# Patient Record
Sex: Male | Born: 1965 | Race: White | Hispanic: No | Marital: Married | Smoking: Never smoker
Health system: Southern US, Community
[De-identification: ages and names within clinical notes are randomized; demographics above are authoritative.]

## PROBLEM LIST (undated history)

## (undated) DIAGNOSIS — N200 Calculus of kidney: Secondary | ICD-10-CM

## (undated) DIAGNOSIS — I10 Essential (primary) hypertension: Secondary | ICD-10-CM

## (undated) DIAGNOSIS — R790 Abnormal level of blood mineral: Secondary | ICD-10-CM

## (undated) DIAGNOSIS — F5081 Binge eating disorder: Secondary | ICD-10-CM

## (undated) DIAGNOSIS — F909 Attention-deficit hyperactivity disorder, unspecified type: Secondary | ICD-10-CM

## (undated) DIAGNOSIS — E559 Vitamin D deficiency, unspecified: Secondary | ICD-10-CM

## (undated) DIAGNOSIS — F50819 Binge eating disorder, unspecified: Secondary | ICD-10-CM

## (undated) DIAGNOSIS — G4700 Insomnia, unspecified: Secondary | ICD-10-CM

## (undated) DIAGNOSIS — K219 Gastro-esophageal reflux disease without esophagitis: Secondary | ICD-10-CM

## (undated) DIAGNOSIS — E785 Hyperlipidemia, unspecified: Secondary | ICD-10-CM

## (undated) HISTORY — DX: Attention-deficit hyperactivity disorder, unspecified type: F90.9

## (undated) HISTORY — PX: WISDOM TOOTH EXTRACTION: SHX21

## (undated) HISTORY — DX: Abnormal level of blood mineral: R79.0

## (undated) HISTORY — DX: Essential (primary) hypertension: I10

## (undated) HISTORY — DX: Hyperlipidemia, unspecified: E78.5

## (undated) HISTORY — DX: Binge eating disorder, unspecified: F50.819

## (undated) HISTORY — DX: Vitamin D deficiency, unspecified: E55.9

## (undated) HISTORY — DX: Insomnia, unspecified: G47.00

## (undated) HISTORY — PX: WRIST SURGERY: SHX841

## (undated) HISTORY — DX: Gastro-esophageal reflux disease without esophagitis: K21.9

## (undated) HISTORY — DX: Binge eating disorder: F50.81

## (undated) HISTORY — DX: Calculus of kidney: N20.0

---

## 1988-03-27 DIAGNOSIS — F5081 Binge eating disorder: Secondary | ICD-10-CM | POA: Insufficient documentation

## 1998-01-31 ENCOUNTER — Emergency Department (HOSPITAL_COMMUNITY): Admission: EM | Admit: 1998-01-31 | Discharge: 1998-01-31 | Payer: Self-pay | Admitting: Internal Medicine

## 2000-03-22 ENCOUNTER — Encounter: Payer: Self-pay | Admitting: Family Medicine

## 2000-03-22 ENCOUNTER — Encounter: Admission: RE | Admit: 2000-03-22 | Discharge: 2000-03-22 | Payer: Self-pay | Admitting: Family Medicine

## 2000-05-15 ENCOUNTER — Ambulatory Visit (HOSPITAL_BASED_OUTPATIENT_CLINIC_OR_DEPARTMENT_OTHER): Admission: RE | Admit: 2000-05-15 | Discharge: 2000-05-15 | Payer: Self-pay | Admitting: Otolaryngology

## 2001-10-08 ENCOUNTER — Encounter: Payer: Self-pay | Admitting: Family Medicine

## 2001-10-08 ENCOUNTER — Encounter: Admission: RE | Admit: 2001-10-08 | Discharge: 2001-10-08 | Payer: Self-pay | Admitting: Family Medicine

## 2004-01-27 ENCOUNTER — Ambulatory Visit: Payer: Self-pay | Admitting: Family Medicine

## 2004-03-23 ENCOUNTER — Ambulatory Visit: Payer: Self-pay | Admitting: Family Medicine

## 2004-05-03 ENCOUNTER — Ambulatory Visit: Payer: Self-pay | Admitting: Family Medicine

## 2004-08-04 ENCOUNTER — Ambulatory Visit: Payer: Self-pay | Admitting: Family Medicine

## 2004-10-17 ENCOUNTER — Ambulatory Visit: Payer: Self-pay | Admitting: Family Medicine

## 2004-11-02 ENCOUNTER — Ambulatory Visit: Payer: Self-pay | Admitting: Family Medicine

## 2004-11-09 ENCOUNTER — Ambulatory Visit: Payer: Self-pay | Admitting: Family Medicine

## 2004-11-09 ENCOUNTER — Ambulatory Visit: Payer: Self-pay

## 2004-12-21 ENCOUNTER — Ambulatory Visit: Payer: Self-pay | Admitting: Family Medicine

## 2005-01-10 ENCOUNTER — Ambulatory Visit: Payer: Self-pay | Admitting: Family Medicine

## 2005-01-31 ENCOUNTER — Ambulatory Visit: Payer: Self-pay | Admitting: Family Medicine

## 2005-03-10 ENCOUNTER — Ambulatory Visit: Payer: Self-pay | Admitting: Family Medicine

## 2005-03-24 ENCOUNTER — Ambulatory Visit: Payer: Self-pay | Admitting: Family Medicine

## 2005-04-28 ENCOUNTER — Ambulatory Visit: Payer: Self-pay | Admitting: Family Medicine

## 2005-06-01 ENCOUNTER — Ambulatory Visit: Payer: Self-pay | Admitting: Family Medicine

## 2005-08-07 ENCOUNTER — Ambulatory Visit: Payer: Self-pay | Admitting: Family Medicine

## 2005-11-20 ENCOUNTER — Ambulatory Visit: Payer: Self-pay | Admitting: Family Medicine

## 2005-12-06 ENCOUNTER — Ambulatory Visit: Payer: Self-pay | Admitting: Family Medicine

## 2005-12-25 ENCOUNTER — Ambulatory Visit: Payer: Self-pay | Admitting: Family Medicine

## 2006-01-08 ENCOUNTER — Ambulatory Visit: Payer: Self-pay | Admitting: Internal Medicine

## 2006-02-07 ENCOUNTER — Ambulatory Visit: Payer: Self-pay | Admitting: Family Medicine

## 2006-02-07 LAB — CONVERTED CEMR LAB
ALT: 89 units/L — ABNORMAL HIGH (ref 0–40)
AST: 46 units/L — ABNORMAL HIGH (ref 0–37)
Albumin: 4 g/dL (ref 3.5–5.2)
Alkaline Phosphatase: 58 units/L (ref 39–117)
BUN: 12 mg/dL (ref 6–23)
CO2: 30 meq/L (ref 19–32)
Calcium: 9.2 mg/dL (ref 8.4–10.5)
Chloride: 105 meq/L (ref 96–112)
Creatinine, Ser: 1.3 mg/dL (ref 0.4–1.5)
GFR calc non Af Amer: 66 mL/min
Glomerular Filtration Rate, Af Am: 79 mL/min/{1.73_m2}
Glucose, Bld: 104 mg/dL — ABNORMAL HIGH (ref 70–99)
HCT: 50.2 % (ref 39.0–52.0)
Hemoglobin: 16.8 g/dL (ref 13.0–17.0)
MCHC: 33.5 g/dL (ref 30.0–36.0)
MCV: 90.8 fL (ref 78.0–100.0)
Platelets: 268 10*3/uL (ref 150–400)
Potassium: 3.8 meq/L (ref 3.5–5.1)
RBC: 5.53 M/uL (ref 4.22–5.81)
RDW: 12.1 % (ref 11.5–14.6)
Sodium: 143 meq/L (ref 135–145)
TSH: 1.16 microintl units/mL (ref 0.35–5.50)
Total Bilirubin: 0.8 mg/dL (ref 0.3–1.2)
Total Protein: 7.1 g/dL (ref 6.0–8.3)
WBC: 6.7 10*3/uL (ref 4.5–10.5)

## 2006-04-26 ENCOUNTER — Ambulatory Visit: Payer: Self-pay | Admitting: Family Medicine

## 2006-06-12 ENCOUNTER — Ambulatory Visit: Payer: Self-pay | Admitting: Family Medicine

## 2006-08-22 ENCOUNTER — Ambulatory Visit: Payer: Self-pay | Admitting: Family Medicine

## 2006-08-22 LAB — CONVERTED CEMR LAB
ALT: 73 units/L — ABNORMAL HIGH (ref 0–40)
AST: 44 units/L — ABNORMAL HIGH (ref 0–37)
Albumin: 4.3 g/dL (ref 3.5–5.2)
Alkaline Phosphatase: 57 units/L (ref 39–117)
BUN: 12 mg/dL (ref 6–23)
Basophils Absolute: 0 10*3/uL (ref 0.0–0.1)
Basophils Relative: 0.1 % (ref 0.0–1.0)
Bilirubin, Direct: 0.1 mg/dL (ref 0.0–0.3)
CO2: 26 meq/L (ref 19–32)
Calcium: 9.3 mg/dL (ref 8.4–10.5)
Chloride: 106 meq/L (ref 96–112)
Cholesterol: 190 mg/dL (ref 0–200)
Creatinine, Ser: 1.2 mg/dL (ref 0.4–1.5)
Eosinophils Absolute: 0.1 10*3/uL (ref 0.0–0.6)
Eosinophils Relative: 1.7 % (ref 0.0–5.0)
GFR calc Af Amer: 87 mL/min
GFR calc non Af Amer: 72 mL/min
Glucose, Bld: 108 mg/dL — ABNORMAL HIGH (ref 70–99)
HCT: 50 % (ref 39.0–52.0)
HDL: 35.7 mg/dL — ABNORMAL LOW (ref >39.0)
Hemoglobin: 17.1 g/dL — ABNORMAL HIGH (ref 13.0–17.0)
Hgb A1c MFr Bld: 6.5 % — ABNORMAL HIGH (ref 4.6–6.0)
LDL Cholesterol: 125 mg/dL — ABNORMAL HIGH (ref 0–99)
Lymphocytes Relative: 40.7 % (ref 12.0–46.0)
MCHC: 34.3 g/dL (ref 30.0–36.0)
MCV: 90.7 fL (ref 78.0–100.0)
Monocytes Absolute: 0.4 10*3/uL (ref 0.2–0.7)
Monocytes Relative: 7.9 % (ref 3.0–11.0)
Neutro Abs: 2.6 10*3/uL (ref 1.4–7.7)
Neutrophils Relative %: 49.6 % (ref 43.0–77.0)
Platelets: 206 10*3/uL (ref 150–400)
Potassium: 3.9 meq/L (ref 3.5–5.1)
RBC: 5.51 M/uL (ref 4.22–5.81)
RDW: 12.4 % (ref 11.5–14.6)
Sodium: 139 meq/L (ref 135–145)
TSH: 2.42 microintl units/mL (ref 0.35–5.50)
Total Bilirubin: 1.1 mg/dL (ref 0.3–1.2)
Total CHOL/HDL Ratio: 5.3
Total Protein: 7.5 g/dL (ref 6.0–8.3)
Triglycerides: 147 mg/dL (ref 0–149)
VLDL: 29 mg/dL (ref 0–40)
WBC: 5.3 10*3/uL (ref 4.5–10.5)

## 2006-08-29 ENCOUNTER — Ambulatory Visit: Payer: Self-pay | Admitting: Family Medicine

## 2006-08-29 DIAGNOSIS — H6990 Unspecified Eustachian tube disorder, unspecified ear: Secondary | ICD-10-CM | POA: Insufficient documentation

## 2006-08-29 DIAGNOSIS — Z8659 Personal history of other mental and behavioral disorders: Secondary | ICD-10-CM

## 2006-08-29 DIAGNOSIS — H6980 Other specified disorders of Eustachian tube, unspecified ear: Secondary | ICD-10-CM

## 2006-08-29 DIAGNOSIS — Z9189 Other specified personal risk factors, not elsewhere classified: Secondary | ICD-10-CM

## 2006-08-29 DIAGNOSIS — J45909 Unspecified asthma, uncomplicated: Secondary | ICD-10-CM | POA: Insufficient documentation

## 2006-10-17 ENCOUNTER — Telehealth: Payer: Self-pay | Admitting: *Deleted

## 2006-10-31 ENCOUNTER — Ambulatory Visit: Payer: Self-pay | Admitting: Family Medicine

## 2007-01-18 ENCOUNTER — Encounter: Payer: Self-pay | Admitting: Family Medicine

## 2007-02-20 ENCOUNTER — Ambulatory Visit: Payer: Self-pay | Admitting: Family Medicine

## 2007-02-20 DIAGNOSIS — J209 Acute bronchitis, unspecified: Secondary | ICD-10-CM

## 2007-04-22 ENCOUNTER — Telehealth: Payer: Self-pay | Admitting: Family Medicine

## 2007-04-24 ENCOUNTER — Telehealth: Payer: Self-pay | Admitting: Family Medicine

## 2007-05-01 ENCOUNTER — Ambulatory Visit: Payer: Self-pay | Admitting: Family Medicine

## 2007-05-01 DIAGNOSIS — I152 Hypertension secondary to endocrine disorders: Secondary | ICD-10-CM | POA: Insufficient documentation

## 2007-05-01 DIAGNOSIS — I10 Essential (primary) hypertension: Secondary | ICD-10-CM

## 2007-05-01 DIAGNOSIS — E785 Hyperlipidemia, unspecified: Secondary | ICD-10-CM | POA: Insufficient documentation

## 2007-05-02 LAB — CONVERTED CEMR LAB
ALT: 57 units/L — ABNORMAL HIGH (ref 0–53)
AST: 43 units/L — ABNORMAL HIGH (ref 0–37)
Albumin: 4.4 g/dL (ref 3.5–5.2)
Alkaline Phosphatase: 53 units/L (ref 39–117)
BUN: 10 mg/dL (ref 6–23)
Basophils Absolute: 0 10*3/uL (ref 0.0–0.1)
Basophils Relative: 0.1 % (ref 0.0–1.0)
Bilirubin, Direct: 0.2 mg/dL (ref 0.0–0.3)
CO2: 29 meq/L (ref 19–32)
Calcium: 9.5 mg/dL (ref 8.4–10.5)
Chloride: 107 meq/L (ref 96–112)
Cholesterol: 176 mg/dL (ref 0–200)
Creatinine, Ser: 1.2 mg/dL (ref 0.4–1.5)
Eosinophils Absolute: 0.1 10*3/uL (ref 0.0–0.6)
Eosinophils Relative: 2.6 % (ref 0.0–5.0)
GFR calc Af Amer: 87 mL/min
GFR calc non Af Amer: 72 mL/min
Glucose, Bld: 96 mg/dL (ref 70–99)
HCT: 49.6 % (ref 39.0–52.0)
HDL: 38 mg/dL — ABNORMAL LOW (ref >39.0)
Hemoglobin: 16.8 g/dL (ref 13.0–17.0)
Hgb A1c MFr Bld: 6.3 % — ABNORMAL HIGH (ref 4.6–6.0)
LDL Cholesterol: 125 mg/dL — ABNORMAL HIGH (ref 0–99)
Lymphocytes Relative: 33.9 % (ref 12.0–46.0)
MCHC: 33.8 g/dL (ref 30.0–36.0)
MCV: 90.6 fL (ref 78.0–100.0)
Monocytes Absolute: 0.4 10*3/uL (ref 0.2–0.7)
Monocytes Relative: 7.2 % (ref 3.0–11.0)
Neutro Abs: 2.9 10*3/uL (ref 1.4–7.7)
Neutrophils Relative %: 56.2 % (ref 43.0–77.0)
Platelets: 219 10*3/uL (ref 150–400)
Potassium: 4.5 meq/L (ref 3.5–5.1)
RBC: 5.47 M/uL (ref 4.22–5.81)
RDW: 12.2 % (ref 11.5–14.6)
Sodium: 144 meq/L (ref 135–145)
TSH: 1.29 microintl units/mL (ref 0.35–5.50)
Total Bilirubin: 1.1 mg/dL (ref 0.3–1.2)
Total CHOL/HDL Ratio: 4.6
Total Protein: 7.1 g/dL (ref 6.0–8.3)
Triglycerides: 67 mg/dL (ref 0–149)
VLDL: 13 mg/dL (ref 0–40)
WBC: 5.2 10*3/uL (ref 4.5–10.5)

## 2007-05-08 ENCOUNTER — Ambulatory Visit: Payer: Self-pay | Admitting: Family Medicine

## 2007-05-08 DIAGNOSIS — IMO0002 Reserved for concepts with insufficient information to code with codable children: Secondary | ICD-10-CM

## 2007-06-14 ENCOUNTER — Ambulatory Visit: Payer: Self-pay | Admitting: Family Medicine

## 2007-06-19 ENCOUNTER — Ambulatory Visit: Payer: Self-pay | Admitting: Family Medicine

## 2007-06-24 ENCOUNTER — Encounter: Payer: Self-pay | Admitting: Family Medicine

## 2007-08-16 ENCOUNTER — Ambulatory Visit: Payer: Self-pay | Admitting: Family Medicine

## 2007-08-16 DIAGNOSIS — K625 Hemorrhage of anus and rectum: Secondary | ICD-10-CM | POA: Insufficient documentation

## 2007-09-18 ENCOUNTER — Encounter: Payer: Self-pay | Admitting: Family Medicine

## 2007-11-13 ENCOUNTER — Telehealth: Payer: Self-pay | Admitting: Family Medicine

## 2008-01-06 ENCOUNTER — Telehealth: Payer: Self-pay | Admitting: Family Medicine

## 2008-01-24 ENCOUNTER — Encounter: Payer: Self-pay | Admitting: Family Medicine

## 2008-01-27 ENCOUNTER — Encounter: Admission: RE | Admit: 2008-01-27 | Discharge: 2008-01-27 | Payer: Self-pay | Admitting: Family Medicine

## 2008-01-27 ENCOUNTER — Ambulatory Visit: Payer: Self-pay | Admitting: Family Medicine

## 2008-01-27 DIAGNOSIS — N453 Epididymo-orchitis: Secondary | ICD-10-CM

## 2008-02-13 ENCOUNTER — Encounter: Payer: Self-pay | Admitting: Family Medicine

## 2008-04-28 ENCOUNTER — Ambulatory Visit: Payer: Self-pay | Admitting: Family Medicine

## 2008-05-14 ENCOUNTER — Ambulatory Visit: Payer: Self-pay | Admitting: Family Medicine

## 2008-05-22 ENCOUNTER — Telehealth: Payer: Self-pay | Admitting: Family Medicine

## 2008-05-28 ENCOUNTER — Ambulatory Visit: Payer: Self-pay | Admitting: Family Medicine

## 2008-06-08 ENCOUNTER — Ambulatory Visit: Payer: Self-pay | Admitting: Family Medicine

## 2008-06-11 ENCOUNTER — Ambulatory Visit: Payer: Self-pay | Admitting: Internal Medicine

## 2008-06-18 ENCOUNTER — Ambulatory Visit: Payer: Self-pay | Admitting: Family Medicine

## 2008-06-24 ENCOUNTER — Telehealth: Payer: Self-pay | Admitting: Family Medicine

## 2008-07-16 ENCOUNTER — Ambulatory Visit: Payer: Self-pay | Admitting: Internal Medicine

## 2008-08-06 ENCOUNTER — Ambulatory Visit: Payer: Self-pay | Admitting: Family Medicine

## 2008-08-07 LAB — CONVERTED CEMR LAB
ALT: 47 units/L (ref 0–53)
AST: 31 units/L (ref 0–37)
Albumin: 3.8 g/dL (ref 3.5–5.2)
Alkaline Phosphatase: 56 units/L (ref 39–117)
BUN: 7 mg/dL (ref 6–23)
Basophils Absolute: 0 10*3/uL (ref 0.0–0.1)
Basophils Relative: 0.4 % (ref 0.0–3.0)
Bilirubin Urine: NEGATIVE
Bilirubin, Direct: 0.2 mg/dL (ref 0.0–0.3)
CO2: 28 meq/L (ref 19–32)
Calcium: 9.1 mg/dL (ref 8.4–10.5)
Chloride: 107 meq/L (ref 96–112)
Cholesterol: 217 mg/dL — ABNORMAL HIGH (ref 0–200)
Creatinine, Ser: 1 mg/dL (ref 0.4–1.5)
Direct LDL: 167.3 mg/dL
Eosinophils Absolute: 0.1 10*3/uL (ref 0.0–0.7)
Eosinophils Relative: 2.4 % (ref 0.0–5.0)
GFR calc non Af Amer: 87.53 mL/min (ref >60)
Glucose, Bld: 110 mg/dL — ABNORMAL HIGH (ref 70–99)
HCT: 49.8 % (ref 39.0–52.0)
HDL: 39.1 mg/dL (ref >39.00)
Hemoglobin, Urine: NEGATIVE
Hemoglobin: 17 g/dL (ref 13.0–17.0)
Hgb A1c MFr Bld: 6.1 % (ref 4.6–6.5)
Ketones, ur: NEGATIVE mg/dL
Leukocytes, UA: NEGATIVE
Lymphocytes Relative: 32.8 % (ref 12.0–46.0)
Lymphs Abs: 1.7 10*3/uL (ref 0.7–4.0)
MCHC: 34.2 g/dL (ref 30.0–36.0)
MCV: 92.5 fL (ref 78.0–100.0)
Monocytes Absolute: 0.4 10*3/uL (ref 0.1–1.0)
Monocytes Relative: 7.5 % (ref 3.0–12.0)
Neutro Abs: 3.1 10*3/uL (ref 1.4–7.7)
Neutrophils Relative %: 56.9 % (ref 43.0–77.0)
Nitrite: NEGATIVE
PSA: 0.26 ng/mL (ref 0.10–4.00)
Platelets: 206 10*3/uL (ref 150.0–400.0)
Potassium: 3.8 meq/L (ref 3.5–5.1)
RBC: 5.39 M/uL (ref 4.22–5.81)
RDW: 12.9 % (ref 11.5–14.6)
Sodium: 142 meq/L (ref 135–145)
Specific Gravity, Urine: 1.02 (ref 1.000–1.030)
TSH: 1.78 microintl units/mL (ref 0.35–5.50)
Total Bilirubin: 1.2 mg/dL (ref 0.3–1.2)
Total CHOL/HDL Ratio: 6
Total Protein, Urine: NEGATIVE mg/dL
Total Protein: 6.9 g/dL (ref 6.0–8.3)
Triglycerides: 87 mg/dL (ref 0.0–149.0)
Urine Glucose: NEGATIVE mg/dL
Urobilinogen, UA: 0.2 (ref 0.0–1.0)
VLDL: 17.4 mg/dL (ref 0.0–40.0)
WBC: 5.3 10*3/uL (ref 4.5–10.5)
pH: 6 (ref 5.0–8.0)

## 2008-08-08 ENCOUNTER — Telehealth: Payer: Self-pay | Admitting: Family Medicine

## 2008-08-20 ENCOUNTER — Ambulatory Visit: Payer: Self-pay | Admitting: Family Medicine

## 2008-08-21 ENCOUNTER — Encounter: Payer: Self-pay | Admitting: Family Medicine

## 2008-08-31 ENCOUNTER — Telehealth: Payer: Self-pay | Admitting: Family Medicine

## 2008-11-02 ENCOUNTER — Ambulatory Visit: Payer: Self-pay | Admitting: Family Medicine

## 2008-11-02 DIAGNOSIS — M109 Gout, unspecified: Secondary | ICD-10-CM | POA: Insufficient documentation

## 2008-11-20 ENCOUNTER — Ambulatory Visit: Payer: Self-pay | Admitting: Family Medicine

## 2008-12-31 ENCOUNTER — Ambulatory Visit: Payer: Self-pay | Admitting: Family Medicine

## 2009-01-05 LAB — CONVERTED CEMR LAB
ALT: 31 units/L (ref 0–53)
AST: 24 units/L (ref 0–37)
Albumin: 4.1 g/dL (ref 3.5–5.2)
Alkaline Phosphatase: 54 units/L (ref 39–117)
Bilirubin, Direct: 0.1 mg/dL (ref 0.0–0.3)
Cholesterol: 227 mg/dL — ABNORMAL HIGH (ref 0–200)
Direct LDL: 176.6 mg/dL
HDL: 46.4 mg/dL (ref >39.00)
Hgb A1c MFr Bld: 5.9 % (ref 4.6–6.5)
Total Bilirubin: 1.1 mg/dL (ref 0.3–1.2)
Total CHOL/HDL Ratio: 5
Total Protein: 7.3 g/dL (ref 6.0–8.3)
Triglycerides: 73 mg/dL (ref 0.0–149.0)
Uric Acid, Serum: 8.6 mg/dL — ABNORMAL HIGH (ref 4.0–7.8)
VLDL: 14.6 mg/dL (ref 0.0–40.0)

## 2009-03-04 ENCOUNTER — Encounter: Admission: RE | Admit: 2009-03-04 | Discharge: 2009-03-24 | Payer: Self-pay | Admitting: Family Medicine

## 2009-03-10 ENCOUNTER — Encounter (INDEPENDENT_AMBULATORY_CARE_PROVIDER_SITE_OTHER): Payer: Self-pay | Admitting: *Deleted

## 2009-03-22 ENCOUNTER — Telehealth: Payer: Self-pay | Admitting: Family Medicine

## 2009-03-23 ENCOUNTER — Ambulatory Visit: Payer: Self-pay | Admitting: Family Medicine

## 2009-08-06 ENCOUNTER — Encounter: Payer: Self-pay | Admitting: Family Medicine

## 2009-08-06 DIAGNOSIS — E785 Hyperlipidemia, unspecified: Secondary | ICD-10-CM | POA: Insufficient documentation

## 2009-08-06 DIAGNOSIS — J4520 Mild intermittent asthma, uncomplicated: Secondary | ICD-10-CM | POA: Insufficient documentation

## 2009-08-06 DIAGNOSIS — E1169 Type 2 diabetes mellitus with other specified complication: Secondary | ICD-10-CM | POA: Insufficient documentation

## 2009-09-17 ENCOUNTER — Ambulatory Visit: Payer: Self-pay | Admitting: Family Medicine

## 2010-04-14 ENCOUNTER — Ambulatory Visit
Admission: RE | Admit: 2010-04-14 | Discharge: 2010-04-14 | Payer: Self-pay | Source: Home / Self Care | Attending: Family Medicine | Admitting: Family Medicine

## 2010-04-26 NOTE — Assessment & Plan Note (Signed)
Summary: COUGH, CONGESTION // RS   Vital Signs:  Patient profile:   45 year old male Weight:      303 pounds BMI:     39.04 O2 Sat:      95 % Temp:     98.7 degrees F oral BP sitting:   136 / 104  (left arm) Cuff size:   large  Vitals Entered By: Raechel Ache, RN (September 17, 2009 2:38 PM) CC: C/o cough and wheezing x 1 week. Also has [employer] form.   History of Present Illness: Here for several reasons, first for 3 days of chest congestion, PND, ST, and a dry cough. No fever. Second, he needs a form filled out for [employer] camp. Third, he needs refills. His asthma has been well controlled so far this summer, and he has not used his rescue inhaler at all. He started an exercise routine just 2 weeks ago with a friend in which they do hard walks for an hour and a half each day. He has changed his diet as well. Last month he had an episode of pounding in his chest and palpitations, and he was evaluated by Dr. Wonda Olds, a cardiologist in [place]. He had a treadmill ECHO which was negative for any ischemia, but he does show some moderate LVH. EF is 55-60%. He also had labs which were good for his A1c at 6.0 but not so good for his LDL at 157.   Allergies: 1)  ! Sulfamethoxazole-Tmp Ds (Sulfamethoxazole-Trimethoprim) 2)  ! Ace Inhibitors  Past History:  Past Medical History: Reviewed history from 11/02/2008 and no changes required. Asthma Hyperlipidemia Hypertension Diabetes mellitus, type II Gout  Review of Systems  The patient denies anorexia, fever, weight loss, weight gain, vision loss, decreased hearing, hoarseness, chest pain, syncope, dyspnea on exertion, peripheral edema, prolonged cough, headaches, hemoptysis, abdominal pain, melena, hematochezia, severe indigestion/heartburn, hematuria, incontinence, genital sores, muscle weakness, suspicious skin lesions, transient blindness, difficulty walking, depression, unusual weight change, abnormal bleeding, enlarged lymph  nodes, angioedema, breast masses, and testicular masses.    Physical Exam  General:  overweight-appearing.   Neck:  No deformities, masses, or tenderness noted. Lungs:  Normal respiratory effort, chest expands symmetrically. Lungs are clear to auscultation, no crackles or wheezes. Heart:  Normal rate and regular rhythm. S1 and S2 normal without gallop, murmur, click, rub or other extra sounds.   Impression & Recommendations:  Problem # 1:  DIABETES MELLITUS, TYPE II (ICD-250.00)  His updated medication list for this problem includes:    Diovan Hct 320-25 Mg Tabs (Valsartan-hydrochlorothiazide) ..... Once daily    Metformin Hcl 500 Mg Tabs (Metformin hcl) .Marland Kitchen..Marland Kitchen Two times a day  Problem # 2:  HYPERLIPIDEMIA (ICD-272.4)  His updated medication list for this problem includes:    Crestor 20 Mg Tabs (Rosuvastatin calcium) .Marland Kitchen... 1 tablet by mouth daily  Problem # 3:  HYPERTENSION (ICD-401.9)  His updated medication list for this problem includes:    Diovan Hct 320-25 Mg Tabs (Valsartan-hydrochlorothiazide) ..... Once daily  Problem # 4:  ASTHMA (ICD-493.90)  His updated medication list for this problem includes:    Albuterol Sulfate (2.5 Mg/64ml) 0.083% Nebu (Albuterol sulfate) ..... Use in nebulizer as needed    Proair Hfa 108 (90 Base) Mcg/act Aers (Albuterol sulfate) .Marland Kitchen... 2 inh q4h as needed shortness of breath  Problem # 5:  ATTENTION DEFICIT HYPERACTIVITY DISORDER, HX OF (ICD-V11.8)  Problem # 6:  VIRAL URI (ICD-465.9)  His updated medication list for this  problem includes:    Tussionex Pennkinetic Er 8-10 Mg/89ml Lqcr (Chlorpheniramine-hydrocodone) .Marland Kitchen... 1 tsp two times a day as needed cough  Complete Medication List: 1)  Temazepam 30 Mg Caps (Temazepam) .... Take 1 capsule by mouth at bedtime as needed 2)  Diovan Hct 320-25 Mg Tabs (Valsartan-hydrochlorothiazide) .... Once daily 3)  Albuterol Sulfate (2.5 Mg/75ml) 0.083% Nebu (Albuterol sulfate) .... Use in nebulizer as  needed 4)  Proair Hfa 108 (90 Base) Mcg/act Aers (Albuterol sulfate) .... 2 inh q4h as needed shortness of breath 5)  Flonase 50 Mcg/act Susp (Fluticasone propionate) .... 2 sprays once daily each nostril 6)  Adderall Xr 20 Mg Xr24h-cap (Amphetamine-dextroamphetamine) .... Once daily 7)  Crestor 20 Mg Tabs (Rosuvastatin calcium) .Marland Kitchen.. 1 tablet by mouth daily 8)  Metformin Hcl 500 Mg Tabs (Metformin hcl) .... Two times a day 9)  Tussionex Pennkinetic Er 8-10 Mg/20ml Lqcr (Chlorpheniramine-hydrocodone) .Marland Kitchen.. 1 tsp two times a day as needed cough  Patient Instructions: 1)  Rest, Mucinex, fluids as needed for the viral URI. Encouraged him to continue with the diet and exercise efforts. Forms were filled out.  2)  Please schedule a follow-up appointment in 6 months .  Prescriptions: ADDERALL XR 20 MG XR24H-CAP (AMPHETAMINE-DEXTROAMPHETAMINE) once daily  #90 x 0   Entered and Authorized by:   Nelwyn Salisbury MD   Signed by:   Nelwyn Salisbury MD on 09/17/2009   Method used:   Print then Give to Patient   RxID:   [id number] METFORMIN HCL 500 MG TABS (METFORMIN HCL) two times a day  #180 x 3   Entered and Authorized by:   Nelwyn Salisbury MD   Signed by:   Nelwyn Salisbury MD on 09/17/2009   Method used:   Print then Give to Patient   RxID:   [id number] CRESTOR 20 MG TABS (ROSUVASTATIN CALCIUM) 1 tablet by mouth daily  #90 x 3   Entered and Authorized by:   Nelwyn Salisbury MD   Signed by:   Nelwyn Salisbury MD on 09/17/2009   Method used:   Print then Give to Patient   RxID:   [id number] FLONASE 50 MCG/ACT SUSP (FLUTICASONE PROPIONATE) 2 sprays once daily each nostril  #90 x 3   Entered and Authorized by:   Nelwyn Salisbury MD   Signed by:   Nelwyn Salisbury MD on 09/17/2009   Method used:   Print then Give to Patient   RxID:   [id number] PROAIR HFA 108 (90 BASE) MCG/ACT  AERS (ALBUTEROL SULFATE) 2 inh q4h as needed shortness of breath  #3 x 3   Entered and Authorized by:   Nelwyn Salisbury MD    Signed by:   Nelwyn Salisbury MD on 09/17/2009   Method used:   Print then Give to Patient   RxID:   [id number] DIOVAN HCT 320-25 MG  TABS (VALSARTAN-HYDROCHLOROTHIAZIDE) once daily  #90 x 3   Entered and Authorized by:   Nelwyn Salisbury MD   Signed by:   Nelwyn Salisbury MD on 09/17/2009   Method used:   Print then Give to Patient   RxID:   [id number] TEMAZEPAM 30 MG  CAPS (TEMAZEPAM) Take 1 capsule by mouth at bedtime as needed  #30 x 0   Entered and Authorized by:   Nelwyn Salisbury MD   Signed by:   Nelwyn Salisbury MD on 09/17/2009   Method used:   Print then Give to Patient  RxID:   [id number]

## 2010-04-26 NOTE — Consult Note (Signed)
Summary: [facility]  [facility]   Imported By: Maryln Gottron 08/11/2009 13:54:51  _____________________________________________________________________  External Attachment:    Type:   Image     Comment:   External Document

## 2010-04-26 NOTE — Letter (Signed)
Summary: Physical Examination for [employer] Particip[employer]  Physical Examination for [employer] Particip[employer]   Imported By: Maryln Gottron 09/23/2009 11:18:59  _____________________________________________________________________  External Attachment:    Type:   Image     Comment:   External Document

## 2010-04-28 NOTE — Assessment & Plan Note (Signed)
Summary: sore throat//ccm   Vital Signs:  Patient profile:   45 year old male Weight:      299 pounds O2 Sat:      98 % Temp:     98.8 degrees F Pulse rate:   82 / minute BP sitting:   120 / 84  (left arm) Cuff size:   large  Vitals Entered By: Pura Spice, RN (April 14, 2010 9:51 AM) CC: sore throat earache nasl congestion    History of Present Illness: here for one wek of sinus pressure, HA, PND, ST, and dry cough. No fever.   Allergies: 1)  ! Sulfamethoxazole-Tmp Ds (Sulfamethoxazole-Trimethoprim) 2)  ! Ace Inhibitors  Past History:  Past Medical History: Reviewed history from 11/02/2008 and no changes required. Asthma Hyperlipidemia Hypertension Diabetes mellitus, type II Gout  Review of Systems  The patient denies anorexia, fever, weight loss, weight gain, vision loss, decreased hearing, hoarseness, chest pain, syncope, dyspnea on exertion, peripheral edema, hemoptysis, abdominal pain, melena, hematochezia, severe indigestion/heartburn, hematuria, incontinence, genital sores, muscle weakness, suspicious skin lesions, transient blindness, difficulty walking, depression, unusual weight change, abnormal bleeding, enlarged lymph nodes, angioedema, breast masses, and testicular masses.    Physical Exam  General:  Well-developed,well-nourished,in no acute distress; alert,appropriate and cooperative throughout examination Head:  Normocephalic and atraumatic without obvious abnormalities. No apparent alopecia or balding. Eyes:  No corneal or conjunctival inflammation noted. EOMI. Perrla. Funduscopic exam benign, without hemorrhages, exudates or papilledema. Vision grossly normal. Ears:  External ear exam shows no significant lesions or deformities.  Otoscopic examination reveals clear canals, tympanic membranes are intact bilaterally without bulging, retraction, inflammation or discharge. Hearing is grossly normal bilaterally. Nose:  External nasal examination shows no  deformity or inflammation. Nasal mucosa are pink and moist without lesions or exudates. Mouth:  Oral mucosa and oropharynx without lesions or exudates.  Teeth in good repair. Neck:  No deformities, masses, or tenderness noted. Lungs:  Normal respiratory effort, chest expands symmetrically. Lungs are clear to auscultation, no crackles or wheezes.   Impression & Recommendations:  Problem # 1:  SINUSITIS, ACUTE NOS (ICD-461.9)  His updated medication list for this problem includes:    Flonase 50 Mcg/act Susp (Fluticasone propionate) .Marland Kitchen... 2 sprays once daily each nostril    Tussionex Pennkinetic Er 8-10 Mg/62ml Lqcr (Chlorpheniramine-hydrocodone) .Marland Kitchen... 1 tsp two times a day as needed cough    Zithromax Z-pak 250 Mg Tabs (Azithromycin) .Marland Kitchen... As directed  Complete Medication List: 1)  Temazepam 30 Mg Caps (Temazepam) .... Take 1 capsule by mouth at bedtime as needed 2)  Diovan Hct 320-25 Mg Tabs (Valsartan-hydrochlorothiazide) .... Once daily 3)  Albuterol Sulfate (2.5 Mg/37ml) 0.083% Nebu (Albuterol sulfate) .... Use in nebulizer as needed 4)  Proair Hfa 108 (90 Base) Mcg/act Aers (Albuterol sulfate) .... 2 inh q4h as needed shortness of breath 5)  Flonase 50 Mcg/act Susp (Fluticasone propionate) .... 2 sprays once daily each nostril 6)  Crestor 20 Mg Tabs (Rosuvastatin calcium) .Marland Kitchen.. 1 tablet by mouth daily 7)  Metformin Hcl 500 Mg Tabs (Metformin hcl) .... Two times a day 8)  Tussionex Pennkinetic Er 8-10 Mg/16ml Lqcr (Chlorpheniramine-hydrocodone) .Marland Kitchen.. 1 tsp two times a day as needed cough 9)  Zithromax Z-pak 250 Mg Tabs (Azithromycin) .... As directed  Patient Instructions: 1)  Please schedule a follow-up appointment as needed .  Prescriptions: TUSSIONEX PENNKINETIC ER 8-10 MG/5ML LQCR (CHLORPHENIRAMINE-HYDROCODONE) 1 tsp two times a day as needed cough  #240 x 0   Entered and Authorized  by:   Nelwyn Salisbury MD   Signed by:   Nelwyn Salisbury MD on 04/14/2010   Method used:   Print then Give  to Patient   RxID:   [id number] Christena Deem Z-PAK 250 MG TABS (AZITHROMYCIN) as directed  #1 x 0   Entered and Authorized by:   Nelwyn Salisbury MD   Signed by:   Nelwyn Salisbury MD on 04/14/2010   Method used:   Electronically to        [facility]* (retail)       [address]       [place], [place]  [address]       Ph: [phone]       Fax: [phone]   RxID:   [id number] CRESTOR 20 MG TABS (ROSUVASTATIN CALCIUM) 1 tablet by mouth daily  #30 x 11   Entered and Authorized by:   Nelwyn Salisbury MD   Signed by:   Nelwyn Salisbury MD on 04/14/2010   Method used:   Electronically to        [facility]* (retail)       [address]       [place], [place]  [address]       Ph: [phone]       Fax: [phone]   RxID:   [id number]    Orders Added: 1)  Est. Patient Level IV [61607]

## 2010-06-15 ENCOUNTER — Encounter: Payer: Self-pay | Admitting: Internal Medicine

## 2010-06-15 ENCOUNTER — Ambulatory Visit (INDEPENDENT_AMBULATORY_CARE_PROVIDER_SITE_OTHER): Payer: PRIVATE HEALTH INSURANCE | Admitting: Internal Medicine

## 2010-06-15 VITALS — BP 120/80 | HR 96 | Temp 98.7°F | Wt 310.0 lb

## 2010-06-15 DIAGNOSIS — J45909 Unspecified asthma, uncomplicated: Secondary | ICD-10-CM

## 2010-06-15 DIAGNOSIS — J45901 Unspecified asthma with (acute) exacerbation: Secondary | ICD-10-CM

## 2010-06-15 DIAGNOSIS — J019 Acute sinusitis, unspecified: Secondary | ICD-10-CM

## 2010-06-15 MED ORDER — CHLORPHENIRAMINE-HYDROCODONE 8-10 MG/5ML PO LQCR: 5.0000 mL | Freq: Two times a day (BID) | ORAL | Status: DC | PRN

## 2010-06-15 MED ORDER — AZITHROMYCIN 250 MG PO TABS: 250.0000 mg | ORAL_TABLET | ORAL | Status: AC

## 2010-06-15 MED ORDER — ALBUTEROL SULFATE HFA 108 (90 BASE) MCG/ACT IN AERS: 2.0000 | INHALATION_SPRAY | Freq: Four times a day (QID) | RESPIRATORY_TRACT | Status: DC | PRN

## 2010-06-15 NOTE — Patient Instructions (Signed)
Albuterol as needed Add  Antibiotic    Expect improvement in the next  5 days .

## 2010-06-15 NOTE — Progress Notes (Signed)
  Subjective:    Patient ID: Zachary [name], male    DOB: Mar 14, 1966, 45 y.o.   MRN: [id number]  HPI  patient comes in today for acute visit. He has had a respiratory infection for about 10 days and is not getting better. He began with upper respiratory congestion and then cough and today he thought he might have been wheezing. He has some discolored phlegm and headache. He has no fever but his head is very congested.    His pattern is persistent and progressive. He has a remote history of using a Proventil inhaler in the past and needs a refill.using distilled water  For irrigation.  past medical history and allergies reviewed  Review of Systems  negative chest pain or shortness of breath no fever rigors  chills nausea vomiting diarrhea. Rest non contributory     Objective:   Physical Exam  well-developed well-nourished in no acute distress with some obvious congestion and deep bronchial cough. HEENT: Normocephalic ;atraumatic , Eyes;  PERRL, EOMs  Full, lids and conjunctiva clear,,Ears: no deformities, canals nl, TM landmarks normal, Nose:  Mucoid discharge face nontender  Mouth : OP clear without lesion or edema . Neck supple  Ant tender nodes  Chest:  Clear to A&P without wheezes rales or rhonchi   But prolonged expiratory sounds possible wheezing CV:  S1-S2 no gallops or murmurs peripheral perfusion is normal   normal extremity perfusion   Assessment & Plan:   prolonged respiratory tract infection and possible sinusitis bronchitis with reactive airways will do empiric treatment with the bronchodilator hands antibiotic empirically expect improvement within 5 days the cough may last longer.

## 2010-08-09 NOTE — Assessment & Plan Note (Signed)
[facility] OFFICE NOTE   NAME:Zachary [name], Zachary [name]                       MRN:          [id number]  DATE:08/29/2006                            DOB:          Aug 04, 1965    This is a 45 year old gentleman here for a complete physical  examination. Generally he is doing well and has no particular  complaints. He is tired and anxious because of his schedule and some  family issues but he feels that the Zoloft he is on is working well. He  is averaging between 70 and 80 hours a week working with a lot of  overtime. His asthma has remained under very good control. He very  rarely needs to use his albuterol inhaler. Over the past year, we had  worked him up for a couple of syncopal spells which we felt were  vasovagal in nature. He has had none at all for the past 6 months. Of  note, he did have a normal cardiac stress test on November 09, 2004. He  has not had a gout attack in over a year. His blood pressure remains  normal and he is sleeping well with the use of temazepam. For further  details of his past medial history, family history, social history,  habits, etc., I refer you to our last physical note dated Aug 18, 2003.   ALLERGIES:  SULFA causes a rash and ACE INHIBITORS cause a cough.   CURRENT MEDICATIONS:  1. Vytorin 10/40 once a day.  2. Maxzide 75/50 once a day.  3. Advair 250/50 one puff b.i.d.  4. Zoloft 100 mg per day.  5. Temazepam 30 mg q.h.s.  6. Albuterol as needed.   OBJECTIVE:  VITAL SIGNS:  Height 6 foot 3 inches, weight 303, blood  pressure 114/88, pulse 76 and regular. He remains overweight.  SKIN:  Clear.  HEENT:  Eyes clear, ears clear, pharynx clear.  NECK:  Supple without lymphadenopathy or masses.  LUNGS:  Clear.  CARDIAC:  Rate and rhythm regular without gallops, murmurs or rubs.  Distal pulses full.  ABDOMEN:  Soft, normal bowel sounds, nontender, no masses.  GENITALIA:  Normal male. He  is circumcised.  EXTREMITIES:  No clubbing, cyanosis or edema.  NEUROLOGIC:  Grossly intact.   He was here for fasting labs on May 28. These were remarkable for  abnormal lipid panel as usual. HDL was low at 35, LDL was high at 125.  His liver enzymes remain mildly elevated at 44 and 73 which is due to  fatty liver infiltration. One new problems includes an elevated fasting  glucose to 108 and an elevated hemoglobin A1c to 6.5.   ASSESSMENT/PLAN:  1. Complete physical. We talked about increasing exercise and losing      weight, it is more important now than ever with a new diagnosis of      diabetes as noted below.  2. Hyperlipidemia. Will increase Vytorin to 10/80 once a day and check      a lipid panel again in 3 months.  3. Gout stable.  4. Hypertension.  Will stop Maxzide and switch to Diovan HCT 160/12.5      to take once daily. He will follow his blood pressures at work and      I will see him back for followup in 3 months.  5. New onset type 2 diabetes mellitus. I think this could be very      adequately controlled with diet and weight loss alone. I wrote for      him to have his own Glucometer to check his sugars several times a      week at home. Will change his hypertension medications around as      above so that we can have an angiotensin receptor blocker in his      regimen. Will check a microalbumin level in his urine at our next      visit. I plan to followup with him in 3 months.  6. Asthma stable.  7. Anxiety stable. I refilled Zoloft for the coming year.  8. Insomnia stable. I refilled temazepam for the coming year.     Tera Mater. Clent Ridges, MD  Electronically Signed    SAF/MedQ  DD: 08/29/2006  DT: 08/29/2006  Job #: [id number]

## 2010-10-12 ENCOUNTER — Ambulatory Visit (INDEPENDENT_AMBULATORY_CARE_PROVIDER_SITE_OTHER): Payer: PRIVATE HEALTH INSURANCE | Admitting: Family Medicine

## 2010-10-12 ENCOUNTER — Encounter: Payer: Self-pay | Admitting: Family Medicine

## 2010-10-12 VITALS — BP 130/90 | HR 98 | Temp 99.1°F | Wt 314.0 lb

## 2010-10-12 DIAGNOSIS — W57XXXA Bitten or stung by nonvenomous insect and other nonvenomous arthropods, initial encounter: Secondary | ICD-10-CM

## 2010-10-12 DIAGNOSIS — T148XXA Other injury of unspecified body region, initial encounter: Secondary | ICD-10-CM

## 2010-10-12 DIAGNOSIS — L989 Disorder of the skin and subcutaneous tissue, unspecified: Secondary | ICD-10-CM

## 2010-10-12 MED ORDER — OMEPRAZOLE 40 MG PO CPDR: 40.0000 mg | DELAYED_RELEASE_CAPSULE | Freq: Every day | ORAL | Status: DC

## 2010-10-12 NOTE — Progress Notes (Signed)
  Subjective:    Patient ID: Zachary [name], male    DOB: 1966-01-16, 45 y.o.   MRN: [id number]  HPI Here for 2 reasons. First he had a tick on the lower back 2 weeks ago, and his wife pulled it off with tweezers. Apparently the head broke off in his skin, and he has had a red knot there ever since. It is not tender, and he has felt fine in general. Second, several months ago he noticed a large lesion come up on the chest which worries him. It is asymptomatic.   Review of Systems  Constitutional: Negative.        Objective:   Physical Exam  Constitutional: He appears well-developed and well-nourished.  Skin:       There is a red firm non-tender papular lesion on the lower back. There is a flat raised non-pigmented lesion on the right chest about 1 cm in diameter          Assessment & Plan:  The area of the tick bite has developed some granulation tissue around it, which is benign. This should go away over time. The chest lesion is a bit worrisome. This could be a non-pigmented seborrheic keratosis vs a basal cell cancer vs a non-melanotic melanoma. We will refer him to Dermatology for this.

## 2010-10-13 ENCOUNTER — Encounter: Payer: Self-pay | Admitting: Family Medicine

## 2010-10-17 ENCOUNTER — Encounter: Payer: Self-pay | Admitting: Family Medicine

## 2010-12-15 ENCOUNTER — Telehealth: Payer: Self-pay

## 2010-12-20 NOTE — Telephone Encounter (Signed)
Error. closing

## 2011-01-03 ENCOUNTER — Encounter: Payer: Self-pay | Admitting: Family Medicine

## 2011-01-03 ENCOUNTER — Ambulatory Visit (INDEPENDENT_AMBULATORY_CARE_PROVIDER_SITE_OTHER): Payer: PRIVATE HEALTH INSURANCE | Admitting: Family Medicine

## 2011-01-03 VITALS — BP 136/88 | HR 87 | Temp 98.0°F | Wt 314.0 lb

## 2011-01-03 DIAGNOSIS — I8390 Asymptomatic varicose veins of unspecified lower extremity: Secondary | ICD-10-CM

## 2011-01-03 NOTE — Progress Notes (Signed)
  Subjective:    Patient ID: Zachary [name], male    DOB: 1965-12-13, 45 y.o.   MRN: [id number]  HPI Here to check a bleeding lesion on the scrotum which started bleeding 3 days ago. No pain or hx of trauma.    Review of Systems  Constitutional: Negative.   Genitourinary: Negative.        Objective:   Physical Exam  Constitutional: He appears well-developed and well-nourished.  Genitourinary:       The scrotum has a number of small varicosities over the surface. The spot in question is one such varicosity on the left side of the scrotum. This is not bleeding and it has a small scab on top of it          Assessment & Plan:  This seems to have resolved itself. I advised him to keep it covered with a Telfa pad for one week to avoid knocking the scab off again. Recheck prn

## 2011-02-12 ENCOUNTER — Emergency Department (INDEPENDENT_AMBULATORY_CARE_PROVIDER_SITE_OTHER)
Admission: EM | Admit: 2011-02-12 | Discharge: 2011-02-12 | Disposition: A | Discharge status: Home / Self Care | Payer: PRIVATE HEALTH INSURANCE | Source: Home / Self Care | Attending: Emergency Medicine | Admitting: Emergency Medicine

## 2011-02-12 DIAGNOSIS — R059 Cough, unspecified: Secondary | ICD-10-CM

## 2011-02-12 DIAGNOSIS — J069 Acute upper respiratory infection, unspecified: Secondary | ICD-10-CM

## 2011-02-12 DIAGNOSIS — R05 Cough: Secondary | ICD-10-CM

## 2011-02-12 MED ORDER — PREDNISONE (PAK) 10 MG PO TABS: 10.0000 mg | ORAL_TABLET | Freq: Every day | ORAL | Status: AC

## 2011-02-12 MED ORDER — ALBUTEROL SULFATE HFA 108 (90 BASE) MCG/ACT IN AERS: 1.0000 | INHALATION_SPRAY | Freq: Four times a day (QID) | RESPIRATORY_TRACT | Status: DC | PRN

## 2011-02-12 MED ORDER — CLARITHROMYCIN 500 MG PO TABS: 500.0000 mg | ORAL_TABLET | Freq: Two times a day (BID) | ORAL | Status: DC

## 2011-02-12 MED ORDER — HYDROCOD POLST-CHLORPHEN POLST 10-8 MG/5ML PO LQCR: 5.0000 mL | Freq: Two times a day (BID) | ORAL | Status: DC

## 2011-02-12 NOTE — ED Provider Notes (Signed)
History     CSN: [id number] Arrival date & time: 02/12/2011 12:16 PM   First MD Initiated Contact with Patient 02/12/11 1221      Chief Complaint  Patient presents with  . URI  . Wheezing    (Consider location/radiation/quality/duration/timing/severity/associated sxs/prior treatment) HPI Zachary [name] is a 45 y.o. male who complains of onset of cold symptoms for 2  days.  he is using OTC meds which helps a little bit.  He is a ER nurse. + sore throat + cough No pleuritic pain No wheezing + nasal congestion + post-nasal drainage + sinus pain/pressure No chest congestion No itchy/red eyes No earache No hemoptysis No SOB No chills/sweats + fever No nausea No vomiting No abdominal pain No diarrhea No skin rashes + fatigue + myalgias + headache     Past Medical History  Diagnosis Date  . Asthma   . Hyperlipidemia   . Hypertension   . Diabetes mellitus   . Gout     History reviewed. No pertinent past surgical history.  Family History  Problem Relation Age of Onset  . Breast cancer Mother     History  Substance Use Topics  . Smoking status: Never Smoker   . Smokeless tobacco: Never Used  . Alcohol Use: 0.0 oz/week      Review of Systems  Allergies  Ace inhibitors and Sulfamethoxazole w/trimethoprim  Home Medications   Current Outpatient Rx  Name Route Sig Dispense Refill  . ALBUTEROL SULFATE HFA 108 (90 BASE) MCG/ACT IN AERS Inhalation Inhale 2 puffs into the lungs every 6 (six) hours as needed for wheezing. 1 Inhaler 2    Dispense   With spacer  . ALBUTEROL SULFATE HFA 108 (90 BASE) MCG/ACT IN AERS Inhalation Inhale 1-2 puffs into the lungs every 6 (six) hours as needed for wheezing. 1 Inhaler 0  . ALBUTEROL SULFATE HFA 108 (90 BASE) MCG/ACT IN AERS Inhalation Inhale 1-2 puffs into the lungs every 6 (six) hours as needed for wheezing. 1 Inhaler 1  . ALBUTEROL SULFATE (2.5 MG/3ML) 0.083% IN NEBU Nebulization Take 2.5 mg by nebulization every 6 (six)  hours as needed.      Marland Kitchen HYDROCOD POLST-CHLORPHEN POLST 10-8 MG/5ML PO LQCR Oral Take 5 mLs by mouth every 12 (twelve) hours. 90 mL 0  . CHLORPHENIRAMINE-HYDROCODONE 8-10 MG/5ML PO LQCR Oral Take 5 mLs by mouth every 12 (twelve) hours as needed for cough. 90 mL 0  . CLARITHROMYCIN 500 MG PO TABS Oral Take 1 tablet (500 mg total) by mouth 2 (two) times daily. 20 tablet 0  . FLUTICASONE PROPIONATE 50 MCG/ACT NA SUSP Nasal 2 sprays by Nasal route daily.      Marland Kitchen METFORMIN HCL 500 MG PO TABS Oral Take 500 mg by mouth 2 (two) times daily with a meal.      . OMEPRAZOLE 40 MG PO CPDR Oral Take 1 capsule (40 mg total) by mouth daily. 30 capsule 11  . PREDNISONE (PAK) 10 MG PO TABS Oral Take 1 tablet (10 mg total) by mouth daily. 6 day pack, use as directed 1 tablet 0  . ROSUVASTATIN CALCIUM 20 MG PO TABS Oral Take 20 mg by mouth daily. Take 1/2 tablet    . TEMAZEPAM 30 MG PO CAPS Oral Take 30 mg by mouth at bedtime as needed.      Marland Kitchen VALSARTAN-HYDROCHLOROTHIAZIDE 320-25 MG PO TABS Oral Take 1 tablet by mouth daily.        BP 157/118  Pulse 95  Temp(Src) 98.5  F (36.9 C) (Oral)  Resp 24  Wt 318 lb (144.244 kg)  SpO2 98%  Physical Exam  Nursing note and vitals reviewed. Constitutional: He is oriented to person, place, and time. He appears well-developed and well-nourished.  HENT:  Head: Normocephalic and atraumatic.  Right Ear: Tympanic membrane, external ear and ear canal normal.  Left Ear: Tympanic membrane, external ear and ear canal normal.  Nose: Mucosal edema and rhinorrhea present.  Mouth/Throat: Posterior oropharyngeal erythema present. No oropharyngeal exudate or posterior oropharyngeal edema.  Neck: Neck supple.  Cardiovascular: Regular rhythm and normal heart sounds.   Pulmonary/Chest: Effort normal and breath sounds normal. No respiratory distress.  Neurological: He is alert and oriented to person, place, and time.  Skin: Skin is warm and dry.  Psychiatric: He has a normal mood and  affect. His speech is normal.    ED Course  Procedures (including critical care time)   Labs Reviewed  POCT INFLUENZA A/B   No results found.   1. Acute upper respiratory infections of unspecified site   2. Cough       MDM   1)  Take the prescribed antibiotic as instructed.  Rapid flu test is negative. 2)  Use nasal saline solution (over the counter) at least 3 times a day. 3)  Use over the counter decongestants like Zyrtec-D every 12 hours as needed to help with congestion.  If you have hypertension, do not take medicines with sudafed.  4)  Can take tylenol every 6 hours or motrin every 8 hours for pain or fever. 5)  Follow up with your primary doctor if no improvement in 5-7 days, sooner if increasing pain, fever, or new symptoms.       Lily Kocher, MD 02/12/11 1242

## 2011-02-12 NOTE — ED Notes (Signed)
States symptoms started yesterday, noted with HA, muscle and joint stiffness

## 2011-02-20 ENCOUNTER — Encounter: Payer: Self-pay | Admitting: Family Medicine

## 2011-02-20 ENCOUNTER — Ambulatory Visit (INDEPENDENT_AMBULATORY_CARE_PROVIDER_SITE_OTHER): Payer: PRIVATE HEALTH INSURANCE | Admitting: Family Medicine

## 2011-02-20 VITALS — BP 132/84 | HR 87 | Temp 98.6°F | Wt 319.0 lb

## 2011-02-20 DIAGNOSIS — J40 Bronchitis, not specified as acute or chronic: Secondary | ICD-10-CM

## 2011-02-20 DIAGNOSIS — J45901 Unspecified asthma with (acute) exacerbation: Secondary | ICD-10-CM

## 2011-02-20 MED ORDER — ALBUTEROL SULFATE (2.5 MG/3ML) 0.083% IN NEBU: 2.5000 mg | INHALATION_SOLUTION | Freq: Four times a day (QID) | RESPIRATORY_TRACT | Status: DC | PRN

## 2011-02-20 MED ORDER — FLUTICASONE-SALMETEROL 250-50 MCG/DOSE IN AEPB: 1.0000 | INHALATION_SPRAY | Freq: Two times a day (BID) | RESPIRATORY_TRACT | Status: DC

## 2011-02-20 NOTE — Progress Notes (Signed)
  Subjective:    Patient ID: Zachary [name], male    DOB: [birth year], 45 y.o.   MRN: [id number]  HPI Here for a URI that he has had for 2 weeks. He has chest tightness and a dry cough. No fever. He saw Urgent Care last week and was given some Prednisone and a course of Biaxin. He only started the Biaxin yesterday however.    Review of Systems  Constitutional: Negative.   HENT: Negative.   Eyes: Negative.   Respiratory: Positive for cough.        Objective:   Physical Exam  Constitutional: He appears well-developed and well-nourished.  HENT:  Right Ear: External ear normal.  Left Ear: External ear normal.  Nose: Nose normal.  Mouth/Throat: Oropharynx is clear and moist. No oropharyngeal exudate.  Eyes: Conjunctivae are normal.  Neck: No thyromegaly present.  Pulmonary/Chest: Effort normal. He has wheezes. He has no rales.  Lymphadenopathy:    He has no cervical adenopathy.          Assessment & Plan:  Go ahead and take the Biaxin. Get back on Advair bid.

## 2011-11-17 ENCOUNTER — Encounter: Payer: Self-pay | Admitting: Family Medicine

## 2011-11-17 ENCOUNTER — Ambulatory Visit (INDEPENDENT_AMBULATORY_CARE_PROVIDER_SITE_OTHER): Payer: PRIVATE HEALTH INSURANCE | Admitting: Family Medicine

## 2011-11-17 VITALS — BP 140/100 | HR 90 | Temp 98.5°F | Wt 319.0 lb

## 2011-11-17 DIAGNOSIS — M109 Gout, unspecified: Secondary | ICD-10-CM

## 2011-11-17 DIAGNOSIS — E785 Hyperlipidemia, unspecified: Secondary | ICD-10-CM

## 2011-11-17 DIAGNOSIS — E119 Type 2 diabetes mellitus without complications: Secondary | ICD-10-CM

## 2011-11-17 DIAGNOSIS — N529 Male erectile dysfunction, unspecified: Secondary | ICD-10-CM

## 2011-11-17 DIAGNOSIS — Z79899 Other long term (current) drug therapy: Secondary | ICD-10-CM

## 2011-11-17 DIAGNOSIS — I10 Essential (primary) hypertension: Secondary | ICD-10-CM

## 2011-11-17 LAB — CBC WITH DIFFERENTIAL/PLATELET
Basophils Absolute: 0 10*3/uL (ref 0.0–0.1)
Basophils Relative: 0.4 % (ref 0.0–3.0)
Eosinophils Absolute: 0.1 10*3/uL (ref 0.0–0.7)
Eosinophils Relative: 1.9 % (ref 0.0–5.0)
HCT: 49.7 % (ref 39.0–52.0)
Hemoglobin: 16.7 g/dL (ref 13.0–17.0)
Lymphocytes Relative: 29.5 % (ref 12.0–46.0)
Lymphs Abs: 1.5 10*3/uL (ref 0.7–4.0)
MCHC: 33.6 g/dL (ref 30.0–36.0)
MCV: 91.3 fl (ref 78.0–100.0)
Monocytes Absolute: 0.3 10*3/uL (ref 0.1–1.0)
Monocytes Relative: 6.1 % (ref 3.0–12.0)
Neutro Abs: 3.2 10*3/uL (ref 1.4–7.7)
Neutrophils Relative %: 62.1 % (ref 43.0–77.0)
Platelets: 209 10*3/uL (ref 150.0–400.0)
RBC: 5.44 Mil/uL (ref 4.22–5.81)
RDW: 13 % (ref 11.5–14.6)
WBC: 5.2 10*3/uL (ref 4.5–10.5)

## 2011-11-17 LAB — LIPID PANEL
Cholesterol: 235 mg/dL — ABNORMAL HIGH (ref 0–200)
HDL: 48.5 mg/dL
Total CHOL/HDL Ratio: 5
Triglycerides: 92 mg/dL (ref 0.0–149.0)
VLDL: 18.4 mg/dL (ref 0.0–40.0)

## 2011-11-17 LAB — BASIC METABOLIC PANEL WITH GFR
BUN: 12 mg/dL (ref 6–23)
CO2: 25 meq/L (ref 19–32)
Calcium: 8.9 mg/dL (ref 8.4–10.5)
Chloride: 105 meq/L (ref 96–112)
Creatinine, Ser: 1.2 mg/dL (ref 0.4–1.5)
GFR: 71.19 mL/min
Glucose, Bld: 125 mg/dL — ABNORMAL HIGH (ref 70–99)
Potassium: 3.3 meq/L — ABNORMAL LOW (ref 3.5–5.1)
Sodium: 139 meq/L (ref 135–145)

## 2011-11-17 LAB — TSH: TSH: 2.16 u[IU]/mL (ref 0.35–5.50)

## 2011-11-17 LAB — HEPATIC FUNCTION PANEL
ALT: 63 U/L — ABNORMAL HIGH (ref 0–53)
AST: 38 U/L — ABNORMAL HIGH (ref 0–37)
Albumin: 4.1 g/dL (ref 3.5–5.2)
Alkaline Phosphatase: 64 U/L (ref 39–117)
Bilirubin, Direct: 0 mg/dL (ref 0.0–0.3)
Total Bilirubin: 0.9 mg/dL (ref 0.3–1.2)
Total Protein: 7.6 g/dL (ref 6.0–8.3)

## 2011-11-17 LAB — LDL CHOLESTEROL, DIRECT: Direct LDL: 177.7 mg/dL

## 2011-11-17 LAB — POCT URINALYSIS DIPSTICK
Bilirubin, UA: NEGATIVE
Blood, UA: NEGATIVE
Glucose, UA: NEGATIVE
Nitrite, UA: NEGATIVE
Spec Grav, UA: 1.02
Urobilinogen, UA: 0.2

## 2011-11-17 LAB — URIC ACID: Uric Acid, Serum: 7.8 mg/dL (ref 4.0–7.8)

## 2011-11-17 LAB — HEMOGLOBIN A1C: Hgb A1c MFr Bld: 7.1 % — ABNORMAL HIGH (ref 4.6–6.5)

## 2011-11-17 MED ORDER — TADALAFIL 20 MG PO TABS: 20.0000 mg | ORAL_TABLET | Freq: Every day | ORAL | Status: DC | PRN

## 2011-11-17 MED ORDER — VALSARTAN-HYDROCHLOROTHIAZIDE 320-25 MG PO TABS: 1.0000 | ORAL_TABLET | Freq: Every day | ORAL | Status: DC

## 2011-11-17 MED ORDER — INDOMETHACIN 50 MG PO CAPS: 50.0000 mg | ORAL_CAPSULE | Freq: Three times a day (TID) | ORAL | Status: AC

## 2011-11-17 MED ORDER — ROSUVASTATIN CALCIUM 20 MG PO TABS: 20.0000 mg | ORAL_TABLET | Freq: Every day | ORAL | Status: DC

## 2011-11-17 NOTE — Progress Notes (Signed)
  Subjective:    Patient ID: Zachary [name], male    DOB: [birth year], 46 y.o.   MRN: [id number]  HPI Here for 4 days of swelling and pain in the right wrist. No trauma. He thinks it may be gout. Today it feels better. He is fasting for labs. He also asks for help with erection problems.    Review of Systems  Constitutional: Negative.   Respiratory: Negative.   Cardiovascular: Negative.   Musculoskeletal: Positive for arthralgias.       Objective:   Physical Exam  Constitutional: He appears well-developed and well-nourished.  Cardiovascular: Normal rate, regular rhythm, normal heart sounds and intact distal pulses.   Pulmonary/Chest: Effort normal and breath sounds normal.  Musculoskeletal:       Right wrist is tender throughout the entire wrist, no erythema or edema           Assessment & Plan:  Use Indocin for gout. Check a uric acid level. He had tried Allopurinol in the past but it caused too much GI problems. Perhaps he could try Uloric. Check his A1c, etc. Try Cialis.

## 2011-11-22 ENCOUNTER — Telehealth: Payer: Self-pay | Admitting: Family Medicine

## 2011-11-22 ENCOUNTER — Encounter: Payer: Self-pay | Admitting: Family Medicine

## 2011-11-22 MED ORDER — POTASSIUM CHLORIDE ER 10 MEQ PO TBCR: 10.0000 meq | EXTENDED_RELEASE_TABLET | Freq: Every day | ORAL | Status: DC

## 2011-11-22 MED ORDER — EZETIMIBE 10 MG PO TABS: 10.0000 mg | ORAL_TABLET | Freq: Every day | ORAL | Status: DC

## 2011-11-22 NOTE — Progress Notes (Signed)
Quick Note:  I left voice message for pt to return my call. ______ 

## 2011-11-22 NOTE — Progress Notes (Signed)
Quick Note:  I put a copy of results in mail and also sent both scripts e-scribe. ______

## 2011-11-22 NOTE — Addendum Note (Signed)
Addended by: Aniceto Boss A on: 11/22/2011 08:45 AM   Modules accepted: Orders

## 2011-11-22 NOTE — Telephone Encounter (Signed)
Pt is going to take Crestor and Dr. Clent Ridges did say to cancel the Zetia which I did.

## 2011-12-15 ENCOUNTER — Ambulatory Visit (INDEPENDENT_AMBULATORY_CARE_PROVIDER_SITE_OTHER): Payer: PRIVATE HEALTH INSURANCE | Admitting: Family Medicine

## 2011-12-15 ENCOUNTER — Encounter: Payer: Self-pay | Admitting: Family Medicine

## 2011-12-15 VITALS — BP 130/94 | HR 92 | Temp 99.0°F | Wt 319.0 lb

## 2011-12-15 DIAGNOSIS — J40 Bronchitis, not specified as acute or chronic: Secondary | ICD-10-CM

## 2011-12-15 DIAGNOSIS — J45901 Unspecified asthma with (acute) exacerbation: Secondary | ICD-10-CM

## 2011-12-15 MED ORDER — METHYLPREDNISOLONE ACETATE 80 MG/ML IJ SUSP
120.0000 mg | Freq: Once | INTRAMUSCULAR | Status: AC
  Administered 2011-12-15: 120 mg via INTRAMUSCULAR

## 2011-12-15 MED ORDER — CLARITHROMYCIN 500 MG PO TABS: 500.0000 mg | ORAL_TABLET | Freq: Two times a day (BID) | ORAL | Status: AC

## 2011-12-15 MED ORDER — METFORMIN HCL 500 MG PO TABS: 500.0000 mg | ORAL_TABLET | Freq: Two times a day (BID) | ORAL | Status: DC

## 2011-12-15 MED ORDER — HYDROCOD POLST-CHLORPHEN POLST 10-8 MG/5ML PO LQCR: 5.0000 mL | Freq: Two times a day (BID) | ORAL | Status: DC

## 2011-12-15 NOTE — Progress Notes (Signed)
  Subjective:    Patient ID: Zachary [name], male    DOB: 17-Mar-1966, 46 y.o.   MRN: [id number]  HPI Here for 3 days of sinus pressure, PND, ST, dry cough, and wheezing. Fever to 101 degrees. No NVD. Using his nebulizer several times a day.    Review of Systems  Constitutional: Negative.   HENT: Positive for congestion and postnasal drip.   Eyes: Negative.   Respiratory: Positive for cough, chest tightness, shortness of breath and wheezing.   Cardiovascular: Negative.        Objective:   Physical Exam  Constitutional: He appears well-developed and well-nourished.  HENT:  Right Ear: External ear normal.  Left Ear: External ear normal.  Nose: Nose normal.  Mouth/Throat: Oropharynx is clear and moist.  Eyes: Conjunctivae normal are normal.  Neck: No thyromegaly present.  Pulmonary/Chest: Effort normal. No respiratory distress. He has no rales.       Scattered rhonchi and wheezes   Lymphadenopathy:    He has no cervical adenopathy.          Assessment & Plan:  Add Mucinex.

## 2012-02-12 ENCOUNTER — Encounter: Payer: Self-pay | Admitting: Family Medicine

## 2012-02-12 ENCOUNTER — Ambulatory Visit (INDEPENDENT_AMBULATORY_CARE_PROVIDER_SITE_OTHER): Payer: PRIVATE HEALTH INSURANCE | Admitting: Family Medicine

## 2012-02-12 VITALS — BP 126/84 | HR 92 | Temp 98.7°F | Wt 317.0 lb

## 2012-02-12 DIAGNOSIS — M542 Cervicalgia: Secondary | ICD-10-CM

## 2012-02-12 DIAGNOSIS — E119 Type 2 diabetes mellitus without complications: Secondary | ICD-10-CM

## 2012-02-12 LAB — LIPID PANEL
Cholesterol: 171 mg/dL (ref 0–200)
HDL: 43.4 mg/dL (ref >39.00)
Total CHOL/HDL Ratio: 4
Triglycerides: 96 mg/dL (ref 0.0–149.0)

## 2012-02-12 LAB — HEMOGLOBIN A1C: Hgb A1c MFr Bld: 6.5 % (ref 4.6–6.5)

## 2012-02-12 LAB — HEPATIC FUNCTION PANEL
ALT: 55 U/L — ABNORMAL HIGH (ref 0–53)
AST: 37 U/L (ref 0–37)
Albumin: 4.6 g/dL (ref 3.5–5.2)
Alkaline Phosphatase: 55 U/L (ref 39–117)
Total Protein: 8 g/dL (ref 6.0–8.3)

## 2012-02-12 MED ORDER — DICLOFENAC SODIUM 75 MG PO TBEC: 75.0000 mg | DELAYED_RELEASE_TABLET | Freq: Two times a day (BID) | ORAL | Status: DC

## 2012-02-12 MED ORDER — CYCLOBENZAPRINE HCL 10 MG PO TABS: 10.0000 mg | ORAL_TABLET | Freq: Three times a day (TID) | ORAL | Status: DC | PRN

## 2012-02-12 NOTE — Progress Notes (Signed)
  Subjective:    Patient ID: Zachary [name], male    DOB: Jul 30, 1965, 46 y.o.   MRN: [id number]  HPI Here for 2 days of stiffness and a pain in the left neck and upper back. No recent trauma. Using Motrin.    Review of Systems  Constitutional: Negative.   HENT: Positive for neck pain and neck stiffness.        Objective:   Physical Exam  Constitutional: He appears well-developed and well-nourished.  Musculoskeletal:       Tender in the left neck and upper trapezius, some spasm woith reduced ROM          Assessment & Plan:  Heat, Diclofenac and Flexeril, try massage

## 2012-02-14 ENCOUNTER — Encounter: Payer: Self-pay | Admitting: General Practice

## 2012-03-13 ENCOUNTER — Ambulatory Visit (INDEPENDENT_AMBULATORY_CARE_PROVIDER_SITE_OTHER): Payer: PRIVATE HEALTH INSURANCE | Admitting: Family Medicine

## 2012-03-13 ENCOUNTER — Encounter: Payer: Self-pay | Admitting: Family Medicine

## 2012-03-13 VITALS — BP 122/84 | HR 100 | Temp 98.7°F | Wt 322.0 lb

## 2012-03-13 DIAGNOSIS — J40 Bronchitis, not specified as acute or chronic: Secondary | ICD-10-CM

## 2012-03-13 DIAGNOSIS — E119 Type 2 diabetes mellitus without complications: Secondary | ICD-10-CM

## 2012-03-13 LAB — BASIC METABOLIC PANEL
BUN: 16 mg/dL (ref 6–23)
Calcium: 9.2 mg/dL (ref 8.4–10.5)
Chloride: 104 mEq/L (ref 96–112)
Creatinine, Ser: 1.3 mg/dL (ref 0.4–1.5)

## 2012-03-13 MED ORDER — CLARITHROMYCIN 500 MG PO TABS: 500.0000 mg | ORAL_TABLET | Freq: Two times a day (BID) | ORAL | Status: DC

## 2012-03-13 MED ORDER — HYDROCOD POLST-CHLORPHEN POLST 10-8 MG/5ML PO LQCR: 5.0000 mL | Freq: Two times a day (BID) | ORAL | Status: DC

## 2012-03-13 NOTE — Progress Notes (Signed)
  Subjective:    Patient ID: Zachary [name], male    DOB: 05/27/1965, 46 y.o.   MRN: [id number]  HPI Here for 3 days of ST, HA, chest congestion, coughing up yellow sputum, and some diarrhea. No fever or vomiting.    Review of Systems  Constitutional: Negative.   HENT: Positive for congestion, postnasal drip and sinus pressure.   Eyes: Negative.   Respiratory: Positive for cough and chest tightness. Negative for shortness of breath and wheezing.        Objective:   Physical Exam  Constitutional: He appears well-developed and well-nourished.  HENT:  Right Ear: External ear normal.  Left Ear: External ear normal.  Nose: Nose normal.  Mouth/Throat: Oropharynx is clear and moist. No oropharyngeal exudate.  Eyes: Conjunctivae normal are normal.  Pulmonary/Chest: Effort normal and breath sounds normal.  Lymphadenopathy:    He has no cervical adenopathy.          Assessment & Plan:  Add Mucinex. Get a BMET today since he was started on potassium a few months ago.

## 2012-06-06 ENCOUNTER — Other Ambulatory Visit: Payer: Self-pay | Admitting: Family Medicine

## 2012-06-10 ENCOUNTER — Encounter: Payer: Self-pay | Admitting: Family Medicine

## 2012-06-10 ENCOUNTER — Telehealth: Payer: Self-pay | Admitting: Family Medicine

## 2012-06-10 ENCOUNTER — Ambulatory Visit (INDEPENDENT_AMBULATORY_CARE_PROVIDER_SITE_OTHER): Payer: PRIVATE HEALTH INSURANCE | Admitting: Family Medicine

## 2012-06-10 VITALS — BP 160/98 | HR 78 | Temp 97.6°F | Wt 312.0 lb

## 2012-06-10 DIAGNOSIS — M109 Gout, unspecified: Secondary | ICD-10-CM

## 2012-06-10 DIAGNOSIS — L03011 Cellulitis of right finger: Secondary | ICD-10-CM

## 2012-06-10 DIAGNOSIS — L03019 Cellulitis of unspecified finger: Secondary | ICD-10-CM

## 2012-06-10 MED ORDER — ROSUVASTATIN CALCIUM 40 MG PO TABS: 40.0000 mg | ORAL_TABLET | Freq: Every day | ORAL | Status: DC

## 2012-06-10 MED ORDER — CEPHALEXIN 500 MG PO CAPS: 500.0000 mg | ORAL_CAPSULE | Freq: Three times a day (TID) | ORAL | Status: DC

## 2012-06-10 NOTE — Progress Notes (Signed)
  Subjective:    Patient ID: Zachary [name], male    DOB: 26-Feb-1966, 47 y.o.   MRN: [id number]  HPI Here for 2 things. First he developed sudden pain and swelling in the right foot this past weekend while vacationing in [place]. He thinks it may be gout. It has responded somewhat to indomethacin. No recent trauma. Also he has had swelling and pain around the tip of the right third finger.    Review of Systems  Constitutional: Negative.   Musculoskeletal: Positive for joint swelling and arthralgias.  Skin: Positive for color change.       Objective:   Physical Exam  Constitutional: He appears well-developed and well-nourished.  Musculoskeletal:  The right dorsal foot and lateral ankle are red, warm, swollen, and tender.   Skin:  The tip of the right third finger is swollen and red and tender along the nail edge          Assessment & Plan:  He has a gout flare in the right foot and a paronychia in the finger. Treat the finger with hot soaks and Keflex, treat the foot with Indomethacin.

## 2012-06-10 NOTE — Telephone Encounter (Signed)
Triage Call Report Triage Record Num: [id number] Operator: Tana Felts Patient Name: Zachary [name] Call Date & Time: 06/08/2012 10:46:36AM Patient Phone: [phone] PCP: Tera Mater. Clent Ridges Patient Gender: Male PCP Fax : [phone] Patient DOB: April 16, 1965 [facility] Name: [facility] Reason for Call: Caller: [name]/Patient; PCP: Gershon Crane (Family Practice); CB#: [phone]; Onset: 06/07/12 Call regarding he has a gout flare up in right ankle. He is currently in [place], [place] and does not have his Indocin. [place] [facility] [phone] is near him. [name] has current Rx. Called [facility] to verifiy Indocin Rx last filled 11/29/2011 instructions - Take 50mg  TID with meals, spoke with Vicky,RPH. Called in refill per standing orders - Indocin 50mg  TID with meals #9 no refills. Pt is aware he will need to transfer Rx to nearest pharmacy. Protocol(s) Used: Medication Questions - Adult Recommended Outcome per Protocol: Speak with Provider or Pharmacist within 24 hours Reason for Outcome: Requests refill of prescribed medication with valid refills; lack of medications does not put patient at clinical risk Care Advice: ~ 03/

## 2012-06-12 ENCOUNTER — Ambulatory Visit (INDEPENDENT_AMBULATORY_CARE_PROVIDER_SITE_OTHER): Payer: PRIVATE HEALTH INSURANCE | Admitting: Family Medicine

## 2012-06-12 ENCOUNTER — Telehealth: Payer: Self-pay | Admitting: Family Medicine

## 2012-06-12 ENCOUNTER — Encounter: Payer: Self-pay | Admitting: Family Medicine

## 2012-06-12 DIAGNOSIS — L02519 Cutaneous abscess of unspecified hand: Secondary | ICD-10-CM

## 2012-06-12 MED ORDER — DOXYCYCLINE HYCLATE 100 MG PO CAPS: 100.0000 mg | ORAL_CAPSULE | Freq: Two times a day (BID) | ORAL | Status: AC

## 2012-06-12 NOTE — Telephone Encounter (Signed)
Patient Information:  Caller Name: Doy  Phone: [phone]  Patient: Zachary [name], Zachary [name]  Gender: Male  DOB: Jul 24, 1965  Age: 47 Years  PCP: Gershon Crane [facility])  Office Follow Up:  Does the office need to follow up with this patient?: No  Instructions For The Office: N/A   Symptoms  Reason For Call & Symptoms: Worsening infection around the cuticle and nail bed on the right hand.  He is on 3rd day of Keflex po given by Dr. Clent Ridges.  Was told to call if his sx worsened.  He marked the area and the redness has increased with increased areas of infection.     He has been soaking the finger in [facility] as directed.  Reviewed Health History In EMR: N/A  Reviewed Medications In EMR: N/A  Reviewed Allergies In EMR: N/A  Reviewed Surgeries / Procedures: N/A  Date of Onset of Symptoms: 06/10/2012  Guideline(s) Used:  Hand and Wrist Injury  Wound Infection  Disposition Per Guideline:   See Today in Office  Reason For Disposition Reached:   Taking antibiotic > 72 hours (3 days) and infected wound not improved (pain, pus, redness)  Advice Given:  N/A  Patient Will Follow Care Advice:  YES  Appointment Scheduled:  06/12/2012 11:00:00 Appointment Scheduled Provider:  Gershon Crane [facility])

## 2012-06-12 NOTE — Progress Notes (Signed)
  Subjective:    Patient ID: Zachary [name], male    DOB: [birth year], 47 y.o.   MRN: [id number]  HPI Here for worsening infection on the right third finger. He has been on Keflex for 3 days and he has used hot soaks, however the swelling is worse and it is painful.    Review of Systems  Constitutional: Negative.   Skin: Positive for wound.       Objective:   Physical Exam  Constitutional: He appears well-developed and well-nourished.  Skin:  The tip of the right 3rd finger is red, warm, swollen and tender.           Assessment & Plan:  We lanced the finger with a scalpel and expressed some purulent matter. This was sent for a culture. Switch from Keflex to Doxycycline to cover for possible MRSA.

## 2012-06-15 LAB — WOUND CULTURE

## 2012-06-18 NOTE — Progress Notes (Signed)
Quick Note:  I left voice message with results. ______ 

## 2012-10-16 ENCOUNTER — Encounter: Payer: Self-pay | Admitting: Family Medicine

## 2012-10-16 ENCOUNTER — Ambulatory Visit (INDEPENDENT_AMBULATORY_CARE_PROVIDER_SITE_OTHER): Payer: PRIVATE HEALTH INSURANCE | Admitting: Family Medicine

## 2012-10-16 VITALS — BP 154/100 | HR 82 | Temp 98.4°F | Wt 318.0 lb

## 2012-10-16 DIAGNOSIS — E785 Hyperlipidemia, unspecified: Secondary | ICD-10-CM

## 2012-10-16 DIAGNOSIS — I10 Essential (primary) hypertension: Secondary | ICD-10-CM

## 2012-10-16 DIAGNOSIS — E119 Type 2 diabetes mellitus without complications: Secondary | ICD-10-CM

## 2012-10-16 LAB — BASIC METABOLIC PANEL
CO2: 25 mEq/L (ref 19–32)
Calcium: 9 mg/dL (ref 8.4–10.5)
Creatinine, Ser: 1.3 mg/dL (ref 0.4–1.5)
GFR: 66.33 mL/min (ref >60.00)
Glucose, Bld: 97 mg/dL (ref 70–99)
Sodium: 140 mEq/L (ref 135–145)

## 2012-10-16 LAB — LIPID PANEL
HDL: 40.4 mg/dL (ref >39.00)
Total CHOL/HDL Ratio: 3
Triglycerides: 65 mg/dL (ref 0.0–149.0)
VLDL: 13 mg/dL (ref 0.0–40.0)

## 2012-10-16 LAB — HEPATIC FUNCTION PANEL
Albumin: 4.3 g/dL (ref 3.5–5.2)
Bilirubin, Direct: 0.2 mg/dL (ref 0.0–0.3)
Total Protein: 7.6 g/dL (ref 6.0–8.3)

## 2012-10-16 LAB — CBC WITH DIFFERENTIAL/PLATELET
Basophils Relative: 0.5 % (ref 0.0–3.0)
Eosinophils Relative: 2 % (ref 0.0–5.0)
Hemoglobin: 16.5 g/dL (ref 13.0–17.0)
Lymphocytes Relative: 31.7 % (ref 12.0–46.0)
Neutro Abs: 3.6 10*3/uL (ref 1.4–7.7)
Neutrophils Relative %: 59.3 % (ref 43.0–77.0)
RBC: 5.24 Mil/uL (ref 4.22–5.81)
WBC: 6 10*3/uL (ref 4.5–10.5)

## 2012-10-16 MED ORDER — CARVEDILOL 6.25 MG PO TABS: 6.2500 mg | ORAL_TABLET | Freq: Two times a day (BID) | ORAL | Status: DC

## 2012-10-16 NOTE — Progress Notes (Signed)
  Subjective:    Patient ID: Zachary [name], male    DOB: May 30, 1965, 47 y.o.   MRN: [id number]  HPI Here to follow up a hospital stay at [facility] from 09-27-12 to 09-28-12 for chest pains. His BP was extremely high at first, one reading was 220/128. This was brought down with NTG. His cardiac enzymes ruled out for MI. After he was sent home he had a stress test on 09-30-12 which was normal. His EF is 67%. He has had no further chest pains, but his BP remains elevated. His A1c was 8.2. He has made dramatic changes in his diet and he plans to exercise with a personal trainer.    Review of Systems  Constitutional: Negative.   Respiratory: Negative.   Cardiovascular: Negative.        Objective:   Physical Exam  Constitutional: He appears well-developed and well-nourished.  Cardiovascular: Normal rate, regular rhythm, normal heart sounds and intact distal pulses.   Pulmonary/Chest: Effort normal and breath sounds normal.          Assessment & Plan:  We will increase the Coreg to 6.25 mg bid. Strongly advised him to lose weight. Get labs today. Recheck in one month.

## 2012-10-18 ENCOUNTER — Ambulatory Visit: Payer: PRIVATE HEALTH INSURANCE | Admitting: Family Medicine

## 2012-10-18 NOTE — Progress Notes (Signed)
Quick Note:  I left voice message with results. ______ 

## 2012-10-24 ENCOUNTER — Encounter: Payer: Self-pay | Admitting: Family Medicine

## 2012-11-01 ENCOUNTER — Encounter: Payer: Self-pay | Admitting: Family Medicine

## 2012-11-04 ENCOUNTER — Encounter: Payer: Self-pay | Admitting: Family Medicine

## 2012-11-15 ENCOUNTER — Encounter: Payer: Self-pay | Admitting: Family Medicine

## 2012-11-15 ENCOUNTER — Ambulatory Visit (INDEPENDENT_AMBULATORY_CARE_PROVIDER_SITE_OTHER): Payer: PRIVATE HEALTH INSURANCE | Admitting: Family Medicine

## 2012-11-15 VITALS — BP 136/108 | HR 76 | Temp 98.4°F | Wt 317.0 lb

## 2012-11-15 DIAGNOSIS — E119 Type 2 diabetes mellitus without complications: Secondary | ICD-10-CM

## 2012-11-15 DIAGNOSIS — I10 Essential (primary) hypertension: Secondary | ICD-10-CM

## 2012-11-15 DIAGNOSIS — E785 Hyperlipidemia, unspecified: Secondary | ICD-10-CM

## 2012-11-15 DIAGNOSIS — Z23 Encounter for immunization: Secondary | ICD-10-CM

## 2012-11-15 MED ORDER — CARVEDILOL 12.5 MG PO TABS: 12.5000 mg | ORAL_TABLET | Freq: Two times a day (BID) | ORAL | Status: DC

## 2012-11-15 NOTE — Progress Notes (Signed)
  Subjective:    Patient ID: Zachary [name], male    DOB: 10-Mar-1966, 48 y.o.   MRN: [id number]  HPI Here to follow up. For the past month he has tried to watch his diet better and he has avoided all sodas. His weight is down one pound. His BP is down slightly but his diastolic remains high. He feels fine and has had no more chest pains.    Review of Systems  Constitutional: Negative.   Respiratory: Negative.   Cardiovascular: Negative.        Objective:   Physical Exam  Constitutional: He appears well-developed and well-nourished.  Cardiovascular: Normal rate, regular rhythm, normal heart sounds and intact distal pulses.   Pulmonary/Chest: Effort normal and breath sounds normal.          Assessment & Plan:  Increase Coreg to 12.5 mg bid. Check an A1c today.

## 2012-11-15 NOTE — Addendum Note (Signed)
Addended by: Alfred Levins D on: 11/15/2012 09:24 AM   Modules accepted: Orders

## 2012-11-28 ENCOUNTER — Encounter: Payer: Self-pay | Admitting: Family Medicine

## 2012-11-28 ENCOUNTER — Ambulatory Visit (INDEPENDENT_AMBULATORY_CARE_PROVIDER_SITE_OTHER): Payer: PRIVATE HEALTH INSURANCE | Admitting: Family Medicine

## 2012-11-28 VITALS — BP 130/82 | HR 87 | Temp 98.7°F | Wt 317.0 lb

## 2012-11-28 DIAGNOSIS — J069 Acute upper respiratory infection, unspecified: Secondary | ICD-10-CM

## 2012-11-28 MED ORDER — HYDROCOD POLST-CHLORPHEN POLST 10-8 MG/5ML PO LQCR: 5.0000 mL | Freq: Two times a day (BID) | ORAL | Status: DC | PRN

## 2012-11-28 NOTE — Progress Notes (Signed)
  Subjective:    Patient ID: Zachary [name], male    DOB: 06/23/1965, 47 y.o.   MRN: [id number]  HPI Here for 4 days of a dry hacking cough. No wheezing or fever. Drinking fluids.    Review of Systems  Constitutional: Negative.   HENT: Negative.   Eyes: Negative.   Respiratory: Positive for cough. Negative for shortness of breath and wheezing.   Cardiovascular: Negative.        Objective:   Physical Exam  Constitutional: He appears well-developed and well-nourished. No distress.  HENT:  Right Ear: External ear normal.  Left Ear: External ear normal.  Nose: Nose normal.  Mouth/Throat: Oropharynx is clear and moist.  Eyes: Conjunctivae are normal.  Pulmonary/Chest: Effort normal and breath sounds normal.  Lymphadenopathy:    He has no cervical adenopathy.          Assessment & Plan:  Recheck prn. Add Mucinex

## 2013-01-30 ENCOUNTER — Other Ambulatory Visit: Payer: Self-pay

## 2013-04-01 ENCOUNTER — Encounter: Payer: Self-pay | Admitting: Family Medicine

## 2013-04-01 ENCOUNTER — Ambulatory Visit (INDEPENDENT_AMBULATORY_CARE_PROVIDER_SITE_OTHER): Payer: PRIVATE HEALTH INSURANCE | Admitting: Family Medicine

## 2013-04-01 VITALS — BP 130/90 | HR 102 | Temp 97.8°F | Wt 324.0 lb

## 2013-04-01 DIAGNOSIS — B9789 Other viral agents as the cause of diseases classified elsewhere: Principal | ICD-10-CM

## 2013-04-01 DIAGNOSIS — J069 Acute upper respiratory infection, unspecified: Secondary | ICD-10-CM

## 2013-04-01 MED ORDER — HYDROCOD POLST-CHLORPHEN POLST 10-8 MG/5ML PO LQCR: 5.0000 mL | Freq: Two times a day (BID) | ORAL | Status: DC | PRN

## 2013-04-01 NOTE — Patient Instructions (Signed)
Acute Bronchitis Bronchitis is inflammation of the airways that extend from the windpipe into the lungs (bronchi). The inflammation often causes mucus to develop. This leads to a cough, which is the most common symptom of bronchitis.  In acute bronchitis, the condition usually develops suddenly and goes away over time, usually in a couple weeks. Smoking, allergies, and asthma can make bronchitis worse. Repeated episodes of bronchitis may cause further lung problems.  CAUSES Acute bronchitis is most often caused by the same virus that causes a cold. The virus can spread from person to person (contagious).  SIGNS AND SYMPTOMS   Cough.   Fever.   Coughing up mucus.   Body aches.   Chest congestion.   Chills.   Shortness of breath.   Sore throat.  DIAGNOSIS  Acute bronchitis is usually diagnosed through a physical exam. Tests, such as chest X-rays, are sometimes done to rule out other conditions.  TREATMENT  Acute bronchitis usually goes away in a couple weeks. Often times, no medical treatment is necessary. Medicines are sometimes given for relief of fever or cough. Antibiotics are usually not needed but may be prescribed in certain situations. In some cases, an inhaler may be recommended to help reduce shortness of breath and control the cough. A cool mist vaporizer may also be used to help thin bronchial secretions and make it easier to clear the chest.  HOME CARE INSTRUCTIONS  Get plenty of rest.   Drink enough fluids to keep your urine clear or pale yellow (unless you have a medical condition that requires fluid restriction). Increasing fluids may help thin your secretions and will prevent dehydration.   Only take over-the-counter or prescription medicines as directed by your health care provider.   Avoid smoking and secondhand smoke. Exposure to cigarette smoke or irritating chemicals will make bronchitis worse. If you are a smoker, consider using nicotine gum or skin  patches to help control withdrawal symptoms. Quitting smoking will help your lungs heal faster.   Reduce the chances of another bout of acute bronchitis by washing your hands frequently, avoiding people with cold symptoms, and trying not to touch your hands to your mouth, nose, or eyes.   Follow up with your health care provider as directed.  SEEK MEDICAL CARE IF: Your symptoms do not improve after 1 week of treatment.  SEEK IMMEDIATE MEDICAL CARE IF:  You develop an increased fever or chills.   You have chest pain.   You have severe shortness of breath.  You have bloody sputum.   You develop dehydration.  You develop fainting.  You develop repeated vomiting.  You develop a severe headache. MAKE SURE YOU:   Understand these instructions.  Will watch your condition.  Will get help right away if you are not doing well or get worse. Document Released: 04/20/2004 Document Revised: 11/13/2012 Document Reviewed: 09/03/2012 Exit[employer] Patient Information 2014 [employer].  

## 2013-04-01 NOTE — Progress Notes (Signed)
Pre visit review using our clinic review tool, if applicable. No additional management support is needed unless otherwise documented below in the visit note. 

## 2013-04-01 NOTE — Progress Notes (Signed)
   Subjective:    Patient ID: Zachary RotundaJames T [name], male    DOB: 08-29-1965, 48 y.o.   MRN: [id number]  HPI Acute visit Patient seen with one-day history of sore throat which has been relatively mild and mostly nonproductive cough and nasal congestion. Denies any fevers or chills. No body aches at this point. He works in [employer], [employer] and dust is exposed to people are sick daily. He's had previous flu vaccination. Denies a nausea or vomiting. Denies any skin rash. Mild intermittent headache.  Past Medical History  Diagnosis Date  . Asthma   . Hyperlipidemia   . Hypertension   . Diabetes mellitus   . Gout    No past surgical history on file.  reports that he has never smoked. He has never used smokeless tobacco. He reports that he drinks alcohol. He reports that he does not use illicit drugs. family history includes Breast cancer in his mother. Allergies  Allergen Reactions  . Ace Inhibitors     REACTION: cough  . Sulfamethoxazole-Trimethoprim     REACTION: unspecified      Review of Systems  Constitutional: Positive for fatigue. Negative for fever and chills.  HENT: Positive for congestion and sore throat.   Respiratory: Positive for cough.   Neurological: Positive for headaches.       Objective:   Physical Exam  Constitutional: He appears well-developed and well-nourished.  HENT:  Right Ear: External ear normal.  Left Ear: External ear normal.  Mouth/Throat: Oropharynx is clear and moist.  Neck: Neck supple.  Cardiovascular: Normal rate.   Pulmonary/Chest: Effort normal and breath sounds normal. No respiratory distress. He has no wheezes. He has no rales.  Lymphadenopathy:    He has no cervical adenopathy.          Assessment & Plan:  Acute viral syndrome. Rapid strep negative. Treat symptomatically. Refill Tussionex 1 teaspoon each bedtime for severe cough.

## 2013-04-11 ENCOUNTER — Emergency Department
Admission: EM | Admit: 2013-04-11 | Discharge: 2013-04-11 | Disposition: A | Discharge status: Home / Self Care | Payer: PRIVATE HEALTH INSURANCE | Source: Home / Self Care

## 2013-04-11 ENCOUNTER — Encounter: Payer: Self-pay | Admitting: Emergency Medicine

## 2013-04-11 DIAGNOSIS — H6590 Unspecified nonsuppurative otitis media, unspecified ear: Secondary | ICD-10-CM

## 2013-04-11 DIAGNOSIS — J309 Allergic rhinitis, unspecified: Secondary | ICD-10-CM

## 2013-04-11 DIAGNOSIS — H6593 Unspecified nonsuppurative otitis media, bilateral: Secondary | ICD-10-CM

## 2013-04-11 MED ORDER — PREDNISONE (PAK) 10 MG PO TABS: ORAL_TABLET | ORAL | Status: DC

## 2013-04-11 NOTE — ED Provider Notes (Signed)
CSN: [id number]     Arrival date & time 04/11/13  0913 History   None    Chief Complaint  Patient presents with  . Otalgia    HPI URI HISTORY  Zachary [name] is a 48 y.o. male who complains of onset of bilateral ear pain and pressure and cold symptoms for several days.  Have been using over-the-counter treatment and Nettie pot which helps a little bit.  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  He states his hypertension is usually controlled, but he just took his BP med 20 minutes before coming into our office today. Denies headache visual change, chest pain, shortness of breath or any focal neurologic symptoms.  No chills/sweats No Fever  +  Nasal congestion No  Discolored Post-nasal drainage Mild sinus pain/pressure No sore throat  No  cough No wheezing No chest congestion No hemoptysis No shortness of breath No pleuritic pain  No itchy/red eyes No earache  No nausea No vomiting No abdominal pain No diarrhea  No skin rashes +  Fatigue No myalgias No headache  Past Medical History  Diagnosis Date  . Asthma   . Hyperlipidemia   . Hypertension   . Diabetes mellitus   . Gout    History reviewed. No pertinent past surgical history. Family History  Problem Relation Age of Onset  . Breast cancer Mother    History  Substance Use Topics  . Smoking status: Never Smoker   . Smokeless tobacco: Never Used  . Alcohol Use: 0.0 oz/week     Comment: once a month    Review of Systems  All other systems reviewed and are negative.    Allergies  Ace inhibitors and Sulfamethoxazole-trimethoprim  Home Medications   Current Outpatient Rx  Name  Route  Sig  Dispense  Refill  . albuterol (PROAIR HFA) 108 (90 BASE) MCG/ACT inhaler   Inhalation   Inhale 2 puffs into the lungs every 6 (six) hours as needed for wheezing.   1 Inhaler   2     Dispense   With spacer   .  albuterol (PROVENTIL) (2.5 MG/3ML) 0.083% nebulizer solution   Nebulization   Take 3 mLs (2.5 mg total) by nebulization every 6 (six) hours as needed.   75 mL   11   . aspirin 81 MG tablet   Oral   Take 81 mg by mouth daily.          . carvedilol (COREG) 12.5 MG tablet   Oral   Take 1 tablet (12.5 mg total) by mouth 2 (two) times daily with a meal.   60 tablet   11   . chlorpheniramine-HYDROcodone (TUSSIONEX PENNKINETIC ER) 10-8 MG/5ML LQCR   Oral   Take 5 mLs by mouth every 12 (twelve) hours as needed (cough).   120 mL   0   . Fluticasone-Salmeterol (ADVAIR DISKUS) 250-50 MCG/DOSE AEPB   Inhalation   Inhale 1 puff into the lungs 2 (two) times daily.   60 each   11   . metFORMIN (GLUCOPHAGE) 500 MG tablet   Oral   Take 1,000 mg by mouth 2 (two) times daily with a meal.         . omeprazole (PRILOSEC) 40 MG capsule   Oral   Take 1 capsule (40 mg total) by mouth daily.   30 capsule   11   . EXPIRED: potassium chloride (KLOR-CON 10)  10 MEQ tablet   Oral   Take 1 tablet (10 mEq total) by mouth daily.   30 tablet   11   . predniSONE (STERAPRED UNI-PAK) 10 MG tablet      Take as directed for 6 days.--Take 6 on day 1, 5 on day 2, 4 on day 3, then 3 tablets on day 4, then 2 tablets on day 5, then 1 on day 6.   21 tablet   0   . rosuvastatin (CRESTOR) 40 MG tablet   Oral   Take 20 mg by mouth daily.         . tadalafil (CIALIS) 20 MG tablet   Oral   Take 1 tablet (20 mg total) by mouth daily as needed for erectile dysfunction.   10 tablet   5   . valsartan-hydrochlorothiazide (DIOVAN-HCT) 320-25 MG per tablet   Oral   Take 1 tablet by mouth daily.   90 tablet   3    BP 171/125  Pulse 97  Temp(Src) 99.1 F (37.3 C) (Oral)  Resp 18  Ht 6\' 2"  (1.88 m)  Wt 322 lb (146.058 kg)  BMI 41.32 kg/m2  SpO2 97% Physical Exam  Nursing note and vitals reviewed. Constitutional: He is oriented to person, place, and time. He appears well-developed and  well-nourished. No distress.  HENT:  Head: Normocephalic and atraumatic.  Right Ear: External ear and ear canal normal. A middle ear effusion (serous fluid) is present.  Left Ear: External ear and ear canal normal. A middle ear effusion (Serous fluid) is present.  Nose: Mucosal edema and rhinorrhea (serous) present. Right sinus exhibits maxillary sinus tenderness (Minimal). Left sinus exhibits maxillary sinus tenderness (minimal).  Mouth/Throat: Oropharynx is clear and moist. No oral lesions. No oropharyngeal exudate.  Other than serous otitis media bilaterally, TMs are normal  Eyes: Right eye exhibits no discharge. Left eye exhibits no discharge. No scleral icterus.  Neck: Neck supple.  Cardiovascular: Normal rate, regular rhythm and normal heart sounds.   Pulmonary/Chest: Effort normal and breath sounds normal. He has no decreased breath sounds. He has no wheezes. He has no rales.  Lymphadenopathy:    He has no cervical adenopathy.  Neurological: He is alert and oriented to person, place, and time.  Skin: Skin is warm and dry. No rash noted.  Psychiatric: He has a normal mood and affect.    ED Course  Procedures (including critical care time) Labs Review Labs Reviewed - No data to display Imaging Review No results found.  EKG Interpretation    Date/Time:    Ventricular Rate:    PR Interval:    QRS Duration:   QT Interval:    QTC Calculation:   R Axis:     Text Interpretation:             I rechecked BP 150/100 , pulse 88.  He states that BP is normally in the 130/80 range on his BP med  MDM   1. Bilateral serous otitis media   2. Allergic sinusitis    Treatment options discussed, as well as risks, benefits, alternatives. Patient voiced understanding and agreement with the following plans: Flonase, which he already has rx Prednisone 10 mg-6 day Dosepak.--Precautions discussed . Other symptomatic care discussed Avoid decongestants which can raise BP Advised  to monitor BP closely Followup with PCP within 1 week to recheck BP Precautions discussed. Red flags discussed. Questions invited and answered. Patient voiced understanding and agreement.      Onalee Huaavid  Georgina Pillion, MD 04/11/13 1003

## 2013-04-11 NOTE — ED Notes (Signed)
Pt c/o RT ear pain x 2-3 days, with occasional LT ear pain. He reports a hx of eustachian tube dysfunction.

## 2013-04-21 ENCOUNTER — Telehealth: Payer: Self-pay | Admitting: Family Medicine

## 2013-04-21 NOTE — Telephone Encounter (Signed)
[facility] [place] [facility] met[facility]MIN (GLUCOPHAGE) 500 MG tablet #60 last filled 09/29/12

## 2013-04-22 MED ORDER — METFORMIN HCL 1000 MG PO TABS: 1000.0000 mg | ORAL_TABLET | Freq: Two times a day (BID) | ORAL | Status: DC

## 2013-04-22 MED ORDER — VALSARTAN-HYDROCHLOROTHIAZIDE 320-25 MG PO TABS: 1.0000 | ORAL_TABLET | Freq: Every day | ORAL | Status: DC

## 2013-04-22 NOTE — Telephone Encounter (Signed)
I sent 2 scripts e-scribe and left a voice message for pt to confirm how he is taking the Crestor?

## 2013-04-22 NOTE — Telephone Encounter (Signed)
Pt also wants refills for Diovan-HCT & Crestor, 30 day supply with refills.

## 2013-04-23 ENCOUNTER — Telehealth: Payer: Self-pay | Admitting: Family Medicine

## 2013-04-23 MED ORDER — ROSUVASTATIN CALCIUM 40 MG PO TABS: 20.0000 mg | ORAL_TABLET | Freq: Every day | ORAL | Status: DC

## 2013-04-23 NOTE — Telephone Encounter (Signed)
Refill request for Crestor 40 mg take 1/2 tablet every day. I did send script e-scribe.

## 2013-07-07 ENCOUNTER — Emergency Department
Admission: EM | Admit: 2013-07-07 | Discharge: 2013-07-07 | Discharge status: Absent without leave | Payer: PRIVATE HEALTH INSURANCE | Source: Home / Self Care

## 2013-07-07 ENCOUNTER — Telehealth: Payer: Self-pay | Admitting: Family Medicine

## 2013-07-07 NOTE — ED Notes (Signed)
Patient initially screened at the urgent care today with chief complaint heavy bleeding testicle. It has been bleeding since earlier today. No reported trauma. ? Spontaneous rupture.  Has wrapped the affected testicle in coban to stop the bleeding. No pain. 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 Discussed with patient that this is outside the scope of what we do have the urgent care. [place]    Alishba [place], MD 07/07/13 1041

## 2013-09-26 ENCOUNTER — Encounter: Payer: Self-pay | Admitting: Emergency Medicine

## 2013-09-26 ENCOUNTER — Emergency Department
Admission: EM | Admit: 2013-09-26 | Discharge: 2013-09-26 | Disposition: A | Discharge status: Home / Self Care | Payer: PRIVATE HEALTH INSURANCE | Source: Home / Self Care | Attending: Emergency Medicine | Admitting: Emergency Medicine

## 2013-09-26 DIAGNOSIS — M25539 Pain in unspecified wrist: Secondary | ICD-10-CM

## 2013-09-26 DIAGNOSIS — M25532 Pain in left wrist: Secondary | ICD-10-CM

## 2013-09-26 MED ORDER — PREDNISONE (PAK) 10 MG PO TABS: ORAL_TABLET | Freq: Every day | ORAL | Status: DC

## 2013-09-26 NOTE — ED Provider Notes (Signed)
CSN: [id number]     Arrival date & time 09/26/13  [id number] History   First MD Initiated Contact with Patient 09/26/13 [phone]     Chief Complaint  Patient presents with  . 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  So far he has taken naproxen, ibuprofen and indomethacin which has not helped very much.  He does have a history of gout as well as very similar symptoms in the same wrist as well as in his knee.  He is diabetic.  No fever, chills, nausea, vomiting.  It hurts to move his wrist in any direction pain he localizes the pain on the dorsal aspect but mainly throughout the joint.  No recent bug bites or tick bites.  No trauma whatsoever.  Pain he rates as 8 at 10 is worse, constant, worse with moving, less with rest.   Past Medical History  Diagnosis Date  . Asthma   . Hyperlipidemia   . Hypertension   . Diabetes mellitus   . Gout    History reviewed. No pertinent past surgical history. Family History  Problem Relation Age of Onset  . Breast cancer Mother    History  Substance Use Topics  . Smoking status: Never Smoker   . Smokeless tobacco: Never Used  . Alcohol Use: 0.0 oz/week     Comment: once a month    Review of Systems  All other systems reviewed and are negative.   Allergies  Ace inhibitors and Sulfamethoxazole-trimethoprim  Home Medications   Prior to Admission medications   Medication Sig Start Date End Date Taking? Authorizing Provider  aspirin 81 MG tablet Take 81 mg by mouth daily.    Yes Historical Provider, MD  carvedilol (COREG) 12.5 MG tablet Take 1 tablet (12.5 mg total) by mouth 2 (two) times daily with a meal. 11/15/12  Yes Nelwyn SalisburyStephen A Fry, MD  metFORMIN (GLUCOPHAGE) 1000 MG tablet Take 1 tablet (1,000 mg total) by mouth 2 (two) times daily with a meal. 04/22/13  Yes Nelwyn SalisburyStephen A Fry, MD  rosuvastatin  (CRESTOR) 40 MG tablet Take 0.5 tablets (20 mg total) by mouth daily. 04/23/13  Yes Nelwyn SalisburyStephen A Fry, MD  valsartan-hydrochlorothiazide (DIOVAN-HCT) 320-25 MG per tablet Take 1 tablet by mouth daily. 04/22/13  Yes Nelwyn SalisburyStephen A Fry, MD  albuterol [facility](PROAIR HFA) 108 (90 BASE) MCG/ACT inhaler Inhale 2 puffs into the lungs every 6 (six) hours as needed for wheezing. 06/15/10   Madelin HeadingsWanda K Panosh, MD  albuterol (PROVENTIL) (2.5 MG/3ML) 0.083% nebulizer solution Take 3 mLs (2.5 mg total) by nebulization every 6 (six) hours as needed. 02/20/11   Nelwyn SalisburyStephen A Fry, MD  chlorpheniramine-HYDROcodone [facility](TUSSIONEX PENNKINETIC ER) 10-8 MG/5ML LQCR Take 5 mLs by mouth every 12 (twelve) hours as needed (cough). 04/01/13   Kristian CoveyBruce W Burchette, MD  Fluticasone-Salmeterol (ADVAIR DISKUS) 250-50 MCG/DOSE AEPB Inhale 1 puff into the lungs 2 (two) times daily. 02/20/11 11/15/13  Nelwyn SalisburyStephen A Fry, MD  omeprazole (PRILOSEC) 40 MG capsule Take 1 capsule (40 mg total) by mouth daily. 10/12/10 11/15/13  Nelwyn SalisburyStephen A Fry, MD  potassium chloride (KLOR-CON 10) 10 MEQ tablet Take 1 tablet (10 mEq total) by mouth daily. 11/22/11 04/01/13  Nelwyn SalisburyStephen A Fry, MD  predniSONE (STERAPRED UNI-PAK) 10 MG tablet Take as directed for 6 days.--Take 6 on day 1, 5 on day 2, 4 on day 3, then 3 tablets on day 4, then 2  tablets on day 5, then 1 on day 6. 04/11/13   Lajean Manesavid Massey, MD  predniSONE (STERAPRED UNI-PAK) 10 MG tablet Take by mouth daily. 6 day pack, use as directed 09/26/13   Marlaine HindJeffrey H Henderson, MD  tadalafil (CIALIS) 20 MG tablet Take 1 tablet (20 mg total) by mouth daily as needed for erectile dysfunction. 11/17/11 11/15/13  Nelwyn SalisburyStephen A Fry, MD   BP 186/137  Pulse 98  Temp(Src) 98.5 F (36.9 C) (Oral)  Ht 6\' 3"  (1.905 m)  Wt 318 lb (144.244 kg)  BMI 39.75 kg/m2  SpO2 95% Physical Exam  Nursing note and vitals reviewed. Constitutional: He is oriented to person, place, and time. He appears well-developed and well-nourished.  HENT:  Head: Normocephalic and atraumatic.  Eyes:  No scleral icterus.  Neck: Neck supple.  Cardiovascular: Regular rhythm and normal heart sounds.   Pulmonary/Chest: Effort normal and breath sounds normal. No respiratory distress.  Musculoskeletal:  Left wrist examination demonstrates range of motion which is reduced in all planes secondary to pain.  Distal neurovascular status is intact.  There is no swelling.  There is minimal warmth compared to the opposite side.  Global tenderness to palpation throughout the rest.  Neurological: He is alert and oriented to person, place, and time.  Skin: Skin is warm and dry.  Psychiatric: He has a normal mood and affect. His speech is normal.    ED Course  Procedures (including critical care time) Labs Review Labs Reviewed - No data to display  Imaging Review No results found.   MDM   1. Pain in joint, forearm, left    Patient with a very similar history, so this is likely some kind of gout or pseudogout attack.  I prescribed him a prednisone Dosepak and we also opted for a shot of cortisone into the joint.    Verbal consent, risks benefits alternatives were discussed.  Patient agrees.  Area cleansed with Betadine and alcohol and draped cold spray.  Equal amounts of lidocaine and Depo-Medrol were injected dorsally (could not supinate his wrist) into the wrist joint at the point of maximal tenderness.  Patient tolerated the procedure well and he was covered with a Band-Aid.  Wound precautions were given to patient.  Response given to patient.  Followup with sports medicine, orthopedics or primary care physician.  If worsening over the holiday weekend, go to emergency room.  This is because the unlikely differential diagnosis septic joint.  Blood pressure discussed, likely secondary to pain but ER precautions for that and he needs to follow up with his PCP to control that better.  Weight loss encouraged.  Stress reduction encouraged.  Marlaine HindJeffrey H Henderson, MD 09/26/13 [id number]

## 2013-09-26 NOTE — ED Notes (Signed)
Zachary FearingJames complains of left wrist pain and swelling for 4 days. He has taken Indomethacin, Motrin and Aleve with no relief. He does have a history of slightly elevated uric acid levels. Denies fever, chills or sweats.

## 2013-10-01 ENCOUNTER — Ambulatory Visit (INDEPENDENT_AMBULATORY_CARE_PROVIDER_SITE_OTHER): Payer: PRIVATE HEALTH INSURANCE | Admitting: Family Medicine

## 2013-10-01 ENCOUNTER — Encounter: Payer: Self-pay | Admitting: Family Medicine

## 2013-10-01 VITALS — BP 142/96 | Temp 98.7°F | Ht 75.0 in | Wt 322.0 lb

## 2013-10-01 DIAGNOSIS — M65839 Other synovitis and tenosynovitis, unspecified forearm: Secondary | ICD-10-CM

## 2013-10-01 DIAGNOSIS — M778 Other enthesopathies, not elsewhere classified: Secondary | ICD-10-CM

## 2013-10-01 DIAGNOSIS — M65849 Other synovitis and tenosynovitis, unspecified hand: Secondary | ICD-10-CM

## 2013-10-01 NOTE — Progress Notes (Signed)
Pre visit review using our clinic review tool, if applicable. No additional management support is needed unless otherwise documented below in the visit note. 

## 2013-10-01 NOTE — Progress Notes (Signed)
   Subjective:    Patient ID: Rollene RotundaJames T Borunda, male    DOB: [birth year], 48 y.o.   MRN: [id number]  HPI Here for several months of pain in the left forearm. No swelling and no hx of trauma. He has used Ibuprofen and Indocin with no relief. [facility] steroid injection and he was placed on a taper of oral prednisone. This has helped but the pain is coming back as he nears the end of the prednisone taper.    Review of Systems  Constitutional: Negative.   Musculoskeletal: Positive for arthralgias and myalgias. Negative for joint swelling.       Objective:   Physical Exam  Constitutional: He appears well-developed and well-nourished.  Musculoskeletal:  Tender over the dorsal left forearm just proximal to the ulnar wrist area, no swelling, full ROM           Assessment & Plan:  Tendonitis, probably from overuse. We will refer him to PT.

## 2013-10-14 ENCOUNTER — Ambulatory Visit (INDEPENDENT_AMBULATORY_CARE_PROVIDER_SITE_OTHER): Payer: PRIVATE HEALTH INSURANCE | Admitting: Family Medicine

## 2013-10-14 ENCOUNTER — Encounter: Payer: Self-pay | Admitting: Family Medicine

## 2013-10-14 VITALS — BP 146/90 | Temp 98.9°F | Ht 75.0 in | Wt 319.0 lb

## 2013-10-14 DIAGNOSIS — M25539 Pain in unspecified wrist: Secondary | ICD-10-CM

## 2013-10-14 DIAGNOSIS — M25532 Pain in left wrist: Secondary | ICD-10-CM

## 2013-10-14 NOTE — Progress Notes (Signed)
Pre visit review using our clinic review tool, if applicable. No additional management support is needed unless otherwise documented below in the visit note. 

## 2013-10-14 NOTE — Progress Notes (Signed)
   Subjective:    Patient ID: Zachary [name], male    DOB: 23-Apr-1965, 48 y.o.   MRN: [id number]  HPI Here to follow up on left wrist pain which started about 4 weeks ago. He has had a steroid injection and a course of oral steroids. Then we sent him to PT which has improved his ROM. The swelling is down but he still has pain.    Review of Systems  Constitutional: Negative.   Musculoskeletal: Positive for arthralgias.       Objective:   Physical Exam  Constitutional: He appears well-developed and well-nourished.  Musculoskeletal:  The left wrist is quite tender over the distal ulnar head. The swelling is greatly reduced and his ROM is improved           Assessment & Plan:  We will refer him to Hand Surgery.

## 2013-10-20 ENCOUNTER — Other Ambulatory Visit: Payer: Self-pay | Admitting: Orthopedic Surgery

## 2013-10-20 DIAGNOSIS — M25532 Pain in left wrist: Secondary | ICD-10-CM

## 2013-10-29 ENCOUNTER — Other Ambulatory Visit: Payer: PRIVATE HEALTH INSURANCE

## 2013-11-26 ENCOUNTER — Ambulatory Visit (INDEPENDENT_AMBULATORY_CARE_PROVIDER_SITE_OTHER): Payer: PRIVATE HEALTH INSURANCE | Admitting: Family Medicine

## 2013-11-26 ENCOUNTER — Encounter: Payer: Self-pay | Admitting: Family Medicine

## 2013-11-26 VITALS — BP 146/88 | Temp 98.5°F | Ht 75.0 in | Wt 322.0 lb

## 2013-11-26 DIAGNOSIS — B029 Zoster without complications: Secondary | ICD-10-CM

## 2013-11-26 MED ORDER — VALACYCLOVIR HCL 1 G PO TABS: 1000.0000 mg | ORAL_TABLET | Freq: Three times a day (TID) | ORAL | Status: DC

## 2013-11-26 MED ORDER — METHYLPREDNISOLONE 4 MG PO KIT: PACK | ORAL | Status: AC

## 2013-11-26 NOTE — Progress Notes (Signed)
   Subjective:    Patient ID: Zachary [name], male    DOB: 04/19/1965, 48 y.o.   MRN: [id number]  HPI Here for one week of a rash on the right lower back that burns and itches. No other areas of skin are involved.    Review of Systems  Constitutional: Negative.   Skin: Positive for rash.       Objective:   Physical Exam  Constitutional: He appears well-developed and well-nourished.  Skin:  Several patches or red papules and vesicles in the right lower back and flank           Assessment & Plan:  Singles. Treat with Valtrex and a steroid pack. Advised him he is able to work so long as he has a shirt on.

## 2013-11-26 NOTE — Progress Notes (Signed)
Pre visit review using our clinic review tool, if applicable. No additional management support is needed unless otherwise documented below in the visit note. 

## 2014-02-24 ENCOUNTER — Ambulatory Visit (INDEPENDENT_AMBULATORY_CARE_PROVIDER_SITE_OTHER): Payer: PRIVATE HEALTH INSURANCE | Admitting: Family Medicine

## 2014-02-24 ENCOUNTER — Encounter: Payer: Self-pay | Admitting: Family Medicine

## 2014-02-24 VITALS — BP 148/96 | Temp 99.1°F | Ht 75.0 in | Wt 319.0 lb

## 2014-02-24 DIAGNOSIS — I861 Scrotal varices: Secondary | ICD-10-CM | POA: Insufficient documentation

## 2014-02-24 DIAGNOSIS — E119 Type 2 diabetes mellitus without complications: Secondary | ICD-10-CM

## 2014-02-24 MED ORDER — TADALAFIL 20 MG PO TABS: 20.0000 mg | ORAL_TABLET | Freq: Every day | ORAL | Status: DC | PRN

## 2014-02-24 NOTE — Progress Notes (Signed)
Pre visit review using our clinic review tool, if applicable. No additional management support is needed unless otherwise documented below in the visit note. 

## 2014-02-24 NOTE — Progress Notes (Signed)
   Subjective:    Patient ID: Zachary [name], male    DOB: [birth year], [age] y.o.   MRN: [id number]  HPI Here for several things. First he asks for a referral to see an Endocrinologist for his diabetes. 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 Also he has some varicose veins on the scrotum and these have bled at times in the past. He has a small one now that is swollen and he is afraid it will pop open. These are not painful.    Review of Systems  Constitutional: Negative.   Respiratory: Negative.   Cardiovascular: Negative.        Objective:   Physical Exam  Constitutional: He appears well-developed and well-nourished.  Cardiovascular: Normal rate, regular rhythm, normal heart sounds and intact distal pulses.   Pulmonary/Chest: Effort normal and breath sounds normal.  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           Assessment & Plan:  We will refer to Endocrine for the diabetes. The scrotal varicosity was ligated with 4-0 Ethilon and was treated with some Dermabond. This should dry up and fall off in the next week or two. Recheck prn

## 2014-03-05 ENCOUNTER — Ambulatory Visit: Payer: PRIVATE HEALTH INSURANCE | Admitting: Internal Medicine

## 2014-03-10 ENCOUNTER — Ambulatory Visit (INDEPENDENT_AMBULATORY_CARE_PROVIDER_SITE_OTHER): Payer: PRIVATE HEALTH INSURANCE | Admitting: Internal Medicine

## 2014-03-10 ENCOUNTER — Emergency Department (INDEPENDENT_AMBULATORY_CARE_PROVIDER_SITE_OTHER): Payer: PRIVATE HEALTH INSURANCE

## 2014-03-10 ENCOUNTER — Encounter: Payer: Self-pay | Admitting: Internal Medicine

## 2014-03-10 ENCOUNTER — Emergency Department
Admission: EM | Admit: 2014-03-10 | Discharge: 2014-03-10 | Disposition: A | Discharge status: Home / Self Care | Payer: PRIVATE HEALTH INSURANCE | Source: Home / Self Care | Attending: Emergency Medicine | Admitting: Emergency Medicine

## 2014-03-10 ENCOUNTER — Encounter: Payer: Self-pay | Admitting: *Deleted

## 2014-03-10 VITALS — BP 142/100 | HR 96 | Temp 99.0°F | Resp 12 | Ht 74.0 in | Wt 317.6 lb

## 2014-03-10 DIAGNOSIS — R52 Pain, unspecified: Secondary | ICD-10-CM

## 2014-03-10 DIAGNOSIS — M25561 Pain in right knee: Secondary | ICD-10-CM

## 2014-03-10 DIAGNOSIS — M1711 Unilateral primary osteoarthritis, right knee: Secondary | ICD-10-CM

## 2014-03-10 DIAGNOSIS — E119 Type 2 diabetes mellitus without complications: Secondary | ICD-10-CM

## 2014-03-10 LAB — COMPREHENSIVE METABOLIC PANEL
ALK PHOS: 46 U/L (ref 39–117)
ALT: 58 U/L — AB (ref 0–53)
AST: 43 U/L — ABNORMAL HIGH (ref 0–37)
Albumin: 4.4 g/dL (ref 3.5–5.2)
BUN: 16 mg/dL (ref 6–23)
CO2: 27 mEq/L (ref 19–32)
Calcium: 9.6 mg/dL (ref 8.4–10.5)
Chloride: 102 mEq/L (ref 96–112)
Creatinine, Ser: 1.4 mg/dL (ref 0.4–1.5)
GFR: 56.9 mL/min — ABNORMAL LOW (ref >60.00)
Glucose, Bld: 114 mg/dL — ABNORMAL HIGH (ref 70–99)
POTASSIUM: 3.9 meq/L (ref 3.5–5.1)
SODIUM: 136 meq/L (ref 135–145)
TOTAL PROTEIN: 7.9 g/dL (ref 6.0–8.3)
Total Bilirubin: 0.8 mg/dL (ref 0.2–1.2)

## 2014-03-10 LAB — MICROALBUMIN / CREATININE URINE RATIO
Creatinine,U: 200.4 mg/dL
Microalb Creat Ratio: 11 mg/g (ref 0.0–30.0)
Microalb, Ur: 22.1 mg/dL — ABNORMAL HIGH (ref 0.0–1.9)

## 2014-03-10 LAB — HEMOGLOBIN A1C: HEMOGLOBIN A1C: 7.5 % — AB (ref 4.6–6.5)

## 2014-03-10 MED ORDER — PREDNISONE (PAK) 10 MG PO TABS: ORAL_TABLET | ORAL | Status: DC

## 2014-03-10 MED ORDER — MELOXICAM 15 MG PO TABS: 15.0000 mg | ORAL_TABLET | Freq: Every day | ORAL | Status: DC

## 2014-03-10 MED ORDER — LINAGLIPTIN 5 MG PO TABS: 5.0000 mg | ORAL_TABLET | Freq: Every day | ORAL | Status: DC

## 2014-03-10 MED ORDER — GLUCOPHAGE XR 500 MG PO TB24: 1000.0000 mg | ORAL_TABLET | Freq: Every day | ORAL | Status: DC

## 2014-03-10 NOTE — ED Provider Notes (Signed)
CSN: [id number]     Arrival date & time 03/10/14  0813 History   First MD Initiated Contact with Patient 03/10/14 0815     Chief Complaint  Patient presents with  . Knee Pain   (Consider location/radiation/quality/duration/timing/severity/associated sxs/prior Treatment) HPI Right posterior lateral right knee pain 2 days. It's sharp, worse to palpation or with movement or bending right knee. He can weight-bear, but it hurts to weight-bear. Has tried neoprene knee sleeve which helps somewhat. Has tried ice and Motrin, which has not helped significantly. Denies history of gout. No fever or chills or systemic symptoms. He states he had a similar problem with right knee about 10 years ago, and prednisone helped then. No chest pain or shortness of breath. No abdominal pain or nausea or vomiting. He states his diabetes and hypertension have been controlled, he follows up regularly with PCP. He states that BP has been running 140/90 when checked the past few days. Denies chest pain or shortness of breath. Past Medical History  Diagnosis Date  . Asthma   . Hyperlipidemia   . Hypertension   . Diabetes mellitus   . Gout    History reviewed. No pertinent past surgical history. Family History  Problem Relation Age of Onset  . Breast cancer Mother    History  Substance Use Topics  . Smoking status: Never Smoker   . Smokeless tobacco: Never Used  . Alcohol Use: 0.0 oz/week    0 Not specified per week     Comment: once a month    Review of Systems  All other systems reviewed and are negative.   Allergies  Ace inhibitors and Sulfamethoxazole-trimethoprim  Home Medications   Prior to Admission medications   Medication Sig Start Date End Date Taking? Authorizing Provider  carvedilol (COREG) 12.5 MG tablet Take 1 tablet (12.5 mg total) by mouth 2 (two) times daily with a meal. 11/15/12  Yes Nelwyn SalisburyStephen A Fry, MD  metFORMIN (GLUCOPHAGE) 1000 MG tablet Take 1 tablet (1,000 mg total) by  mouth 2 (two) times daily with a meal. 04/22/13  Yes Nelwyn SalisburyStephen A Fry, MD  rosuvastatin (CRESTOR) 40 MG tablet Take 0.5 tablets (20 mg total) by mouth daily. 04/23/13  Yes Nelwyn SalisburyStephen A Fry, MD  valsartan-hydrochlorothiazide (DIOVAN-HCT) 320-25 MG per tablet Take 1 tablet by mouth daily. 04/22/13  Yes Nelwyn SalisburyStephen A Fry, MD  albuterol [facility](PROAIR HFA) 108 (90 BASE) MCG/ACT inhaler Inhale 2 puffs into the lungs every 6 (six) hours as needed for wheezing. 06/15/10   Madelin HeadingsWanda K Panosh, MD  aspirin 81 MG tablet Take 81 mg by mouth daily.     Historical Provider, MD  meloxicam (MOBIC) 15 MG tablet Take 1 tablet (15 mg total) by mouth daily. For Pain/Inflammation. Take with food. 03/10/14   Lajean Manesavid Massey, MD  predniSONE (STERAPRED UNI-PAK) 10 MG tablet Take as directed for 6 days. 03/10/14   Lajean Manesavid Massey, MD  tadalafil (CIALIS) 20 MG tablet Take 1 tablet (20 mg total) by mouth daily as needed for erectile dysfunction. 02/24/14   Nelwyn SalisburyStephen A Fry, MD   BP 170/110 mmHg  Pulse 108  Resp 18  Wt 310 lb (140.615 kg)  SpO2 97% Physical Exam  Constitutional: He is oriented to person, place, and time. He appears well-developed and well-nourished. No distress.  Uncomfortable from right knee pain. He can weight-bear, but with pain.  HENT:  Head: Normocephalic and atraumatic.  Eyes: Conjunctivae and EOM are normal. Pupils are equal, round, and reactive to light. No scleral icterus.  Neck: Normal range of motion.  Cardiovascular: Normal rate.   Pulmonary/Chest: Effort normal.  Abdominal: He exhibits no distension.  Musculoskeletal:       Right knee: He exhibits decreased range of motion and swelling (minimal). He exhibits no effusion, no ecchymosis and no erythema. Tenderness found. No medial joint line and no lateral joint line tenderness noted.       Legs: No instability. McMurray sign negative  Neurological: He is alert and oriented to person, place, and time. No cranial nerve deficit.  Skin: Skin is warm. No rash noted.    Psychiatric: He has a normal mood and affect.  Nursing note and vitals reviewed.  Both lower extremities: No cyanosis clubbing or edema ED Course  Procedures (including critical care time) Labs Review Labs Reviewed - No data to display  Imaging Review Dg Knee Complete 4 Views Right  03/10/2014   CLINICAL DATA:  Right posterior knee pain, no known injury  EXAM: RIGHT KNEE - COMPLETE 4+ VIEW  COMPARISON:  None.  FINDINGS: Four views of the right knee submitted. No acute fracture or subluxation. There is mild narrowing of lateral joint compartment. No joint effusion.  IMPRESSION: No acute fracture or subluxation. Mild narrowing of lateral joint compartment.   Electronically Signed   By: Natasha MeadLiviu  Pop M.D.   On: 03/10/2014 09:06     MDM    Acute pain of right knee          X-ray right knee shows no acute fracture or subluxation. There is mild narrowing of the lateral joint compartment. No effusion. No clinical evidence of DVT. No heat or redness. No sign of infection.  Treatment options discussed, as well as risks, benefits, alternatives. Patient voiced understanding and agreement with the following plans: Continue neoprene knee sleeve. Heat, rest and gradual increased range of motion. Mobic 15 mg daily Prednisone 10 mg-6 day Dosepak Wrote a note to excuse from work through 12/18. Follow-up with your ortho in 3 days if not improving, or sooner if symptoms become worse. Precautions discussed. Red flags discussed. Questions invited and answered. Patient voiced understanding and agreement.  Also, advised to follow-up with PCP to have BP rechecked within 3 days.     Lajean Manesavid Massey, MD 03/10/14 [phone]

## 2014-03-10 NOTE — ED Notes (Signed)
Zachary [name] c/o right knee pain x 2 days without injury. H/o same problem, knee was drained and injected with steroid. Used Neoprene knee sleeve and Motrin and ice.

## 2014-03-10 NOTE — Patient Instructions (Signed)
Please stop regular metformin. Please start Metformin XR 1000 mg at dinnertime. Please stop at the lab. Will decide about Tradjenta or Invokana after the results are back.  Please return in 1 month with your sugar log.   PATIENT INSTRUCTIONS FOR TYPE 2 DIABETES:  **Please join MyChart!** - see attached instructions about how to join if you have not done so already.  DIET AND EXERCISE Diet and exercise is an important part of diabetic treatment.  We recommended aerobic exercise in the form of brisk walking (working between 40-60% of maximal aerobic capacity, similar to brisk walking) for 150 minutes per week (such as 30 minutes five days per week) along with 3 times per week performing 'resistance' training (using various gauge rubber tubes with handles) 5-10 exercises involving the major muscle groups (upper body, lower body and core) performing 10-15 repetitions (or near fatigue) each exercise. Start at half the above goal but build slowly to reach the above goals. If limited by weight, joint pain, or disability, we recommend daily walking in a swimming pool with water up to waist to reduce pressure from joints while allow for adequate exercise.    BLOOD GLUCOSES Monitoring your blood glucoses is important for continued management of your diabetes. Please check your blood glucoses 2-4 times a day: fasting, before meals and at bedtime (you can rotate these measurements - e.g. one day check before the 3 meals, the next day check before 2 of the meals and before bedtime, etc.).   HYPOGLYCEMIA (low blood sugar) Hypoglycemia is usually a reaction to not eating, exercising, or taking too much insulin/ other diabetes drugs.  Symptoms include tremors, sweating, hunger, confusion, headache, etc. Treat IMMEDIATELY with 15 grams of Carbs: . 4 glucose tablets .  cup regular juice/soda . 2 tablespoons raisins . 4 teaspoons sugar . 1 tablespoon honey Recheck blood glucose in 15 mins and repeat above  if still symptomatic/blood glucose <100.  RECOMMENDATIONS TO REDUCE YOUR RISK OF DIABETIC COMPLICATIONS: * Take your prescribed MEDICATION(S) * Follow a DIABETIC diet: Complex carbs, fiber rich foods, (monounsaturated and polyunsaturated) fats * AVOID saturated/trans fats, high fat foods, >2,300 mg salt per day. * EXERCISE at least 5 times a week for 30 minutes or preferably daily.  * DO NOT SMOKE OR DRINK more than 1 drink a day. * Check your FEET every day. Do not wear tightfitting shoes. Contact us if you develop an ulcer * See your EYE doctor once a year or more if needed * Get a FLU shot once a year * Get a PNEUMONIA vaccine once before and once after age 46 years  GOALS:  * Your Hemoglobin A1c of <7%  * fasting sugars need to be <130 * after meals sugars need to be <180 (2h after you start eating) * Your Systolic BP should be 140 or lower  * Your Diastolic BP should be 80 or lower  * Your HDL (Good Cholesterol) should be 40 or higher  * Your LDL (Bad Cholesterol) should be 100 or lower. * Your Triglycerides should be 150 or lower  * Your Urine microalbumin (kidney function) should be <30 * Your Body Mass Index should be 25 or lower    Please consider the following ways to cut down carbs and fat and increase fiber and micronutrients in your diet: - substitute whole grain for white bread or pasta - substitute brown rice for white rice - substitute 90-calorie flat bread pieces for slices of bread when possible - substitute sweet  potatoes or yams for white potatoes - substitute humus for margarine - substitute tofu for cheese when possible - substitute almond or rice milk for regular milk (would not drink soy milk daily due to concern for soy estrogen influence on breast cancer risk) - substitute dark chocolate for other sweets when possible - substitute water - can add lemon or orange slices for taste - for diet sodas (artificial sweeteners will trick your body that you can eat  sweets without getting calories and will lead you to overeating and weight gain in the long run) - do not skip breakfast or other meals (this will slow down the metabolism and will result in more weight gain over time)  - can try smoothies made from fruit and almond/rice milk in am instead of regular breakfast - can also try old-fashioned (not instant) oatmeal made with almond/rice milk in am - order the dressing on the side when eating salad at a restaurant (pour less than half of the dressing on the salad) - eat as little meat as possible - can try juicing, but should not forget that juicing will get rid of the fiber, so would alternate with eating raw veg./fruits or drinking smoothies - use as little oil as possible, even when using olive oil - can dress a salad with a mix of balsamic vinegar and lemon juice, for e.g. - use agave nectar, stevia sugar, or regular sugar rather than artificial sweateners - steam or broil/roast veggies  - snack on veggies/fruit/nuts (unsalted, preferably) when possible, rather than processed foods - reduce or eliminate aspartame in diet (it is in diet sodas, chewing gum, etc) Read the labels!  Try to read Dr. Katherina RightNeal Barnard's book: "Program for Reversing Diabetes" for other ideas for healthy eating.

## 2014-03-10 NOTE — Progress Notes (Signed)
Patient ID: Zachary [name], male   DOB: 11-11-1965, 48 y.o.   MRN: [id number]  HPI: Zachary [name] is a 48 y.o.-year-old male, referred by his PCP, Dr. Clent RidgesFry, for management of DM2, dx in 2013, non-insulin-dependent, uncontrolled, with complications (CKD, MAU).  Last hemoglobin A1c was: 11/28/2013: HbA1c 9.6% Lab Results  Component Value Date   HGBA1C 7.0* 11/15/2012   HGBA1C 6.5 02/12/2012   HGBA1C 7.1* 11/17/2011   He had periods of noncompliance with Metformin b/c GI issues.  Pt is on a regimen of: - Metformin 1000 mg po bid >> diarrhea  Pt does checks his sugars at home - no meter. - am: 150-160 before starting dieting - 2h after b'fast: n/c - before lunch: n/c - 2h after lunch: n/c - before dinner:  n/c - 2h after dinner: n/c - bedtime:  n/c - nighttime: n/c No lows. Lowest sugar was 86, not recently; ? hypoglycemia awareness Highest sugar was 180, on labwork, fasting.  Pt's meals are: - Breakfast: eggs, bacon, or breakfast burrito + oatmeal; before: soft drink (30 oz) - Lunch: soup and sandwich - Dinner: meat + 2 veggies - Snacks:once daily: granola bar, chips >> now fruit  He quit soft drinks, cut down sugars since last HbA1c >> lost ~15 lbs.  - + CKD, last BUN/creatinine:  11/28/2013: 14/1.4 Lab Results  Component Value Date   BUN 19 10/16/2012   CREATININE 1.3 10/16/2012  ACR (11/2013): 33 - last set of lipids: 11/28/2012: 240/113/48/168 Lab Results  Component Value Date   CHOL 117 10/16/2012   HDL 40.40 10/16/2012   LDLCALC 64 10/16/2012   LDLDIRECT 177.7 11/17/2011   TRIG 65.0 10/16/2012   CHOLHDL 3 10/16/2012   - last eye exam was in 12/2013. [place] - no numbness and tingling in his feet.  Pt has no FH of DM.  ROS: Constitutional: + weight loss, + fatigue, no subjective hyperthermia/hypothermia, + nocturia Eyes: no blurry vision, no xerophthalmia ENT: no sore throat, no nodules palpated in throat, no dysphagia/odynophagia, no  hoarseness Cardiovascular: no CP/SOB/palpitations/leg swelling Respiratory: + cough/SOB Gastrointestinal: no N/V/+ D (metformin)/no C Musculoskeletal: + both: muscle/joint aches Skin: no rashes Neurological: no tremors/numbness/tingling/dizziness Psychiatric: no depression/anxiety + Difficulty with erections   Past Medical History  Diagnosis Date  . Asthma   . Hyperlipidemia   . Hypertension   . Diabetes mellitus   . Gout    Past surgical history: - Wisdom teeth extraction  History   Social History  . Marital Status: Married    Spouse Name: N/A    Number of Children: 2   Social History Main Topics  . Smoking status: Never Smoker   . Smokeless tobacco: Never Used  . Alcohol Use:      2 glasses once a week - red wine        . 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 albuterol (PROAIR HFA) 108 (90 BASE) MCG/ACT inhaler Inhale 2 puffs into the lungs every 6 (six) hours as needed for wheezing. 1 Inhaler 2  . aspirin 81 MG tablet Take 81 mg by mouth daily.     . carvedilol (COREG) 12.5 MG tablet Take 1 tablet (12.5 mg total) by mouth 2 (two) times daily with a meal. 60 tablet 11  . metFORMIN (  GLUCOPHAGE) 1000 MG tablet Take 1 tablet (1,000 mg total) by mouth 2 (two) times daily with a meal. 60 tablet 6  . rosuvastatin (CRESTOR) 40 MG tablet Take 0.5 tablets (20 mg total) by mouth daily. 30 tablet 6  . tadalafil (CIALIS) 20 MG tablet Take 1 tablet (20 mg total) by mouth daily as needed for erectile dysfunction. 10 tablet 11  . valsartan-hydrochlorothiazide (DIOVAN-HCT) 320-25 MG per tablet Take 1 tablet by mouth daily. 30 tablet 6   No current facility-administered medications on file prior to visit.   Allergies  Allergen Reactions  . Ace Inhibitors     REACTION: cough  .  Sulfamethoxazole-Trimethoprim     REACTION: unspecified   Family History  Problem Relation Age of Onset  . Breast cancer Mother    PE: BP 142/100 mmHg  Pulse 96  Temp(Src) 99 F (37.2 C) (Oral)  Resp 12  Ht 6\' 2"  (1.88 m)  Wt 317 lb 9.6 oz (144.062 kg)  BMI 40.76 kg/m2  SpO2 96% Wt Readings from Last 3 Encounters:  03/10/14 317 lb 9.6 oz (144.062 kg)  03/10/14 310 lb (140.615 kg)  02/24/14 319 lb (144.697 kg)   Constitutional: Obese, in NAD, large neck, ruddy complexion Eyes: PERRLA, EOMI, no exophthalmos ENT: moist mucous membranes, no thyromegaly, no cervical lymphadenopathy Cardiovascular: RRR, No MRG Respiratory: CTA B Gastrointestinal: abdomen soft, NT, ND, BS+ Musculoskeletal: no deformities, strength intact in all 4 Skin: moist, warm, no rashes Neurological: no tremor with outstretched hands, DTR normal in all 4  ASSESSMENT: 1. DM2, non-insulin-dependent, uncontrolled, without complications  PLAN:  1. Patient with a recent history of diabetes, which became uncontrolled in the last year. He is on oral antidiabetic regimen with metformin, which can give him diarrhea, but which is also insufficient based on his last hemoglobin A1c which was reviewed along with the patient. Since this result, his study to make drastic changes in his diet and he also lost weight. He is not checking his sugars at home, so his car and diabetes status is unknown. - We discussed about options for treatment, and I suggested to:  Patient Instructions  Please stop regular metformin. Please start Metformin XR 1000 mg at dinnertime. Please stop at the lab. Will decide about Tradjenta or Invokana after the results are back.  Please return in 1 month with your sugar log.   - Strongly advised him to start checking sugars at different times of the day - check 2 times a day, rotating checks - given sugar log and advised how to fill it and to bring it at next appt  - will send a Rx for a meter and  strips to his pharmacy - given foot care handout and explained the principles  - given instructions for hypoglycemia management "15-15 rule"  - advised for yearly eye exams >> he is up-to-date  - We discussed about different ways to lose weight, discussed about gastric bypass surgery,  for now, he will continue with dietary changes  - I offered to refer him to nutrition, and he would like to wait a little longer to see if his current diet works first - Return to clinic in 1 mo with sugar log   Office Visit on 03/10/2014  Component Date Value Ref Range Status  . Hgb A1c MFr Bld 03/10/2014 7.5* 4.6 - 6.5 % Final   Glycemic Control Guidelines for People with Diabetes:Non Diabetic:  <6%Goal of Therapy: <7%Additional Action Suggested:  >8%   . Sodium 03/10/2014  136  135 - 145 mEq/L Final  . Potassium 03/10/2014 3.9  3.5 - 5.1 mEq/L Final  . Chloride 03/10/2014 102  96 - 112 mEq/L Final  . CO2 03/10/2014 27  19 - 32 mEq/L Final  . Glucose, Bld 03/10/2014 114* 70 - 99 mg/dL Final  . BUN 16/12/9602 16  6 - 23 mg/dL Final  . Creatinine, Ser 03/10/2014 1.4  0.4 - 1.5 mg/dL Final  . Total Bilirubin 03/10/2014 0.8  0.2 - 1.2 mg/dL Final  . Alkaline Phosphatase 03/10/2014 46  39 - 117 U/L Final  . AST 03/10/2014 43* 0 - 37 U/L Final  . ALT 03/10/2014 58* 0 - 53 U/L Final  . Total Protein 03/10/2014 7.9  6.0 - 8.3 g/dL Final  . Albumin 54/11/8117 4.4  3.5 - 5.2 g/dL Final  . Calcium 14/78/2956 9.6  8.4 - 10.5 mg/dL Final  . GFR 21/30/8657 56.90* >60.00 mL/min Final  . Microalb, Ur 03/10/2014 22.1* 0.0 - 1.9 mg/dL Final  . Creatinine,U 84/69/6295 200.4   Final  . Microalb Creat Ratio 03/10/2014 11.0  0.0 - 30.0 mg/g Final   Patient's diet (although he only started about a month ago) had a tremendous impact on his hemoglobin A1c, which has decreased 2 percentages in 3 months. His diet and weight loss also reduced his ACR by 66%, from 33 to 11. His LFTs are also a little better. Creatinine is still  1.4.   I will add Tradjenta 5 mg in the morning to his 1000 mg of metformin extended-release daily.

## 2014-03-12 ENCOUNTER — Encounter: Payer: Self-pay | Admitting: Internal Medicine

## 2014-03-12 ENCOUNTER — Encounter: Payer: Self-pay | Admitting: Family Medicine

## 2014-03-12 DIAGNOSIS — E119 Type 2 diabetes mellitus without complications: Secondary | ICD-10-CM

## 2014-03-12 NOTE — Telephone Encounter (Signed)
Tell him that I ordered the lipid panel but he would need to come to the [facility] lab to have this drawn. His last 3 weights are: 319 on 10-14-13, 322 on 11-26-13, and 319 on 02-24-14.

## 2014-03-13 ENCOUNTER — Encounter: Payer: Self-pay | Admitting: Family Medicine

## 2014-03-14 ENCOUNTER — Encounter: Payer: Self-pay | Admitting: Internal Medicine

## 2014-03-17 ENCOUNTER — Other Ambulatory Visit (INDEPENDENT_AMBULATORY_CARE_PROVIDER_SITE_OTHER): Payer: PRIVATE HEALTH INSURANCE

## 2014-03-17 DIAGNOSIS — E119 Type 2 diabetes mellitus without complications: Secondary | ICD-10-CM

## 2014-03-17 LAB — LIPID PANEL
CHOLESTEROL: 124 mg/dL (ref 0–200)
HDL: 33.1 mg/dL — AB (ref >39.00)
LDL CALC: 77 mg/dL (ref 0–99)
NonHDL: 90.9
TRIGLYCERIDES: 72 mg/dL (ref 0.0–149.0)
Total CHOL/HDL Ratio: 4
VLDL: 14.4 mg/dL (ref 0.0–40.0)

## 2014-03-18 ENCOUNTER — Encounter: Payer: Self-pay | Admitting: Family Medicine

## 2014-03-18 NOTE — Telephone Encounter (Signed)
As soon as they are back, we will released them in my chart, hopefully by tomorrow.

## 2014-04-16 ENCOUNTER — Ambulatory Visit (INDEPENDENT_AMBULATORY_CARE_PROVIDER_SITE_OTHER): Payer: PRIVATE HEALTH INSURANCE | Admitting: Internal Medicine

## 2014-04-16 ENCOUNTER — Encounter: Payer: Self-pay | Admitting: Internal Medicine

## 2014-04-16 VITALS — BP 140/88 | HR 72 | Temp 98.6°F | Resp 12 | Wt 320.0 lb

## 2014-04-16 DIAGNOSIS — E119 Type 2 diabetes mellitus without complications: Secondary | ICD-10-CM

## 2014-04-16 MED ORDER — GLUCOSE BLOOD VI STRP: ORAL_STRIP | Status: DC

## 2014-04-16 MED ORDER — GLUCOPHAGE XR 500 MG PO TB24: 1000.0000 mg | ORAL_TABLET | Freq: Every day | ORAL | Status: DC

## 2014-04-16 MED ORDER — ONETOUCH DELICA LANCETS 33G MISC: Status: DC

## 2014-04-16 NOTE — Patient Instructions (Signed)
Please continue regular metformin. ContinueTradjenta 5 mg daily  Please return in 1.5 months with your sugar log.

## 2014-04-16 NOTE — Progress Notes (Signed)
Patient ID: Zachary [name], male   DOB: Feb 16, 1966, 49 y.o.   MRN: [id number]  HPI: Zachary [name] is a 49 y.o.-year-old male, returning for f/u for DM2, dx in 2013, non-insulin-dependent, uncontrolled, with complications (CKD, MAU). Last visit 1 mo ago.  Last hemoglobin A1c was: Lab Results  Component Value Date   HGBA1C 7.5* 03/10/2014   HGBA1C 7.0* 11/15/2012   HGBA1C 6.5 02/12/2012  11/28/2013: HbA1c 9.6% He had periods of noncompliance with Metformin b/c GI issues.  Pt is on a regimen of: - Metformin 1000 mg po bid (had diarrhea with regular Metformin >> at last visit we tried to switch to metformin XR, but he did not get this from the pharmacy but regular metformin >> he does better on the new metformin lot he got, but is interested in switching to XR - Tradjenta 5 mg in am  Pt does checks his sugars at home - she did not have a meter >> got one today [employer]). He was checking sugars sporadically at work - before meals: 110-120  No lows, not recently; ? hypoglycemia awareness  Pt's meals are: - Breakfast: eggs, bacon, or breakfast burrito + oatmeal; before: soft drink (30 oz) - Lunch: soup and sandwich - Dinner: meat + 2 veggies - Snacks:once daily: granola bar, chips >> now fruit  No sugars, no soft drinks! He is having difficulty swallowing this diet  - + CKD, last BUN/creatinine:  11/28/2013: 14/1.4 Lab Results  Component Value Date   BUN 16 03/10/2014   CREATININE 1.4 03/10/2014  ACR (11/2013): 33 - last set of lipids: 11/28/2012: 240/113/48/168 Lab Results  Component Value Date   CHOL 124 03/17/2014   HDL 33.10* 03/17/2014   LDLCALC 77 03/17/2014   LDLDIRECT 177.7 11/17/2011   TRIG 72.0 03/17/2014   CHOLHDL 4 03/17/2014   - last eye exam was in 12/2013. No DR. Kathryne Sharper. - no numbness and tingling in his feet.  ROS: Constitutional: + weight loss (not by our scale), no fatigue, no subjective hyperthermia/hypothermia Eyes: no blurry vision, no  xerophthalmia ENT: no sore throat, no nodules palpated in throat, no dysphagia/odynophagia, no hoarseness Cardiovascular: no CP/SOB/palpitations/leg swelling Respiratory: no cough/SOB Gastrointestinal: no N/V/D/C Musculoskeletal: no muscle/joint aches Skin: no rashes Neurological: no tremors/numbness/tingling/dizziness  I reviewed pt's medications, allergies, PMH, social hx, family hx, and changes were documented in the history of present illness. Otherwise, unchanged from my initial visit note:  Past Medical History  Diagnosis Date  . Asthma   . Hyperlipidemia   . Hypertension   . Diabetes mellitus   . Gout    Past surgical history: - Wisdom teeth extraction  History   Social History  . Marital Status: Married    Spouse Name: N/A    Number of Children: 2   Social History Main Topics  . Smoking status: Never Smoker   . Smokeless tobacco: Never Used  . Alcohol Use:      2 glasses once a week - red wine        . 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 albuterol (PROAIR HFA) 108 (90 BASE) MCG/ACT inhaler Inhale 2 puffs into the lungs every 6 (six) hours as needed for wheezing. 1 Inhaler 2  . aspirin 81 MG tablet Take 81 mg by mouth  daily.     . carvedilol (COREG) 12.5 MG tablet Take 1 tablet (12.5 mg total) by mouth 2 (two) times daily with a meal. 60 tablet 11  . GLUCOPHAGE XR 500 MG 24 hr tablet Take 2 tablets (1,000 mg total) by mouth daily with supper. 30 tablet 2  . linagliptin (TRADJENTA) 5 MG TABS tablet Take 1 tablet (5 mg total) by mouth daily. In am 30 tablet 2  . meloxicam (MOBIC) 15 MG tablet Take 1 tablet (15 mg total) by mouth daily. For Pain/Inflammation. Take with food. 15 tablet 1  . predniSONE (STERAPRED UNI-PAK) 10 MG tablet Take as directed for 6 days. 21 tablet 0  .  rosuvastatin (CRESTOR) 40 MG tablet Take 0.5 tablets (20 mg total) by mouth daily. 30 tablet 6  . tadalafil (CIALIS) 20 MG tablet Take 1 tablet (20 mg total) by mouth daily as needed for erectile dysfunction. 10 tablet 11  . valsartan-hydrochlorothiazide (DIOVAN-HCT) 320-25 MG per tablet Take 1 tablet by mouth daily. 30 tablet 6   No current facility-administered medications on file prior to visit.   Allergies  Allergen Reactions  . Ace Inhibitors     REACTION: cough  . Sulfamethoxazole-Trimethoprim     REACTION: unspecified   Family History  Problem Relation Age of Onset  . Breast cancer Mother    PE: BP 140/88 mmHg  Pulse 72  Temp(Src) 98.6 F (37 C) (Oral)  Resp 12  Wt 320 lb (145.151 kg)  SpO2 98% Body mass index is 41.07 kg/(m^2). Wt Readings from Last 3 Encounters:  04/16/14 320 lb (145.151 kg)  03/10/14 317 lb 9.6 oz (144.062 kg)  03/10/14 310 lb (140.615 kg)   Constitutional: Obese, in NAD, large neck, ruddy complexion Eyes: PERRLA, EOMI, no exophthalmos ENT: moist mucous membranes, no thyromegaly, no cervical lymphadenopathy Cardiovascular: RRR, No MRG Respiratory: CTA B Gastrointestinal: abdomen soft, NT, ND, BS+ Musculoskeletal: no deformities, strength intact in all 4 Skin: moist, warm, no rashes Neurological: no tremor with outstretched hands, DTR normal in all 4  ASSESSMENT: 1. DM2, non-insulin-dependent, uncontrolled, without complications  PLAN:  1. Patient with a recent history of diabetes, which became uncontrolled in the last year. He is on oral antidiabetic regimen with metformin, and we added Tradjenta after last visit, since a hemoglobin A1c returned at 7.5%. This is much improved from before, due to his diet and weight loss. He did not gain a lot of weight over the holidays. Sugars appears to be at goal, however, he did not check sugars at home but only sporadically at work. He was given a new meter (One Touch Verio) and I sent prescriptions for  lancets and strips to his pharmacy. I also gave him a new prescription for extended-release metformin, since he did not get it from the pharmacy. However, he can tolerate better this lot of regular metformin that he got last time from the pharmacy. -  I suggested to continue current regimen for now:  Patient Instructions  Start extended-release metformin 1000 g twice a day. ContinueTradjenta 5 mg daily  Please return in 1.5 months with your sugar log.   - Continue to check 2 times a day, rotating checks - I sent him an email with sugar logs templates ([email or web address] and advised how to fill it and to bring it at next appt   - advised for yearly eye exams >> he is up-to-date  - We again discussed about different ways to lose weight, discussed about gastric bypass surgery,  for  now, I recommended to continue with dietary changes. He is interested in the DuoNeb intragastric balloon, but this is not prime time yet - We discussed about dietary options - Return to clinic in 1.5 mo with sugar log   - time spent with the patient: 25 min, of which >50% was spent in reviewing his sugars, his diabetes regimen, reviewing  previous labs and mostly discussing about diet and other options for weight loss - please see above.

## 2014-04-19 ENCOUNTER — Encounter: Payer: Self-pay | Admitting: Internal Medicine

## 2014-05-11 ENCOUNTER — Other Ambulatory Visit: Payer: Self-pay | Admitting: Family Medicine

## 2014-05-11 NOTE — Telephone Encounter (Signed)
Is this the correct dosage?

## 2014-05-21 ENCOUNTER — Encounter: Payer: Self-pay | Admitting: Internal Medicine

## 2014-05-28 ENCOUNTER — Encounter: Payer: Self-pay | Admitting: Internal Medicine

## 2014-05-28 ENCOUNTER — Ambulatory Visit (INDEPENDENT_AMBULATORY_CARE_PROVIDER_SITE_OTHER): Payer: PRIVATE HEALTH INSURANCE | Admitting: Internal Medicine

## 2014-05-28 VITALS — BP 146/98 | HR 87 | Temp 97.9°F | Resp 12 | Wt 316.0 lb

## 2014-05-28 DIAGNOSIS — E119 Type 2 diabetes mellitus without complications: Secondary | ICD-10-CM

## 2014-05-28 NOTE — Patient Instructions (Signed)
Continue extended-release metformin 1000 g twice a day. ContinueTradjenta 5 mg daily  Please return in 3 months with your sugar log.   Please stop at the lab.

## 2014-05-28 NOTE — Progress Notes (Signed)
Patient ID: Zachary [name], male   DOB: 08-07-1965, 49 y.o.   MRN: [id number]  HPI: Zachary [name] is a 49 y.o.-year-old male, returning for f/u for DM2, dx in 2013, non-insulin-dependent, controlled, with complications (CKD, MAU). Last visit 1.5 mo ago.  Last hemoglobin A1c was: Lab Results  Component Value Date   HGBA1C 7.5* 03/10/2014   HGBA1C 7.0* 11/15/2012   HGBA1C 6.5 02/12/2012  11/28/2013: HbA1c 9.6% He had periods of noncompliance with Metformin b/c GI issues.  Pt is on a regimen of: - Metformin 1000 mg po bid (had diarrhea with regular Metformin >> at last visit we tried to switch to metformin XR, but he did not get this from the pharmacy but regular metformin >> he does better on the new metformin lot he got, but is interested in switching to XR - Tradjenta 5 mg in am  Pt does checks his sugars at home: - am: 98-128, 148 - 2h after b'fast: 108-137 - before lunch: 98-114 - 2h after lunch: 88-132 - before dinner: 86-106, 210x1 - 2h after dinner: 124-140 - bedtime: 81-150, 184x1 (forgot Metformin)  No lows, not recently; ? hypoglycemia awareness  Pt's meals are: - Breakfast: eggs, bacon, or breakfast burrito + oatmeal; before: soft drink (30 oz) - Lunch: soup and sandwich - Dinner: meat + 2 veggies - Snacks:once daily: granola bar, chips >> now fruit  No sugars, no soft drinks! He is having difficulty swallowing this diet  - + CKD, last BUN/creatinine:  11/28/2013: 14/1.4 Lab Results  Component Value Date   BUN 16 03/10/2014   CREATININE 1.4 03/10/2014  ACR (11/2013): 33 - last set of lipids: 11/28/2012: 240/113/48/168 Lab Results  Component Value Date   CHOL 124 03/17/2014   HDL 33.10* 03/17/2014   LDLCALC 77 03/17/2014   LDLDIRECT 177.7 11/17/2011   TRIG 72.0 03/17/2014   CHOLHDL 4 03/17/2014   - last eye exam was in 12/2013. No DR. Kathryne Sharper. - no numbness and tingling in his feet. Foot exam performed today: 05/28/2014.  ROS: Constitutional: +  weight loss , no fatigue, no subjective hyperthermia/hypothermia Eyes: no blurry vision, no xerophthalmia ENT: no sore throat, no nodules palpated in throat, no dysphagia/odynophagia, no hoarseness Cardiovascular: no CP/SOB/palpitations/leg swelling Respiratory: no cough/SOB Gastrointestinal: no N/V/D/C Musculoskeletal: no muscle/joint aches Skin: no rashes Neurological: no tremors/numbness/tingling/dizziness  I reviewed pt's medications, allergies, PMH, social hx, family hx, and changes were documented in the history of present illness. Otherwise, unchanged from my initial visit note:  Past Medical History  Diagnosis Date  . Asthma   . Hyperlipidemia   . Hypertension   . Diabetes mellitus   . Gout    Past surgical history: - Wisdom teeth extraction  History   Social History  . Marital Status: Married    Spouse Name: N/A    Number of Children: 2   Social History Main Topics  . Smoking status: Never Smoker   . Smokeless tobacco: Never Used  . Alcohol Use:      2 glasses once a week - red wine        . 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 albuterol (PROAIR HFA) 108 (90 BASE) MCG/ACT inhaler Inhale 2 puffs into the lungs every 6 (six) hours as needed for wheezing. 1 Inhaler  2  . aspirin 81 MG tablet Take 81 mg by mouth daily.     . carvedilol (COREG) 12.5 MG tablet Take 1 tablet (12.5 mg total) by mouth 2 (two) times daily with a meal. 60 tablet 11  . CRESTOR 40 MG tablet TAKE 0.5 TABLETS (20 MG TOTAL) BY MOUTH DAILY. 30 tablet 11  . GLUCOPHAGE XR 500 MG 24 hr tablet Take 2 tablets (1,000 mg total) by mouth daily with supper. 120 tablet 2  . glucose blood (ONE TOUCH TEST STRIPS) test strip Use 2x a day 200 each 2  . linagliptin (TRADJENTA) 5 MG TABS tablet Take 1 tablet (5 mg total) by  mouth daily. In am 30 tablet 2  . meloxicam (MOBIC) 15 MG tablet Take 1 tablet (15 mg total) by mouth daily. For Pain/Inflammation. Take with food. 15 tablet 1  . ONETOUCH DELICA LANCETS 33G MISC Use 2x a day 100 each 2  . predniSONE (STERAPRED UNI-PAK) 10 MG tablet Take as directed for 6 days. 21 tablet 0  . tadalafil (CIALIS) 20 MG tablet Take 1 tablet (20 mg total) by mouth daily as needed for erectile dysfunction. 10 tablet 11  . valsartan-hydrochlorothiazide (DIOVAN-HCT) 320-25 MG per tablet Take 1 tablet by mouth daily. 30 tablet 6   No current facility-administered medications on file prior to visit.   Allergies  Allergen Reactions  . Ace Inhibitors     REACTION: cough  . Sulfamethoxazole-Trimethoprim     REACTION: unspecified   Family History  Problem Relation Age of Onset  . Breast cancer Mother    PE: BP 146/98 mmHg  Pulse 87  Temp(Src) 97.9 F (36.6 C) (Oral)  Resp 12  Wt 316 lb (143.337 kg)  SpO2 97% Body mass index is 40.55 kg/(m^2). Wt Readings from Last 3 Encounters:  05/28/14 316 lb (143.337 kg)  04/16/14 320 lb (145.151 kg)  03/10/14 317 lb 9.6 oz (144.062 kg)   Constitutional: Obese, in NAD, large neck, ruddy complexion Eyes: PERRLA, EOMI, no exophthalmos ENT: moist mucous membranes, no thyromegaly, no cervical lymphadenopathy Cardiovascular: RRR, No MRG Respiratory: CTA B Gastrointestinal: abdomen soft, NT, ND, BS+ Musculoskeletal: no deformities, strength intact in all 4 Skin: moist, warm, no rashes Neurological: no tremor with outstretched hands, DTR normal in all 4 Foot exam: normal, long toenails  ASSESSMENT: 1. DM2, non-insulin-dependent, controlled, without complications  PLAN:  1. Patient with a recent history of diabetes, on oral antidiabetic regimen with metformin and Tradjenta. Sugars are great! He also brings a great log!  -  I suggested to continue current regimen for now:  Patient Instructions  Continue extended-release metformin 1000  g twice a day. ContinueTradjenta 5 mg daily  Please return in 3 months with your sugar log.   Please stop at the lab.   - Continue to check 2 times a day, rotating checks - advised for yearly eye exams >> he is up-to-date  - we checked a foot exam today >> normal. Advised to clip his toenails. - Will check HbA1c today - Return to clinic in 3 mo with sugar log   Office Visit on 05/28/2014  Component Date Value Ref Range Status  . Hgb A1c MFr Bld 05/28/2014 6.7* 4.6 - 6.5 % Final   Glycemic Control Guidelines for People with Diabetes:Non Diabetic:  <6%Goal of Therapy: <7%Additional Action Suggested:  >8%    HbA1c improved!

## 2014-05-29 LAB — HEMOGLOBIN A1C: Hgb A1c MFr Bld: 6.7 % — ABNORMAL HIGH (ref 4.6–6.5)

## 2014-06-04 ENCOUNTER — Emergency Department
Admission: EM | Admit: 2014-06-04 | Discharge: 2014-06-04 | Disposition: A | Discharge status: Home / Self Care | Payer: PRIVATE HEALTH INSURANCE | Source: Home / Self Care | Attending: Emergency Medicine | Admitting: Emergency Medicine

## 2014-06-04 ENCOUNTER — Encounter: Payer: Self-pay | Admitting: Emergency Medicine

## 2014-06-04 DIAGNOSIS — M25562 Pain in left knee: Secondary | ICD-10-CM

## 2014-06-04 NOTE — ED Notes (Signed)
Left knee pain x 4 days, chronic swelling painful 6/10

## 2014-06-06 NOTE — ED Provider Notes (Signed)
CSN: [id number]     Arrival date & time 06/04/14  1758 History   First MD Initiated Contact with Patient 06/04/14 1809     Chief Complaint  Patient presents with  . Knee Pain  patient presents to [facility] 6:00 PM  HPI Recalls no specific trauma. Complains of diffuse left knee pain and swelling times four days. He's had recurrent episodes of similar acute joint swelling, with a history of gout diagnosis. He denies fever or chills or nausea or vomiting. He can wt bear left lower extremity, but with some pain. Pain is 5/10, both dull and sharp. He tried Motrin and that helped somewhat. Denies history of open wound or tick bite. Denies chest pain or shortness of breath or focal neurologic symptoms. On 03/10/14, he had very similar acute right knee pain and swelling, x-ray showed no acute abnormalities, and he was prescribed prednisone dose pack, but he admits that he had prednisone filled, but never took it, and his right knee symptoms eventually resolved on its own after one week. Although he was advised to follow-up with his PCP and/or orthopedist to further evaluate this and other preventative methods, he has not done so since then. Records show he saw his diabetes specialist 7 days ago, with good diabetes control, A1C 6.7.  Past Medical History  Diagnosis Date  . Asthma   . Hyperlipidemia   . Hypertension   . Diabetes mellitus   . Gout    History reviewed. No pertinent past surgical history. Family History  Problem Relation Age of Onset  . Breast cancer Mother    History  Substance Use Topics  . Smoking status: Never Smoker   . Smokeless tobacco: Never Used  . Alcohol Use: 0.0 oz/week    0 Standard drinks or equivalent per week     Comment: once a month    Review of Systems  All other systems reviewed and are negative.   Allergies  Ace inhibitors and Sulfamethoxazole-trimethoprim  Home Medications   Prior to Admission medications   Medication Sig Start  Date End Date Taking? Authorizing Provider  albuterol (PROAIR HFA) 108 (90 BASE) MCG/ACT inhaler Inhale 2 puffs into the lungs every 6 (six) hours as needed for wheezing. 06/15/10   Madelin Headings, MD  aspirin 81 MG tablet Take 81 mg by mouth daily.     Historical Provider, MD  carvedilol (COREG) 12.5 MG tablet Take 1 tablet (12.5 mg total) by mouth 2 (two) times daily with a meal. 11/15/12   Nelwyn Salisbury, MD  CRESTOR 40 MG tablet TAKE 0.5 TABLETS (20 MG TOTAL) BY MOUTH DAILY. 05/11/14   Nelwyn Salisbury, MD  GLUCOPHAGE XR 500 MG 24 hr tablet Take 2 tablets (1,000 mg total) by mouth daily with supper. 04/16/14   Carlus Pavlov, MD  glucose blood (ONE TOUCH TEST STRIPS) test strip Use 2x a day 04/16/14   Carlus Pavlov, MD  linagliptin (TRADJENTA) 5 MG TABS tablet Take 1 tablet (5 mg total) by mouth daily. In am 03/10/14   Carlus Pavlov, MD  meloxicam (MOBIC) 15 MG tablet Take 1 tablet (15 mg total) by mouth daily. For Pain/Inflammation. Take with food. 03/10/14   Lajean Manes, MD  [facility] DELICA LANCETS 33G MISC Use 2x a day 04/16/14   Carlus Pavlov, MD  predniSONE (STERAPRED UNI-PAK) 10 MG tablet Take as directed for 6 days. 03/10/14   Lajean Manes, MD  tadalafil (CIALIS) 20 MG tablet Take 1 tablet (20 mg total) by  mouth daily as needed for erectile dysfunction. 02/24/14   Nelwyn SalisburyStephen A Fry, MD  valsartan-hydrochlorothiazide (DIOVAN-HCT) 320-25 MG per tablet Take 1 tablet by mouth daily. 04/22/13   Nelwyn SalisburyStephen A Fry, MD   BP 166/118 mmHg  Pulse 79  Temp(Src) 98.8 F (37.1 C) (Oral)  Ht 6\' 3"  (1.905 m)  Wt 313 lb (141.976 kg)  BMI 39.12 kg/m2  SpO2 95% Physical Exam  Constitutional: He is oriented to person, place, and time. He appears well-developed and well-nourished. No distress.  HENT:  Head: Normocephalic and atraumatic.  Eyes: Conjunctivae and EOM are normal. Pupils are equal, round, and reactive to light. No scleral icterus.  Neck: Normal range of motion.  Cardiovascular: Normal rate.    Pulmonary/Chest: Effort normal.  Abdominal: He exhibits no distension.  Musculoskeletal:       Left knee: He exhibits decreased range of motion, swelling, effusion and erythema (and mild warmth). He exhibits no ecchymosis, no laceration and no bony tenderness. Tenderness (mild, diffuse left knee) found.  No calf ttp. No cords. Neg Homans  Neurological: He is alert and oriented to person, place, and time.  Skin: Skin is warm.  Psychiatric: He has a normal mood and affect.  Nursing note and vitals reviewed.  BP repeated 148/96. He states BP usually runs 140/90 range. ED Course  Procedures (including critical care time) Labs Review Labs Reviewed - No data to display  Imaging Review No results found. He declined any x-rays today.  MDM   1. Left knee pain   by history, this is most likely acute gout, or possibly pseudogout. He's afebrile, and no history of open wound, I think it's unlikely that he has septic joint.  Treatment options discussed at length, as well as risks, benefits, alternatives. I advised a short course of colchicine, however there is potential interaction with colchicine and Crestor. I gave him option of holding off Crestor while he's on the colchicine. After discussion, he declined treatment with colchicine.  Patient voiced understanding and agreement with the following plans: Encourage rest, ice, compression with ACE bandage, and elevation of L knee. We fitted him with a supportive elastic knee brace, and that significantly relieved his pain. We discussed the option of NSAIDs, but risks of elevated BP with this. Advised to follow his BP closely, and if  high, to hold off taking ibuprofen. Advised Tylenol for pain. He declined prescription for Vicodin. He states that if not improving in a day, he will take the six-day prednisone dose pack that here he has at home.-- However, I discussed various risks with prednisone, such as raising blood sugar and BP. I advised to  see his orthopedist, Dr. Allie Bossierhris Blackman tomorrow for further definitive diagnosis and treatment, likely are arthrocentesis of left knee, (which is beyond the scope of our abilities here in urgent care).   Also advised to follow-up with PCP within one week to reevaluate BP treatment, check your uric acid, and if high, to discuss options to prevent gout, such as decreasing diuretic and/or taking gout-prophylactic medication chronically.  I explained the risks of not following my medical advice. He voiced understanding and agreement with the above.      Lajean Manesavid Massey, MD 06/06/14 1859

## 2014-06-16 ENCOUNTER — Encounter: Payer: Self-pay | Admitting: Family Medicine

## 2014-06-16 ENCOUNTER — Ambulatory Visit (INDEPENDENT_AMBULATORY_CARE_PROVIDER_SITE_OTHER): Payer: PRIVATE HEALTH INSURANCE | Admitting: Family Medicine

## 2014-06-16 VITALS — BP 134/90 | Temp 98.7°F | Ht 75.0 in | Wt 313.0 lb

## 2014-06-16 DIAGNOSIS — I10 Essential (primary) hypertension: Secondary | ICD-10-CM

## 2014-06-16 DIAGNOSIS — J0100 Acute maxillary sinusitis, unspecified: Secondary | ICD-10-CM

## 2014-06-16 MED ORDER — ALBUTEROL SULFATE HFA 108 (90 BASE) MCG/ACT IN AERS
2.0000 | INHALATION_SPRAY | RESPIRATORY_TRACT | Status: DC | PRN
Start: 2014-06-16 — End: 2015-03-09

## 2014-06-16 MED ORDER — CLARITHROMYCIN 500 MG PO TABS: 500.0000 mg | ORAL_TABLET | Freq: Two times a day (BID) | ORAL | Status: DC

## 2014-06-16 MED ORDER — AMLODIPINE BESYLATE 5 MG PO TABS: 5.0000 mg | ORAL_TABLET | Freq: Every day | ORAL | Status: DC

## 2014-06-16 MED ORDER — HYDROCODONE-HOMATROPINE 5-1.5 MG/5ML PO SYRP: 5.0000 mL | ORAL_SOLUTION | ORAL | Status: DC | PRN

## 2014-06-16 NOTE — Progress Notes (Signed)
   Subjective:    Patient ID: Zachary [name], male    DOB: February 02, 1966, 49 y.o.   MRN: [id number]  HPI Here for one week of sinus pressure, PND, and a dry cough. No fever. His diabetes is doing much better but his BP remains high. His diastolic readings are often around 100. He is trying to lose weight.    Review of Systems  Constitutional: Negative.   HENT: Positive for congestion, postnasal drip and sinus pressure.   Eyes: Negative.   Respiratory: Positive for cough. Negative for shortness of breath.   Cardiovascular: Negative.        Objective:   Physical Exam  Constitutional: He appears well-developed and well-nourished.  HENT:  Right Ear: External ear normal.  Left Ear: External ear normal.  Nose: Nose normal.  Mouth/Throat: Oropharynx is clear and moist.  Eyes: Conjunctivae are normal.  Cardiovascular: Normal rate, regular rhythm, normal heart sounds and intact distal pulses.   Pulmonary/Chest: Effort normal and breath sounds normal.  Lymphadenopathy:    He has no cervical adenopathy.          Assessment & Plan:  Treat the sinusitis with Biaxin. Add Amlodipine 5 mg daily for the HTN. Recheck in one month

## 2014-06-16 NOTE — Progress Notes (Signed)
Pre visit review using our clinic review tool, if applicable. No additional management support is needed unless otherwise documented below in the visit note. 

## 2014-06-17 ENCOUNTER — Telehealth: Payer: Self-pay | Admitting: Family Medicine

## 2014-06-17 NOTE — Telephone Encounter (Signed)
emmi emailed °

## 2014-08-07 ENCOUNTER — Telehealth: Payer: Self-pay | Admitting: Family Medicine

## 2014-08-07 ENCOUNTER — Other Ambulatory Visit: Payer: PRIVATE HEALTH INSURANCE

## 2014-08-07 NOTE — Telephone Encounter (Signed)
Pt would like to add a psa to his cpx labs. Is that ok?

## 2014-08-10 NOTE — Telephone Encounter (Signed)
Please advise 

## 2014-08-10 NOTE — Telephone Encounter (Signed)
Yes please add a PSA

## 2014-08-14 ENCOUNTER — Other Ambulatory Visit (INDEPENDENT_AMBULATORY_CARE_PROVIDER_SITE_OTHER): Payer: PRIVATE HEALTH INSURANCE

## 2014-08-14 DIAGNOSIS — E785 Hyperlipidemia, unspecified: Secondary | ICD-10-CM | POA: Diagnosis not present

## 2014-08-14 DIAGNOSIS — Z0000 Encounter for general adult medical examination without abnormal findings: Secondary | ICD-10-CM | POA: Diagnosis not present

## 2014-08-14 LAB — COMPREHENSIVE METABOLIC PANEL
ALBUMIN: 4.4 g/dL (ref 3.5–5.2)
ALT: 47 U/L (ref 0–53)
AST: 35 U/L (ref 0–37)
Alkaline Phosphatase: 43 U/L (ref 39–117)
BUN: 20 mg/dL (ref 6–23)
CO2: 25 mEq/L (ref 19–32)
Calcium: 9.3 mg/dL (ref 8.4–10.5)
Chloride: 104 mEq/L (ref 96–112)
Creatinine, Ser: 1.37 mg/dL (ref 0.40–1.50)
GFR: 59.2 mL/min — AB (ref >60.00)
GLUCOSE: 132 mg/dL — AB (ref 70–99)
POTASSIUM: 3.8 meq/L (ref 3.5–5.1)
SODIUM: 138 meq/L (ref 135–145)
TOTAL PROTEIN: 7.6 g/dL (ref 6.0–8.3)
Total Bilirubin: 0.8 mg/dL (ref 0.2–1.2)

## 2014-08-14 LAB — TSH: TSH: 2.57 u[IU]/mL (ref 0.35–4.50)

## 2014-08-14 LAB — CBC WITH DIFFERENTIAL/PLATELET
Basophils Absolute: 0 10*3/uL (ref 0.0–0.1)
Basophils Relative: 0.5 % (ref 0.0–3.0)
EOS PCT: 2 % (ref 0.0–5.0)
Eosinophils Absolute: 0.1 10*3/uL (ref 0.0–0.7)
HEMATOCRIT: 47.1 % (ref 39.0–52.0)
Hemoglobin: 16.4 g/dL (ref 13.0–17.0)
Lymphocytes Relative: 30.3 % (ref 12.0–46.0)
Lymphs Abs: 1.8 10*3/uL (ref 0.7–4.0)
MCHC: 34.8 g/dL (ref 30.0–36.0)
MCV: 88.6 fl (ref 78.0–100.0)
MONOS PCT: 6.8 % (ref 3.0–12.0)
Monocytes Absolute: 0.4 10*3/uL (ref 0.1–1.0)
NEUTROS ABS: 3.6 10*3/uL (ref 1.4–7.7)
Neutrophils Relative %: 60.4 % (ref 43.0–77.0)
Platelets: 221 10*3/uL (ref 150.0–400.0)
RBC: 5.32 Mil/uL (ref 4.22–5.81)
RDW: 13.3 % (ref 11.5–15.5)
WBC: 6 10*3/uL (ref 4.0–10.5)

## 2014-08-14 LAB — LIPID PANEL
CHOL/HDL RATIO: 6
Cholesterol: 233 mg/dL — ABNORMAL HIGH (ref 0–200)
HDL: 41.3 mg/dL (ref >39.00)
LDL Cholesterol: 168 mg/dL — ABNORMAL HIGH (ref 0–99)
NONHDL: 191.7
TRIGLYCERIDES: 117 mg/dL (ref 0.0–149.0)
VLDL: 23.4 mg/dL (ref 0.0–40.0)

## 2014-08-14 LAB — MICROALBUMIN / CREATININE URINE RATIO
CREATININE, U: 187 mg/dL
MICROALB/CREAT RATIO: 5.5 mg/g (ref 0.0–30.0)
Microalb, Ur: 10.3 mg/dL — ABNORMAL HIGH (ref 0.0–1.9)

## 2014-08-14 LAB — POCT URINALYSIS DIPSTICK
BILIRUBIN UA: NEGATIVE
Blood, UA: NEGATIVE
Glucose, UA: NEGATIVE
Ketones, UA: NEGATIVE
LEUKOCYTES UA: NEGATIVE
NITRITE UA: NEGATIVE
PH UA: 5
Spec Grav, UA: 1.02
UROBILINOGEN UA: 0.2

## 2014-08-17 ENCOUNTER — Ambulatory Visit (INDEPENDENT_AMBULATORY_CARE_PROVIDER_SITE_OTHER): Payer: PRIVATE HEALTH INSURANCE | Admitting: Family Medicine

## 2014-08-17 ENCOUNTER — Encounter: Payer: Self-pay | Admitting: Family Medicine

## 2014-08-17 VITALS — BP 122/84 | Temp 98.2°F | Ht 75.0 in | Wt 319.0 lb

## 2014-08-17 DIAGNOSIS — Z0000 Encounter for general adult medical examination without abnormal findings: Secondary | ICD-10-CM | POA: Diagnosis not present

## 2014-08-17 NOTE — Progress Notes (Signed)
Pre visit review using our clinic review tool, if applicable. No additional management support is needed unless otherwise documented below in the visit note. 

## 2014-08-17 NOTE — Progress Notes (Signed)
   Subjective:    Patient ID: Zachary [name], male    DOB: 1966-02-15, 49 y.o.   MRN: [id number]  HPI 49 yr old male for a cpx. He feels well. His diabetes is well controlled, as is thr BP. He recently started working with a Systems analystpersonal trainer and he is committed to losing weight.    Review of Systems  Constitutional: Negative.   HENT: Negative.   Eyes: Negative.   Respiratory: Negative.   Cardiovascular: Negative.   Gastrointestinal: Negative.   Genitourinary: Negative.   Musculoskeletal: Negative.   Skin: Negative.   Neurological: Negative.   Psychiatric/Behavioral: Negative.        Objective:   Physical Exam  Constitutional: He is oriented to person, place, and time. He appears well-developed and well-nourished. No distress.  HENT:  Head: Normocephalic and atraumatic.  Right Ear: External ear normal.  Left Ear: External ear normal.  Nose: Nose normal.  Mouth/Throat: Oropharynx is clear and moist. No oropharyngeal exudate.  Eyes: Conjunctivae and EOM are normal. Pupils are equal, round, and reactive to light. Right eye exhibits no discharge. Left eye exhibits no discharge. No scleral icterus.  Neck: Neck supple. No JVD present. No tracheal deviation present. No thyromegaly present.  Cardiovascular: Normal rate, regular rhythm, normal heart sounds and intact distal pulses.  Exam reveals no gallop and no friction rub.   No murmur heard. Pulmonary/Chest: Effort normal and breath sounds normal. No respiratory distress. He has no wheezes. He has no rales. He exhibits no tenderness.  Abdominal: Soft. Bowel sounds are normal. He exhibits no distension and no mass. There is no tenderness. There is no rebound and no guarding.  Genitourinary: Rectum normal, prostate normal and penis normal. Guaiac negative stool. No penile tenderness.  Musculoskeletal: Normal range of motion. He exhibits no edema or tenderness.  Lymphadenopathy:    He has no cervical adenopathy.  Neurological: He is  alert and oriented to person, place, and time. He has normal reflexes. No cranial nerve deficit. He exhibits normal muscle tone. Coordination normal.  Skin: Skin is warm and dry. No rash noted. He is not diaphoretic. No erythema. No pallor.  Psychiatric: He has a normal mood and affect. His behavior is normal. Judgment and thought content normal.          Assessment & Plan:  Well exam. Recheck lipids in 6 months. He will follow up with Dr. Elvera LennoxGherghe

## 2014-08-27 ENCOUNTER — Telehealth: Payer: Self-pay | Admitting: Internal Medicine

## 2014-08-27 NOTE — Telephone Encounter (Signed)
Pt has only one refill left of his meds. Unfortunately had to reschedule his 08/28/14 appt due to work and having to go out of town. He is asking he needs to swing into the [facility] in [place] to get labs so you can rx him enough med to carry him until his rs appt in August please advise  Please send him a my chart message with the decision

## 2014-08-27 NOTE — Telephone Encounter (Signed)
Please read message below.  

## 2014-08-28 ENCOUNTER — Ambulatory Visit: Payer: PRIVATE HEALTH INSURANCE | Admitting: Internal Medicine

## 2014-08-28 ENCOUNTER — Other Ambulatory Visit: Payer: Self-pay | Admitting: *Deleted

## 2014-08-28 DIAGNOSIS — E119 Type 2 diabetes mellitus without complications: Secondary | ICD-10-CM

## 2014-08-28 NOTE — Telephone Encounter (Signed)
OK. Let's get a HbA1c.

## 2014-08-28 NOTE — Telephone Encounter (Signed)
Called Zachary [name] and advised him per Dr Charlean SanfilippoGherghe's message. Zachary [name] to have labs done in [place]. Will advise of dosage change, if needed. Zachary [name] voiced understanding.

## 2014-09-04 ENCOUNTER — Encounter: Payer: Self-pay | Admitting: Internal Medicine

## 2014-09-23 ENCOUNTER — Ambulatory Visit (INDEPENDENT_AMBULATORY_CARE_PROVIDER_SITE_OTHER): Payer: PRIVATE HEALTH INSURANCE | Admitting: Family Medicine

## 2014-09-23 ENCOUNTER — Encounter: Payer: Self-pay | Admitting: Family Medicine

## 2014-09-23 VITALS — BP 124/82 | HR 72 | Temp 98.7°F | Wt 308.0 lb

## 2014-09-23 DIAGNOSIS — N200 Calculus of kidney: Secondary | ICD-10-CM | POA: Diagnosis not present

## 2014-09-23 DIAGNOSIS — N179 Acute kidney failure, unspecified: Secondary | ICD-10-CM

## 2014-09-23 DIAGNOSIS — E119 Type 2 diabetes mellitus without complications: Secondary | ICD-10-CM | POA: Diagnosis not present

## 2014-09-23 DIAGNOSIS — I10 Essential (primary) hypertension: Secondary | ICD-10-CM

## 2014-09-24 ENCOUNTER — Encounter: Payer: Self-pay | Admitting: Family Medicine

## 2014-09-24 DIAGNOSIS — N200 Calculus of kidney: Secondary | ICD-10-CM | POA: Insufficient documentation

## 2014-09-24 LAB — BASIC METABOLIC PANEL
BUN: 16 mg/dL (ref 6–23)
CHLORIDE: 103 meq/L (ref 96–112)
CO2: 31 meq/L (ref 19–32)
CREATININE: 1.45 mg/dL (ref 0.40–1.50)
Calcium: 9.7 mg/dL (ref 8.4–10.5)
GFR: 55.42 mL/min — ABNORMAL LOW (ref >60.00)
GLUCOSE: 98 mg/dL (ref 70–99)
POTASSIUM: 4.7 meq/L (ref 3.5–5.1)
SODIUM: 142 meq/L (ref 135–145)

## 2014-09-24 LAB — HEMOGLOBIN A1C: Hgb A1c MFr Bld: 6 % (ref 4.6–6.5)

## 2014-09-24 NOTE — Progress Notes (Signed)
   Subjective:    Patient ID: Zachary RotundaJames T [name], male    DOB: 12-30-1965, 49 y.o.   MRN: [id number]  HPI Here to follow up on a new diagnosis of kidney stones. [facility]14 with left flank pain, and a CT scan revealed 2 stones, one a tiny one in the lower pole of the left kidney, and the other one a 4mm stone at the left UVJ. There was mild hydronephrosis, and the left kidney was shown to be mildly atrophic. He was seen by a urologist named Dr. Werner LeanSamuel Storch who wanted to give [name] a chance to pass the stone. He was placed on Flomax and Toradol. His renal function was mildly decreased that day, and his creatinine was 1.71 (baseline is 1.3). [facility] and the flank pain has resolved. He as been straining his urine and has not seen a stone pass out yet. He is here to check on is renal function.    Review of Systems  Constitutional: Negative.   Respiratory: Negative.   Cardiovascular: Negative.   Gastrointestinal: Negative.   Genitourinary: Negative.        Objective:   Physical Exam  Constitutional: He appears well-developed and well-nourished.  Cardiovascular: Normal rate, regular rhythm, normal heart sounds and intact distal pulses.   Pulmonary/Chest: Effort normal and breath sounds normal.  Abdominal: Soft. Bowel sounds are normal. He exhibits no distension and no mass. There is no tenderness. There is no rebound and no guarding.          Assessment & Plan:  New diagnosis of kidney stones. The stone in the left ureter has probably passed into the bladder. He is comfortable for the time being. He will follow up with Urology next week, today we will check a BMET to follow the renal function.

## 2014-09-24 NOTE — Telephone Encounter (Signed)
noted 

## 2014-09-25 ENCOUNTER — Encounter: Payer: Self-pay | Admitting: Internal Medicine

## 2014-09-25 ENCOUNTER — Encounter: Payer: Self-pay | Admitting: Family Medicine

## 2014-09-25 MED ORDER — METHYLPREDNISOLONE 4 MG PO TBPK: ORAL_TABLET | ORAL | Status: DC

## 2014-09-25 NOTE — Telephone Encounter (Signed)
This is a duplicate note, waiting on results.

## 2014-09-25 NOTE — Telephone Encounter (Signed)
Call in a Medrol dose pack  

## 2014-10-29 ENCOUNTER — Ambulatory Visit (INDEPENDENT_AMBULATORY_CARE_PROVIDER_SITE_OTHER): Payer: PRIVATE HEALTH INSURANCE | Admitting: Internal Medicine

## 2014-10-29 ENCOUNTER — Encounter: Payer: Self-pay | Admitting: Internal Medicine

## 2014-10-29 ENCOUNTER — Ambulatory Visit: Payer: PRIVATE HEALTH INSURANCE | Admitting: Internal Medicine

## 2014-10-29 ENCOUNTER — Telehealth: Payer: Self-pay | Admitting: Internal Medicine

## 2014-10-29 VITALS — BP 112/64 | HR 73 | Temp 98.4°F | Resp 12 | Wt 301.0 lb

## 2014-10-29 DIAGNOSIS — E1165 Type 2 diabetes mellitus with hyperglycemia: Secondary | ICD-10-CM | POA: Diagnosis not present

## 2014-10-29 MED ORDER — LINAGLIPTIN 5 MG PO TABS: 5.0000 mg | ORAL_TABLET | Freq: Every day | ORAL | Status: DC

## 2014-10-29 MED ORDER — GLUCOPHAGE XR 500 MG PO TB24: 1000.0000 mg | ORAL_TABLET | Freq: Every day | ORAL | Status: DC

## 2014-10-29 NOTE — Telephone Encounter (Signed)
Returned call to pharmacy. They received pt's rx for Glucophage. If he gets brand name is $45; if generic, it's free. Please advise. Thank you.

## 2014-10-29 NOTE — Patient Instructions (Signed)
Please continue extended-release metformin 1000 mg 2x a day. ContinueTradjenta 5 mg daily in am  Please return in 3 months with your sugar log.

## 2014-10-29 NOTE — Telephone Encounter (Signed)
Please call [phone] [facility] regarding rx just called in

## 2014-10-29 NOTE — Telephone Encounter (Signed)
We can try the generic.

## 2014-10-29 NOTE — Progress Notes (Signed)
Patient ID: Zachary [name], male   DOB: 1965-10-07, 49 y.o.   MRN: [id number]  HPI: Zachary [name] is a 49 y.o.-year-old male, returning for f/u for DM2, dx in 2013, non-insulin-dependent, controlled, with complications (CKD, MAU). Last visit 1.5 mo ago.  He had kidney stones since last visit >> uric acid stones.   Continues to lose weight! (diet + exercise).  Last hemoglobin A1c was: Lab Results  Component Value Date   HGBA1C 6.0 09/23/2014   HGBA1C 6.7* 05/28/2014   HGBA1C 7.5* 03/10/2014  11/28/2013: HbA1c 9.6% He had periods of noncompliance with Metformin b/c GI issues.  Pt is on a regimen of: - Metformin XR 1000 mg po bid - Tradjenta 5 mg in am  Pt does check his sugars 2x a day at home: - am: 98-128, 148 >> 90-100 - 2h after b'fast: 108-137 >> 100 - before lunch: 98-114 >> 90-118 - 2h after lunch: 88-132 >> 87-110 - before dinner: 86-106, 210x1 >> 102-110 - 2h after dinner: 124-140 >> n/c - bedtime: 81-150, 184x1 (forgot Metformin) >> 88-100  No lows, not recently; ? hypoglycemia awareness  Pt's meals are: - Breakfast: eggs, bacon, or breakfast burrito + oatmeal; before: soft drink (30 oz) - Lunch: soup and sandwich - Dinner: meat + 2 veggies - Snacks:once daily: granola bar, chips >> now fruit  No sugars, no soft drinks! He now started to work with a Systems analyst.  - + CKD, last BUN/creatinine:  10/27/2014: 18/1.3 Lab Results  Component Value Date   BUN 16 09/23/2014   CREATININE 1.45 09/23/2014  11/28/2013: 14/1.4 ACR (11/2013): 33 - last set of lipids: 11/28/2012: 240/113/48/168 Lab Results  Component Value Date   CHOL 233* 08/14/2014   HDL 41.30 08/14/2014   LDLCALC 168* 08/14/2014   LDLDIRECT 177.7 11/17/2011   TRIG 117.0 08/14/2014   CHOLHDL 6 08/14/2014   - last eye exam was in 12/2013. No DR. Kathryne Sharper. - no numbness and tingling in his feet. Foot exam performed today: 05/28/2014.  ROS: Constitutional: + weight loss , no fatigue, no  subjective hyperthermia/hypothermia Eyes: no blurry vision, no xerophthalmia ENT: no sore throat, no nodules palpated in throat, no dysphagia/odynophagia, no hoarseness Cardiovascular: no CP/SOB/palpitations/leg swelling Respiratory: no cough/SOB Gastrointestinal: no N/V/D/C Musculoskeletal: no muscle/joint aches Skin: no rashes Neurological: no tremors/numbness/tingling/dizziness  I reviewed pt's medications, allergies, PMH, social hx, family hx, and changes were documented in the history of present illness. Otherwise, unchanged from my initial visit note:  Past Medical History  Diagnosis Date  . Asthma   . Hyperlipidemia   . Hypertension   . Diabetes mellitus   . Gout    Past surgical history: - Wisdom teeth extraction  History   Social History  . Marital Status: Married    Spouse Name: N/A    Number of Children: 2   Social History Main Topics  . Smoking status: Never Smoker   . Smokeless tobacco: Never Used  . Alcohol Use:      2 glasses once a week - red wine        . 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 albuterol (PROAIR HFA) 108 (90 BASE) MCG/ACT inhaler Inhale 2 puffs into the lungs every 4 (four) hours as needed for wheezing. 1 Inhaler 2  .  amLODipine (NORVASC) 5 MG tablet Take 1 tablet (5 mg total) by mouth daily. 90 tablet 3  . aspirin 81 MG tablet Take 81 mg by mouth daily.     . carvedilol (COREG) 12.5 MG tablet Take 1 tablet (12.5 mg total) by mouth 2 (two) times daily with a meal. 60 tablet 11  . CRESTOR 40 MG tablet TAKE 0.5 TABLETS (20 MG TOTAL) BY MOUTH DAILY. (Patient not taking: Reported on 08/17/2014) 30 tablet 11  . GLUCOPHAGE XR 500 MG 24 hr tablet Take 2 tablets (1,000 mg total) by mouth daily with supper. 120 tablet 2  . glucose blood (ONE TOUCH TEST STRIPS) test  strip Use 2x a day 200 each 2  . linagliptin (TRADJENTA) 5 MG TABS tablet Take 1 tablet (5 mg total) by mouth daily. In am 30 tablet 2  . methylPREDNISolone (MEDROL DOSEPAK) 4 MG TBPK tablet Take as directed 21 tablet 0  . ONETOUCH DELICA LANCETS 33G MISC Use 2x a day 100 each 2  . valsartan-hydrochlorothiazide (DIOVAN-HCT) 320-25 MG per tablet Take 1 tablet by mouth daily. 30 tablet 6   No current facility-administered medications on file prior to visit.   Allergies  Allergen Reactions  . Ace Inhibitors     REACTION: cough  . Sulfamethoxazole-Trimethoprim     REACTION: unspecified   Family History  Problem Relation Age of Onset  . Breast cancer Mother    PE: BP 112/64 mmHg  Pulse 73  Temp(Src) 98.4 F (36.9 C) (Oral)  Resp 12  Wt 301 lb (136.533 kg)  SpO2 96% Body mass index is 37.62 kg/(m^2). Wt Readings from Last 3 Encounters:  10/29/14 301 lb (136.533 kg)  09/24/14 308 lb (139.708 kg)  08/17/14 319 lb (144.697 kg)   Constitutional: Obese, in NAD, large neck, ruddy complexion Eyes: PERRLA, EOMI, no exophthalmos ENT: moist mucous membranes, no thyromegaly, no cervical lymphadenopathy Cardiovascular: RRR, No MRG Respiratory: CTA B Gastrointestinal: abdomen soft, NT, ND, BS+ Musculoskeletal: no deformities, strength intact in all 4 Skin: moist, warm, no rashes Neurological: no tremor with outstretched hands, DTR normal in all 4  ASSESSMENT: 1. DM2, non-insulin-dependent, controlled, without complications  PLAN:  1. Patient with a recent history of diabetes, on oral antidiabetic regimen with metformin and Tradjenta. Sugars are great! He also brings a great log!  -  I suggested to continue current regimen for now:    Patient Instructions  Please continue extended-release metformin 1000 mg 2x a day. ContinueTradjenta 5 mg daily in am  Please return in 3 months with your sugar log.   - Continue to check 1-2 times a day, rotating checks - advised for yearly eye  exams >> he is up-to-date  - reviewed his last HbA1c today >> 6.0%! (excellent) - Return to clinic in 3 mo with sugar log

## 2014-10-29 NOTE — Telephone Encounter (Signed)
Called pharmacist and advised ok to try generic.

## 2014-11-25 ENCOUNTER — Encounter: Payer: Self-pay | Admitting: Family Medicine

## 2014-11-25 ENCOUNTER — Ambulatory Visit (INDEPENDENT_AMBULATORY_CARE_PROVIDER_SITE_OTHER): Payer: PRIVATE HEALTH INSURANCE | Admitting: Family Medicine

## 2014-11-25 VITALS — BP 144/90 | Temp 98.7°F | Ht 75.0 in | Wt 301.1 lb

## 2014-11-25 DIAGNOSIS — K5900 Constipation, unspecified: Secondary | ICD-10-CM | POA: Diagnosis not present

## 2014-11-25 DIAGNOSIS — K649 Unspecified hemorrhoids: Secondary | ICD-10-CM

## 2014-11-25 MED ORDER — HYDROCORTISONE ACETATE 25 MG RE SUPP: 25.0000 mg | Freq: Two times a day (BID) | RECTAL | Status: DC

## 2014-11-25 NOTE — Progress Notes (Signed)
   Subjective:    Patient ID: Zachary [name], male    DOB: [birth year], 49 y.o.   MRN: [id number]  HPI Here for 3 days of painful swollen hemorroids. No bleeding. This started when he was a little constipated and passed a hard stool. Using Preparation H.    Review of Systems  Constitutional: Negative.   Respiratory: Negative.   Cardiovascular: Negative.   Gastrointestinal: Positive for constipation and rectal pain. Negative for nausea, vomiting, abdominal pain, diarrhea, blood in stool, abdominal distention and anal bleeding.       Objective:   Physical Exam  Constitutional: He appears well-developed and well-nourished.  Abdominal: Soft. Bowel sounds are normal. He exhibits no distension and no mass. There is no tenderness. There is no rebound.  Genitourinary:  3 small inflamed external hemorrhoids          Assessment & Plan:  Hemorrhoids. We discussed ways to keep his stools soft like drinking more water and getting fiber in the diet. Use Anusol HC suppositories prn.

## 2014-11-25 NOTE — Progress Notes (Signed)
Pre visit review using our clinic review tool, if applicable. No additional management support is needed unless otherwise documented below in the visit note. 

## 2015-01-27 ENCOUNTER — Encounter: Payer: Self-pay | Admitting: Family Medicine

## 2015-01-27 ENCOUNTER — Ambulatory Visit (INDEPENDENT_AMBULATORY_CARE_PROVIDER_SITE_OTHER): Payer: PRIVATE HEALTH INSURANCE | Admitting: Family Medicine

## 2015-01-27 VITALS — BP 120/74 | Temp 98.8°F | Ht 75.0 in | Wt 312.0 lb

## 2015-01-27 DIAGNOSIS — L03012 Cellulitis of left finger: Secondary | ICD-10-CM | POA: Diagnosis not present

## 2015-01-27 MED ORDER — CEPHALEXIN 500 MG PO CAPS: 500.0000 mg | ORAL_CAPSULE | Freq: Three times a day (TID) | ORAL | Status: DC

## 2015-01-27 NOTE — Progress Notes (Signed)
   Subjective:    Patient ID: Zachary [name], male    DOB: May 02, 1965, 49 y.o.   MRN: [id number]  HPI Here for 4 days of pain in the left index finger. Soaking in Epsom salts.    Review of Systems  Constitutional: Negative.   Skin: Positive for wound.       Objective:   Physical Exam  Constitutional: He appears well-developed and well-nourished.  Skin:  There is an area of erythema, swelling and tenderness at the edge of the nail on the left index finger          Assessment & Plan:  Paronychia, treat with keflex.

## 2015-01-27 NOTE — Progress Notes (Signed)
Pre visit review using our clinic review tool, if applicable. No additional management support is needed unless otherwise documented below in the visit note. 

## 2015-01-29 ENCOUNTER — Ambulatory Visit: Payer: PRIVATE HEALTH INSURANCE | Admitting: Internal Medicine

## 2015-01-29 DIAGNOSIS — Z0289 Encounter for other administrative examinations: Secondary | ICD-10-CM

## 2015-02-04 ENCOUNTER — Encounter: Payer: Self-pay | Admitting: *Deleted

## 2015-03-09 ENCOUNTER — Encounter: Payer: Self-pay | Admitting: Family Medicine

## 2015-03-09 ENCOUNTER — Ambulatory Visit (INDEPENDENT_AMBULATORY_CARE_PROVIDER_SITE_OTHER): Payer: PRIVATE HEALTH INSURANCE | Admitting: Family Medicine

## 2015-03-09 VITALS — BP 126/98 | HR 83 | Temp 98.7°F | Ht 75.0 in | Wt 315.0 lb

## 2015-03-09 DIAGNOSIS — J209 Acute bronchitis, unspecified: Secondary | ICD-10-CM

## 2015-03-09 MED ORDER — CLARITHROMYCIN 500 MG PO TABS: 500.0000 mg | ORAL_TABLET | Freq: Two times a day (BID) | ORAL | Status: DC

## 2015-03-09 MED ORDER — ALBUTEROL SULFATE HFA 108 (90 BASE) MCG/ACT IN AERS: 2.0000 | INHALATION_SPRAY | RESPIRATORY_TRACT | Status: DC | PRN

## 2015-03-09 MED ORDER — HYDROCODONE-HOMATROPINE 5-1.5 MG/5ML PO SYRP: 5.0000 mL | ORAL_SOLUTION | ORAL | Status: DC | PRN

## 2015-03-09 NOTE — Progress Notes (Signed)
Pre visit review using our clinic review tool, if applicable. No additional management support is needed unless otherwise documented below in the visit note. 

## 2015-03-09 NOTE — Progress Notes (Signed)
   Subjective:    Patient ID: Zachary [name], male    DOB: [birth year], 49 y.o.   MRN: [id number]  HPI Here for 6 days of chest tightness and coughing up yellow sputum. No fever.    Review of Systems  Constitutional: Negative.   HENT: Positive for congestion, postnasal drip and sinus pressure.   Eyes: Negative.   Respiratory: Positive for cough, chest tightness and shortness of breath.   Cardiovascular: Negative.        Objective:   Physical Exam  Constitutional: He appears well-developed and well-nourished.  HENT:  Right Ear: External ear normal.  Left Ear: External ear normal.  Nose: Nose normal.  Mouth/Throat: Oropharynx is clear and moist.  Eyes: Conjunctivae are normal.  Neck: No thyromegaly present.  Cardiovascular: Normal rate, regular rhythm, normal heart sounds and intact distal pulses.   Pulmonary/Chest: Effort normal. No respiratory distress. He has no wheezes. He has no rales.  Scattered rhonchi   Lymphadenopathy:    He has no cervical adenopathy.          Assessment & Plan:  Bronchitis, treat with Biaxin.

## 2015-05-28 ENCOUNTER — Other Ambulatory Visit: Payer: Self-pay | Admitting: Family Medicine

## 2015-06-22 ENCOUNTER — Encounter: Payer: Self-pay | Admitting: Family Medicine

## 2015-06-23 ENCOUNTER — Telehealth: Payer: Self-pay | Admitting: Family Medicine

## 2015-06-23 ENCOUNTER — Encounter: Payer: Self-pay | Admitting: Family Medicine

## 2015-06-23 ENCOUNTER — Other Ambulatory Visit: Payer: Self-pay | Admitting: Family Medicine

## 2015-06-23 NOTE — Telephone Encounter (Signed)
Pt has made cpe for 5/23. Pt sent a note on mychart for refills.  Pt wants to know if he can go to the [place] location to get his labs done? Pt states he lives in [place] and this is convenient.  Also pt wants to know if ok to wait until May for his labs? Pt states he is due for A1C as well. Ok to add?

## 2015-06-23 NOTE — Telephone Encounter (Signed)
The rx for labs is ready

## 2015-06-24 ENCOUNTER — Encounter: Payer: Self-pay | Admitting: Family Medicine

## 2015-06-24 MED ORDER — METFORMIN HCL ER 500 MG PO TB24: 1000.0000 mg | ORAL_TABLET | Freq: Every day | ORAL | Status: DC

## 2015-06-24 MED ORDER — VALSARTAN-HYDROCHLOROTHIAZIDE 320-25 MG PO TABS: 1.0000 | ORAL_TABLET | Freq: Every day | ORAL | Status: DC

## 2015-06-24 MED ORDER — ROSUVASTATIN CALCIUM 40 MG PO TABS: ORAL_TABLET | ORAL | Status: DC

## 2015-06-24 MED ORDER — CARVEDILOL 12.5 MG PO TABS: 12.5000 mg | ORAL_TABLET | Freq: Two times a day (BID) | ORAL | Status: DC

## 2015-06-24 MED ORDER — AMLODIPINE BESYLATE 5 MG PO TABS: 5.0000 mg | ORAL_TABLET | Freq: Every day | ORAL | Status: DC

## 2015-06-24 NOTE — Telephone Encounter (Signed)
I sent pt a my chart message, script is ready to fax, we need a number and then will take care of this.

## 2015-07-07 ENCOUNTER — Encounter: Payer: Self-pay | Admitting: Family Medicine

## 2015-07-08 ENCOUNTER — Other Ambulatory Visit: Payer: Self-pay | Admitting: Adult Health

## 2015-07-08 MED ORDER — HYDROCORTISONE ACETATE 25 MG RE SUPP: 25.0000 mg | Freq: Two times a day (BID) | RECTAL | Status: DC

## 2015-07-12 ENCOUNTER — Encounter: Payer: Self-pay | Admitting: Family Medicine

## 2015-07-12 ENCOUNTER — Ambulatory Visit (INDEPENDENT_AMBULATORY_CARE_PROVIDER_SITE_OTHER): Payer: PRIVATE HEALTH INSURANCE | Admitting: Family Medicine

## 2015-07-12 VITALS — BP 132/98 | HR 87 | Temp 98.9°F | Ht 75.0 in | Wt 323.7 lb

## 2015-07-12 DIAGNOSIS — M109 Gout, unspecified: Secondary | ICD-10-CM

## 2015-07-12 MED ORDER — METHYLPREDNISOLONE ACETATE 80 MG/ML IJ SUSP
160.0000 mg | Freq: Once | INTRAMUSCULAR | Status: AC
  Administered 2015-07-12: 160 mg via INTRAMUSCULAR

## 2015-07-12 MED ORDER — TADALAFIL 20 MG PO TABS: 20.0000 mg | ORAL_TABLET | Freq: Every day | ORAL | Status: DC | PRN

## 2015-07-12 NOTE — Progress Notes (Signed)
Pre visit review using our clinic tool,if applicable. No additional management support is needed unless otherwise documented below in the visit note.  

## 2015-07-12 NOTE — Progress Notes (Signed)
   Subjective:    Patient ID: Zachary [name], male    DOB: 06-05-1965, 50 y.o.   MRN: [id number]  HPI Here for possible gout. He has had severe pain in the right great toe with no recent trauma. He has been working out very hard with a Systems analystpersonal trainer however.    Review of Systems  Constitutional: Negative.   Musculoskeletal: Positive for arthralgias. Negative for joint swelling.       Objective:   Physical Exam  Constitutional: He appears well-developed and well-nourished.  Musculoskeletal:  He is very tender over the right great toe MTP joint. No erythema or warmth or swelling          Assessment & Plan:  Early gout. Given a steroid shot. Recheck prn  Nelwyn SalisburyFRY,STEPHEN A, MD

## 2015-07-15 ENCOUNTER — Telehealth: Payer: Self-pay | Admitting: *Deleted

## 2015-07-15 MED ORDER — SILDENAFIL CITRATE 20 MG PO TABS: 100.0000 mg | ORAL_TABLET | Freq: Once | ORAL | Status: DC | PRN

## 2015-07-15 NOTE — Telephone Encounter (Signed)
Prescription was sent for Cialis, but it is not covered.  Spoke to patient and he would like to get a prescription for Sildenafil 20 mg, as this is inexpensive without insurance. [facility] [place], [place] [phone]

## 2015-07-15 NOTE — Telephone Encounter (Signed)
done

## 2015-07-19 ENCOUNTER — Telehealth: Payer: Self-pay | Admitting: *Deleted

## 2015-07-19 NOTE — Telephone Encounter (Signed)
Prior authorization for Cialis was denied.  Rx for Sildenafil already sent to pharmacy.

## 2015-07-19 NOTE — Telephone Encounter (Signed)
Prior authorization was denied.  Rx for Sildenafil already sent to pharmacy.

## 2015-08-13 ENCOUNTER — Encounter: Payer: Self-pay | Admitting: Family Medicine

## 2015-08-17 ENCOUNTER — Encounter: Payer: PRIVATE HEALTH INSURANCE | Admitting: Family Medicine

## 2015-09-02 ENCOUNTER — Other Ambulatory Visit (INDEPENDENT_AMBULATORY_CARE_PROVIDER_SITE_OTHER): Payer: PRIVATE HEALTH INSURANCE

## 2015-09-02 DIAGNOSIS — Z0000 Encounter for general adult medical examination without abnormal findings: Secondary | ICD-10-CM | POA: Diagnosis not present

## 2015-09-02 LAB — LIPID PANEL
CHOLESTEROL: 158 mg/dL (ref 0–200)
HDL: 42.4 mg/dL (ref >39.00)
LDL CALC: 99 mg/dL (ref 0–99)
NonHDL: 116.05
Total CHOL/HDL Ratio: 4
Triglycerides: 87 mg/dL (ref 0.0–149.0)
VLDL: 17.4 mg/dL (ref 0.0–40.0)

## 2015-09-02 LAB — CBC WITH DIFFERENTIAL/PLATELET
BASOS ABS: 0 10*3/uL (ref 0.0–0.1)
Basophils Relative: 0.4 % (ref 0.0–3.0)
Eosinophils Absolute: 0.1 10*3/uL (ref 0.0–0.7)
Eosinophils Relative: 1.5 % (ref 0.0–5.0)
HCT: 46 % (ref 39.0–52.0)
HEMOGLOBIN: 15.7 g/dL (ref 13.0–17.0)
LYMPHS ABS: 1.8 10*3/uL (ref 0.7–4.0)
LYMPHS PCT: 30.8 % (ref 12.0–46.0)
MCHC: 34.2 g/dL (ref 30.0–36.0)
MCV: 90.1 fl (ref 78.0–100.0)
MONOS PCT: 6.2 % (ref 3.0–12.0)
Monocytes Absolute: 0.4 10*3/uL (ref 0.1–1.0)
Neutro Abs: 3.5 10*3/uL (ref 1.4–7.7)
Neutrophils Relative %: 61.1 % (ref 43.0–77.0)
Platelets: 203 10*3/uL (ref 150.0–400.0)
RBC: 5.11 Mil/uL (ref 4.22–5.81)
RDW: 12.9 % (ref 11.5–15.5)
WBC: 5.7 10*3/uL (ref 4.0–10.5)

## 2015-09-02 LAB — HEPATIC FUNCTION PANEL
ALBUMIN: 4.2 g/dL (ref 3.5–5.2)
ALT: 30 U/L (ref 0–53)
AST: 23 U/L (ref 0–37)
Alkaline Phosphatase: 46 U/L (ref 39–117)
BILIRUBIN TOTAL: 0.9 mg/dL (ref 0.2–1.2)
Bilirubin, Direct: 0.2 mg/dL (ref 0.0–0.3)
Total Protein: 6.8 g/dL (ref 6.0–8.3)

## 2015-09-02 LAB — BASIC METABOLIC PANEL
BUN: 16 mg/dL (ref 6–23)
CO2: 30 mEq/L (ref 19–32)
Calcium: 8.7 mg/dL (ref 8.4–10.5)
Chloride: 104 mEq/L (ref 96–112)
Creatinine, Ser: 1.34 mg/dL (ref 0.40–1.50)
GFR: 60.46 mL/min (ref >60.00)
GLUCOSE: 123 mg/dL — AB (ref 70–99)
POTASSIUM: 4.1 meq/L (ref 3.5–5.1)
SODIUM: 141 meq/L (ref 135–145)

## 2015-09-02 LAB — POC URINALSYSI DIPSTICK (AUTOMATED)
Bilirubin, UA: NEGATIVE
Blood, UA: NEGATIVE
GLUCOSE UA: NEGATIVE
Ketones, UA: NEGATIVE
LEUKOCYTES UA: NEGATIVE
NITRITE UA: NEGATIVE
SPEC GRAV UA: 1.02
UROBILINOGEN UA: 0.2
pH, UA: 7

## 2015-09-02 LAB — MICROALBUMIN / CREATININE URINE RATIO
Creatinine,U: 149 mg/dL
MICROALB UR: 10.7 mg/dL — AB (ref 0.0–1.9)
Microalb Creat Ratio: 7.2 mg/g (ref 0.0–30.0)

## 2015-09-02 LAB — HEMOGLOBIN A1C: HEMOGLOBIN A1C: 6.2 % (ref 4.6–6.5)

## 2015-09-02 LAB — TSH: TSH: 1.7 u[IU]/mL (ref 0.35–4.50)

## 2015-09-03 ENCOUNTER — Encounter: Payer: Self-pay | Admitting: Family Medicine

## 2015-09-03 ENCOUNTER — Ambulatory Visit (INDEPENDENT_AMBULATORY_CARE_PROVIDER_SITE_OTHER): Payer: PRIVATE HEALTH INSURANCE | Admitting: Family Medicine

## 2015-09-03 ENCOUNTER — Telehealth: Payer: Self-pay

## 2015-09-03 VITALS — BP 150/90 | HR 82 | Temp 98.7°F | Ht 74.0 in | Wt 321.0 lb

## 2015-09-03 DIAGNOSIS — Z0000 Encounter for general adult medical examination without abnormal findings: Secondary | ICD-10-CM

## 2015-09-03 NOTE — Progress Notes (Signed)
Pre visit review using our clinic review tool, if applicable. No additional management support is needed unless otherwise documented below in the visit note. 

## 2015-09-03 NOTE — Telephone Encounter (Signed)
-----   Message from Stephen A Fry, MD sent at 09/02/2015  1:10 PM EDT ----- Normal 

## 2015-09-03 NOTE — Telephone Encounter (Signed)
Spoke to patient. Gave results. Patient verbalized understanding. 

## 2015-09-03 NOTE — Progress Notes (Signed)
   Subjective:    Patient ID: Zachary Zachary [name], male    DOB: Apr 17, 1965, 50 y.o.   MRN: [id number]  HPI 50 yr old male for Zachary [name] well exam. He feels well.    Review of Systems  Constitutional: Negative.   HENT: Negative.   Eyes: Negative.   Respiratory: Negative.   Cardiovascular: Negative.   Gastrointestinal: Negative.   Genitourinary: Negative.   Musculoskeletal: Negative.   Skin: Negative.   Neurological: Negative.   Psychiatric/Behavioral: Negative.        Objective:   Physical Exam  Constitutional: He is oriented to person, place, and time. He appears well-developed and well-nourished. No distress.  HENT:  Head: Normocephalic and atraumatic.  Right Ear: External ear normal.  Left Ear: External ear normal.  Nose: Nose normal.  Mouth/Throat: Oropharynx is clear and moist. No oropharyngeal exudate.  Eyes: Conjunctivae and EOM are normal. Pupils are equal, round, and reactive to light. Right eye exhibits no discharge. Left eye exhibits no discharge. No scleral icterus.  Neck: Neck supple. No JVD present. No tracheal deviation present. No thyromegaly present.  Cardiovascular: Normal rate, regular rhythm, normal heart sounds and intact distal pulses.  Exam reveals no gallop and no friction rub.   No murmur heard. EKG normal   Pulmonary/Chest: Effort normal and breath sounds normal. No respiratory distress. He has no wheezes. He has no rales. He exhibits no tenderness.  Abdominal: Soft. Bowel sounds are normal. He exhibits no distension and no mass. There is no tenderness. There is no rebound and no guarding.  Genitourinary: Rectum normal, prostate normal and penis normal. Guaiac negative stool. No penile tenderness.  Musculoskeletal: Normal range of motion. He exhibits no edema or tenderness.  Lymphadenopathy:    He has no cervical adenopathy.  Neurological: He is alert and oriented to person, place, and time. He has normal reflexes. No cranial nerve deficit. He exhibits normal  muscle tone. Coordination normal.  Skin: Skin is warm and dry. No rash noted. He is not diaphoretic. No erythema. No pallor.  Psychiatric: He has Zachary [name] normal mood and affect. His behavior is normal. Judgment and thought content normal.          Assessment & Plan:  Well exam. We discussed diet and exercise.  Zachary Zachary [name],Zachary Zachary [name] A, MD

## 2015-09-04 ENCOUNTER — Other Ambulatory Visit: Payer: Self-pay | Admitting: Family Medicine

## 2015-09-24 ENCOUNTER — Ambulatory Visit (INDEPENDENT_AMBULATORY_CARE_PROVIDER_SITE_OTHER): Payer: PRIVATE HEALTH INSURANCE | Admitting: Family Medicine

## 2015-09-24 ENCOUNTER — Encounter: Payer: Self-pay | Admitting: Family Medicine

## 2015-09-24 VITALS — BP 150/98 | Temp 98.5°F

## 2015-09-24 DIAGNOSIS — S86811A Strain of other muscle(s) and tendon(s) at lower leg level, right leg, initial encounter: Secondary | ICD-10-CM | POA: Diagnosis not present

## 2015-09-24 DIAGNOSIS — S86911A Strain of unspecified muscle(s) and tendon(s) at lower leg level, right leg, initial encounter: Secondary | ICD-10-CM

## 2015-09-24 DIAGNOSIS — I10 Essential (primary) hypertension: Secondary | ICD-10-CM

## 2015-09-24 MED ORDER — AMLODIPINE BESYLATE 10 MG PO TABS: 10.0000 mg | ORAL_TABLET | Freq: Every day | ORAL | Status: DC

## 2015-09-24 NOTE — Progress Notes (Signed)
Pre visit review using our clinic review tool, if applicable. No additional management support is needed unless otherwise documented below in the visit note. Pt unable to weigh 

## 2015-09-24 NOTE — Progress Notes (Signed)
   Subjective:    Patient ID: Zachary [name], male    DOB: 04-15-1965, 50 y.o.   MRN: [id number]  HPI Here for the onset this am of pain in the posterior right knee. No recent trauma but he has been doing a very strenuous workout 5 days a week in which he does a lot of lunges and jumps. He did one such workout yesterday afternoon. Also he has been having high BP readings for several months.    Review of Systems  Constitutional: Negative.   Respiratory: Negative.   Cardiovascular: Negative.   Musculoskeletal: Positive for arthralgias. Negative for joint swelling.  Neurological: Negative.        Objective:   Physical Exam  Constitutional: He is oriented to person, place, and time. He appears well-developed and well-nourished.  Cardiovascular: Normal rate, regular rhythm, normal heart sounds and intact distal pulses.   Pulmonary/Chest: Effort normal and breath sounds normal.  Musculoskeletal: He exhibits no edema.  He is tender in the posterior right knee along the distal lateral hamstring tendon, no swelling. ROM is limited to both flexion and extension  Neurological: He is alert and oriented to person, place, and time.          Assessment & Plan:  He has a hamstring strain, so he needs to rest and ice the area. Use Ibuprofen prn. Written out of work today and tomorrow. His HTN is not well controlled so we will increase the Amlodipine to 10 mg daily.  Nelwyn SalisburyFRY,Dal Blew A, MD

## 2015-10-04 ENCOUNTER — Ambulatory Visit (INDEPENDENT_AMBULATORY_CARE_PROVIDER_SITE_OTHER): Payer: PRIVATE HEALTH INSURANCE | Admitting: Internal Medicine

## 2015-10-04 ENCOUNTER — Encounter: Payer: Self-pay | Admitting: Internal Medicine

## 2015-10-04 VITALS — BP 132/80 | Temp 98.7°F | Wt 326.3 lb

## 2015-10-04 DIAGNOSIS — M25561 Pain in right knee: Secondary | ICD-10-CM | POA: Diagnosis not present

## 2015-10-04 NOTE — Progress Notes (Signed)
Pre visit review using our clinic review tool, if applicable. No additional management support is needed unless otherwise documented below in the visit note.  Chief Complaint  Patient presents with  . Follow-up    HPI: Zachary [name] 50 y.o.  Comes in for sda  fu of  Right  Knee pain  See past notes dr Clent Ridges poss hamstring injury pt helped some and back tpo work and began limping again with pain  In am .    Had been doing squats via trainer before  Onset of all of this   But  Had to stop .   Pt just started IT band  intervnetion ? If helps.   Remote hs of  rig t  knee effusion   Saw dr Gayla Doss at that time  No dx?  ROS: See pertinent positives and negatives per HPI. No fever falling  Does descibe locking at some point.   Past Medical History  Diagnosis Date  . Asthma   . Hyperlipidemia   . Hypertension   . Gout   . Diabetes mellitus     sees Dr. Elvera Lennox     Family History  Problem Relation Age of Onset  . Breast cancer Mother     Social History   Social History  . Marital Status: Married    Spouse Name: N/A  . Number of Children: N/A  . Years of Education: N/A   Social History Main Topics  . Smoking status: Never Smoker   . Smokeless tobacco: Never Used  . Alcohol Use: 0.0 oz/week    0 Standard drinks or equivalent per week     Comment: once a month  . Drug Use: No  . Sexual Activity: Not Asked   Other Topics Concern  . 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 albuterol (PROAIR HFA) 108 (90 BASE) MCG/ACT inhaler Inhale 2 puffs into the lungs every 4 (four) hours as needed for wheezing. 1 Inhaler 5  . amLODipine (NORVASC) 10 MG tablet Take 1 tablet (10 mg total) by mouth daily. 90 tablet 3  . aspirin 81 MG tablet Take 81 mg by mouth daily.     . carvedilol (COREG) 12.5 MG tablet Take 1 tablet  (12.5 mg total) by mouth 2 (two) times daily with a meal. 60 tablet 11  . glucose blood (ONE TOUCH TEST STRIPS) test strip Use 2x a day 200 each 2  . metFORMIN (GLUCOPHAGE-XR) 500 MG 24 hr tablet Take 2 tablets (1,000 mg total) by mouth daily with breakfast. 60 tablet 11  . ONETOUCH DELICA LANCETS 33G MISC Use 2x a day 100 each 2  . rosuvastatin (CRESTOR) 40 MG tablet TAKE 1/2 TABLETS (20 MG TOTAL) BY MOUTH DAILY. 30 tablet 11  . sildenafil (REVATIO) 20 MG tablet Take 5 tablets (100 mg total) by mouth once as needed. 50 tablet 11  . valsartan-hydrochlorothiazide (DIOVAN-HCT) 320-25 MG tablet Take 1 tablet by mouth daily. 30 tablet 11   No facility-administered medications prior to visit.     EXAM:  BP 132/80 mmHg  Temp(Src) 98.7 F (37.1 C) (Oral)  Wt 326 lb 4.8 oz (148.009 kg)  Body mass index is 41.88 kg/(m^2).  GENERAL: vitals reviewed and listed above, alert, oriented, appears well hydrated and in no acute  distress HEENT: atraumatic, conjunctiva  clear, no obvious abnormalities on inspection of external nose and ears  MS: moves all extremities   Cannot  extend right knee  uptup to 150 ok  Tender  swellin lasteral  Patellar  Quat attachment   No crepitus  Drawer ? 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   Advise  SM or Ortho patient request dr Lanny Cramp of choice .  -Patient advised to return or notify health care team  if symptoms worsen ,persist or new concerns arise.  Patient Instructions  Will do referral   To dr Magnus Ivan as  Planned   Sports med   And or ortho.      Try  naproxyn  2.5 aleve.   ? Iliotibial band  Plus knee ?  Ice  Avoid sqats   past 90 degrees  dont .   Generic Knee Exercises EXERCISES RANGE OF MOTION (ROM) AND STRETCHING EXERCISES These exercises may help you when beginning to rehabilitate your injury. Your symptoms may resolve with or without further involvement from your physician, physical therapist, or athletic trainer. While completing these exercises, remember:   Restoring tissue flexibility helps normal motion to return to the joints. This allows healthier, less painful movement and activity.  An effective stretch should be held for at least 30 seconds.  A stretch should never be painful. You should only feel a gentle lengthening or release in the stretched tissue. STRETCH - Knee Extension, Prone  Lie on your stomach on a firm surface, such as a bed or countertop. Place your right / left knee and leg just beyond the edge of the surface. You may wish to place a towel under the far end of your right / left thigh for comfort.  Relax your leg muscles and allow gravity to straighten your knee. Your clinician may advise you to add an ankle weight if more resistance is helpful for you.  You should feel a stretch in the back of your right / left knee. Hold this position for __________ seconds. Repeat __________ times. Complete this stretch __________ times per day. * Your physician, physical therapist, or athletic trainer may ask you to add ankle weight to enhance your stretch.  RANGE OF MOTION - Knee Flexion, Active  Lie on your back with both knees straight. (If this causes back discomfort, bend your opposite knee, placing your foot flat on the floor.)  Slowly slide your heel back toward your buttocks until you feel a gentle stretch in the front of your knee or thigh.  Hold for __________ seconds. Slowly slide your heel back to the starting position. Repeat __________ times. Complete this exercise __________ times per day.   STRETCH - Quadriceps, Prone   Lie on your stomach on a firm surface, such as a bed or padded floor.  Bend your right / left knee and grasp your ankle. If you are unable to reach your ankle or pant leg, use a belt around your foot to lengthen your reach.  Gently pull  your heel toward your buttocks. Your knee should not slide out to the side. You should feel a stretch in the front of your thigh and/or knee.  Hold this position for __________ seconds. Repeat __________ times. Complete this stretch __________ times per day.  STRETCH - Hamstrings, Supine   Lie on your back. Loop a belt or towel over the ball of your right / left foot.  Straighten your right / left knee and slowly pull on the belt to raise your leg. Do not allow the right / left knee to bend. Keep your opposite leg flat on the floor.  Raise the leg until you feel a gentle stretch behind your right / left knee or thigh. Hold this position for __________ seconds. Repeat __________ times. Complete this stretch __________ times per day.  STRENGTHENING EXERCISES These exercises may help you when beginning to rehabilitate your injury. They may resolve your symptoms with or without further involvement from your physician, physical therapist, or athletic trainer. While completing these exercises, remember:   Muscles can gain both the endurance and the strength needed for everyday activities through controlled exercises.  Complete these exercises as instructed by your physician, physical therapist, or athletic trainer. Progress the resistance and repetitions only as guided.  You may experience muscle soreness or fatigue, but the pain or discomfort you are trying to eliminate should never worsen during these exercises. If this pain does worsen, stop and make certain you are following the directions exactly. If the pain is still present after adjustments, discontinue the exercise until you can discuss the trouble with your  clinician. STRENGTH - Quadriceps, Isometrics  Lie on your back with your right / left leg extended and your opposite knee bent.  Gradually tense the muscles in the front of your right / left thigh. You should see either your knee cap slide up toward your hip or increased dimpling just above the knee. This motion will push the back of the knee down toward the floor/mat/bed on which you are lying.  Hold the muscle as tight as you can without increasing your pain for __________ seconds.  Relax the muscles slowly and completely in between each repetition. Repeat __________ times. Complete this exercise __________ times per day.  STRENGTH - Quadriceps, Short Arcs   Lie on your back. Place a __________ inch towel roll under your knee so that the knee slightly bends.  Raise only your lower leg by tightening the muscles in the front of your thigh. Do not allow your thigh to rise.  Hold this position for __________ seconds. Repeat __________ times. Complete this exercise __________ times per day.  OPTIONAL ANKLE WEIGHTS: Begin with ____________________, but DO NOT exceed ____________________. Increase in 1 pound/0.5 kilogram increments.  STRENGTH - Quadriceps, Straight Leg Raises  Quality counts! Watch for signs that the quadriceps muscle is working to insure you are strengthening the correct muscles and not "cheating" by substituting with healthier muscles.  Lay on your back with your right / left leg extended and your opposite knee bent.  Tense the muscles in the front of your right / left thigh. You should see either your knee cap slide up or increased dimpling just above the knee. Your thigh may even quiver.  Tighten these muscles even more and raise your leg 4 to 6 inches off the floor. Hold for __________ seconds.  Keeping these muscles tense, lower your leg.  Relax the muscles slowly and completely in between each repetition. Repeat __________ times. Complete this  exercise __________  times per day.  STRENGTH - Hamstring, Curls  Lay on your stomach with your legs extended. (If you lay on a bed, your feet may hang over the edge.)  Tighten the muscles in the back of your thigh to bend your right / left knee up to 90 degrees. Keep your hips flat on the bed/floor.  Hold this position for __________ seconds.  Slowly lower your leg back to the starting position. Repeat __________ times. Complete this exercise __________ times per day.  OPTIONAL ANKLE WEIGHTS: Begin with ____________________, but DO NOT exceed ____________________. Increase in 1 pound/0.5 kilogram increments.  STRENGTH - Quadriceps, Squats  Stand in a door frame so that your feet and knees are in line with the frame.  Use your hands for balance, not support, on the frame.  Slowly lower your weight, bending at the hips and knees. Keep your lower legs upright so that they are parallel with the door frame. Squat only within the range that does not increase your knee pain. Never let your hips drop below your knees.  Slowly return upright, pushing with your legs, not pulling with your hands. Repeat __________ times. Complete this exercise __________ times per day.  STRENGTH - Quadriceps, Wall Slides  Follow guidelines for form closely. Increased knee pain often results from poorly placed feet or knees.  Lean against a smooth wall or door and walk your feet out 18-24 inches. Place your feet hip-width apart.  Slowly slide down the wall or door until your knees bend __________ degrees.* Keep your knees over your heels, not your toes, and in line with your hips, not falling to either side.  Hold for __________ seconds. Stand up to rest for __________ seconds in between each repetition. Repeat __________ times. Complete this exercise __________ times per day. * Your physician, physical therapist, or athletic trainer will alter this angle based on your symptoms and progress.   This information is not intended to  replace advice given to you by your health care provider. Make sure you discuss any questions you have with your health care provider.   Document Released: 01/25/2005 Document Revised: 04/03/2014 Document Reviewed: 06/25/2008 Elsevier Interactive Patient Education 2016 [employer]       Bella VillaWanda K. Clariza Sickman M.D.

## 2015-10-04 NOTE — Patient Instructions (Addendum)
Will do referral   To dr Magnus IvanBlackman as  Planned   Sports med   And or ortho.    Try  naproxyn  2.5 aleve.   ? Iliotibial band  Plus knee ?  Ice  Avoid sqats   past 90 degrees  dont .   Generic Knee Exercises EXERCISES RANGE OF MOTION (ROM) AND STRETCHING EXERCISES These exercises may help you when beginning to rehabilitate your injury. Your symptoms may resolve with or without further involvement from your physician, physical therapist, or athletic trainer. While completing these exercises, remember:   Restoring tissue flexibility helps normal motion to return to the joints. This allows healthier, less painful movement and activity.  An effective stretch should be held for at least 30 seconds.  A stretch should never be painful. You should only feel a gentle lengthening or release in the stretched tissue. STRETCH - Knee Extension, Prone  Lie on your stomach on a firm surface, such as a bed or countertop. Place your right / left knee and leg just beyond the edge of the surface. You may wish to place a towel under the far end of your right / left thigh for comfort.  Relax your leg muscles and allow gravity to straighten your knee. Your clinician may advise you to add an ankle weight if more resistance is helpful for you.  You should feel a stretch in the back of your right / left knee. Hold this position for __________ seconds. Repeat __________ times. Complete this stretch __________ times per day. * Your physician, physical therapist, or athletic trainer may ask you to add ankle weight to enhance your stretch.  RANGE OF MOTION - Knee Flexion, Active  Lie on your back with both knees straight. (If this causes back discomfort, bend your opposite knee, placing your foot flat on the floor.)  Slowly slide your heel back toward your buttocks until you feel a gentle stretch in the front of your knee or thigh.  Hold for __________ seconds. Slowly slide your heel back to the starting  position. Repeat __________ times. Complete this exercise __________ times per day.  STRETCH - Quadriceps, Prone   Lie on your stomach on a firm surface, such as a bed or padded floor.  Bend your right / left knee and grasp your ankle. If you are unable to reach your ankle or pant leg, use a belt around your foot to lengthen your reach.  Gently pull your heel toward your buttocks. Your knee should not slide out to the side. You should feel a stretch in the front of your thigh and/or knee.  Hold this position for __________ seconds. Repeat __________ times. Complete this stretch __________ times per day.  STRETCH - Hamstrings, Supine   Lie on your back. Loop a belt or towel over the ball of your right / left foot.  Straighten your right / left knee and slowly pull on the belt to raise your leg. Do not allow the right / left knee to bend. Keep your opposite leg flat on the floor.  Raise the leg until you feel a gentle stretch behind your right / left knee or thigh. Hold this position for __________ seconds. Repeat __________ times. Complete this stretch __________ times per day.  STRENGTHENING EXERCISES These exercises may help you when beginning to rehabilitate your injury. They may resolve your symptoms with or without further involvement from your physician, physical therapist, or athletic trainer. While completing these exercises, remember:   Muscles can  gain both the endurance and the strength needed for everyday activities through controlled exercises.  Complete these exercises as instructed by your physician, physical therapist, or athletic trainer. Progress the resistance and repetitions only as guided.  You may experience muscle soreness or fatigue, but the pain or discomfort you are trying to eliminate should never worsen during these exercises. If this pain does worsen, stop and make certain you are following the directions exactly. If the pain is still present after adjustments,  discontinue the exercise until you can discuss the trouble with your clinician. STRENGTH - Quadriceps, Isometrics  Lie on your back with your right / left leg extended and your opposite knee bent.  Gradually tense the muscles in the front of your right / left thigh. You should see either your knee cap slide up toward your hip or increased dimpling just above the knee. This motion will push the back of the knee down toward the floor/mat/bed on which you are lying.  Hold the muscle as tight as you can without increasing your pain for __________ seconds.  Relax the muscles slowly and completely in between each repetition. Repeat __________ times. Complete this exercise __________ times per day.  STRENGTH - Quadriceps, Short Arcs   Lie on your back. Place a __________ inch towel roll under your knee so that the knee slightly bends.  Raise only your lower leg by tightening the muscles in the front of your thigh. Do not allow your thigh to rise.  Hold this position for __________ seconds. Repeat __________ times. Complete this exercise __________ times per day.  OPTIONAL ANKLE WEIGHTS: Begin with ____________________, but DO NOT exceed ____________________. Increase in 1 pound/0.5 kilogram increments.  STRENGTH - Quadriceps, Straight Leg Raises  Quality counts! Watch for signs that the quadriceps muscle is working to insure you are strengthening the correct muscles and not "cheating" by substituting with healthier muscles.  Lay on your back with your right / left leg extended and your opposite knee bent.  Tense the muscles in the front of your right / left thigh. You should see either your knee cap slide up or increased dimpling just above the knee. Your thigh may even quiver.  Tighten these muscles even more and raise your leg 4 to 6 inches off the floor. Hold for __________ seconds.  Keeping these muscles tense, lower your leg.  Relax the muscles slowly and completely in between each  repetition. Repeat __________ times. Complete this exercise __________ times per day.  STRENGTH - Hamstring, Curls  Lay on your stomach with your legs extended. (If you lay on a bed, your feet may hang over the edge.)  Tighten the muscles in the back of your thigh to bend your right / left knee up to 90 degrees. Keep your hips flat on the bed/floor.  Hold this position for __________ seconds.  Slowly lower your leg back to the starting position. Repeat __________ times. Complete this exercise __________ times per day.  OPTIONAL ANKLE WEIGHTS: Begin with ____________________, but DO NOT exceed ____________________. Increase in 1 pound/0.5 kilogram increments.  STRENGTH - Quadriceps, Squats  Stand in a door frame so that your feet and knees are in line with the frame.  Use your hands for balance, not support, on the frame.  Slowly lower your weight, bending at the hips and knees. Keep your lower legs upright so that they are parallel with the door frame. Squat only within the range that does not increase your knee pain. Never let  your hips drop below your knees.  Slowly return upright, pushing with your legs, not pulling with your hands. Repeat __________ times. Complete this exercise __________ times per day.  STRENGTH - Quadriceps, Wall Slides  Follow guidelines for form closely. Increased knee pain often results from poorly placed feet or knees.  Lean against a smooth wall or door and walk your feet out 18-24 inches. Place your feet hip-width apart.  Slowly slide down the wall or door until your knees bend __________ degrees.* Keep your knees over your heels, not your toes, and in line with your hips, not falling to either side.  Hold for __________ seconds. Stand up to rest for __________ seconds in between each repetition. Repeat __________ times. Complete this exercise __________ times per day. * Your physician, physical therapist, or athletic trainer will alter this angle based  on your symptoms and progress.   This information is not intended to replace advice given to you by your health care provider. Make sure you discuss any questions you have with your health care provider.   Document Released: 01/25/2005 Document Revised: 04/03/2014 Document Reviewed: 06/25/2008 Elsevier Interactive Patient Education [employer]

## 2015-12-28 ENCOUNTER — Ambulatory Visit (INDEPENDENT_AMBULATORY_CARE_PROVIDER_SITE_OTHER): Payer: PRIVATE HEALTH INSURANCE | Admitting: Family Medicine

## 2015-12-28 ENCOUNTER — Encounter: Payer: Self-pay | Admitting: Family Medicine

## 2015-12-28 VITALS — BP 138/96 | Temp 99.0°F | Ht 74.0 in | Wt 332.0 lb

## 2015-12-28 DIAGNOSIS — M25532 Pain in left wrist: Secondary | ICD-10-CM | POA: Diagnosis not present

## 2015-12-28 MED ORDER — METHYLPREDNISOLONE 4 MG PO TBPK: ORAL_TABLET | ORAL | 0 refills | Status: DC

## 2015-12-28 MED ORDER — METHYLPREDNISOLONE ACETATE 80 MG/ML IJ SUSP
160.0000 mg | Freq: Once | INTRAMUSCULAR | Status: AC
  Administered 2015-12-28: 160 mg via INTRAMUSCULAR

## 2015-12-28 NOTE — Progress Notes (Signed)
Pre visit review using our clinic review tool, if applicable. No additional management support is needed unless otherwise documented below in the visit note. 

## 2015-12-28 NOTE — Progress Notes (Signed)
   Subjective:    Patient ID: Zachary [name], male    DOB: Sep 26, 1965, 50 y.o.   MRN: [id number]  HPI Here for pain in the left wrist since an injury 4 days ago. While doing curls in the gym he felt a sudden sharp pain in the wrist, but he did not feel or hear a "pop". The wrist has been swollen and very painful since then. He is taking Mobic and applying ice. He has been wearing a splint.    Review of Systems  Constitutional: Negative.   Musculoskeletal: Positive for arthralgias and joint swelling.       Objective:   Physical Exam  Constitutional: He appears well-developed and well-nourished.  Musculoskeletal:  The left wrist is swollen and quite tender. ROM is limited by pain          Assessment & Plan:  Wrist sprain. Given a shot of Depomedrol and he will start a Medrol dose pack. Refer to Orthopedics.  Nelwyn SalisburyFRY,STEPHEN A, MD

## 2015-12-28 NOTE — Addendum Note (Signed)
Addended by: Aniceto BossNIMMONS, SYLVIA A on: 12/28/2015 01:46 PM   Modules accepted: Orders

## 2015-12-29 ENCOUNTER — Ambulatory Visit (INDEPENDENT_AMBULATORY_CARE_PROVIDER_SITE_OTHER): Payer: PRIVATE HEALTH INSURANCE | Admitting: Family Medicine

## 2015-12-29 ENCOUNTER — Encounter: Payer: Self-pay | Admitting: Family Medicine

## 2015-12-29 VITALS — Temp 98.4°F | Ht 74.0 in | Wt 332.0 lb

## 2015-12-29 DIAGNOSIS — J209 Acute bronchitis, unspecified: Secondary | ICD-10-CM | POA: Diagnosis not present

## 2015-12-29 MED ORDER — HYDROCODONE-HOMATROPINE 5-1.5 MG/5ML PO SYRP: 5.0000 mL | ORAL_SOLUTION | ORAL | 0 refills | Status: DC | PRN

## 2015-12-29 MED ORDER — AZITHROMYCIN 250 MG PO TABS: ORAL_TABLET | ORAL | 0 refills | Status: DC

## 2015-12-29 NOTE — Progress Notes (Signed)
   Subjective:    Patient ID: Zachary [name], male    DOB: 14-Apr-1965, 50 y.o.   MRN: [id number]  HPI Here for the onset last night of chest tightness and a dry cough. No fever.    Review of Systems  Constitutional: Negative.   HENT: Positive for congestion and postnasal drip. Negative for ear pain, sinus pressure and sore throat.   Eyes: Negative.   Respiratory: Positive for cough and chest tightness. Negative for wheezing.   Cardiovascular: Negative.        Objective:   Physical Exam  Constitutional: He appears well-developed and well-nourished.  HENT:  Right Ear: External ear normal.  Left Ear: External ear normal.  Nose: Nose normal.  Mouth/Throat: Oropharynx is clear and moist.  Eyes: Conjunctivae are normal.  Neck: No thyromegaly present.  Pulmonary/Chest: Effort normal and breath sounds normal.  Lymphadenopathy:    He has no cervical adenopathy.          Assessment & Plan:  Bronchitis, treat with a Zpack. He is already on a Medrol dose pack for a wrist problem.  Zachary [name],STEPHEN A, MD

## 2015-12-29 NOTE — Progress Notes (Signed)
Pre visit review using our clinic review tool, if applicable. No additional management support is needed unless otherwise documented below in the visit note. 

## 2016-01-03 ENCOUNTER — Ambulatory Visit (INDEPENDENT_AMBULATORY_CARE_PROVIDER_SITE_OTHER): Payer: PRIVATE HEALTH INSURANCE | Admitting: Orthopaedic Surgery

## 2016-01-03 DIAGNOSIS — M25532 Pain in left wrist: Secondary | ICD-10-CM

## 2016-01-13 ENCOUNTER — Ambulatory Visit (INDEPENDENT_AMBULATORY_CARE_PROVIDER_SITE_OTHER): Payer: PRIVATE HEALTH INSURANCE | Admitting: Orthopaedic Surgery

## 2016-01-13 DIAGNOSIS — M25532 Pain in left wrist: Secondary | ICD-10-CM

## 2016-01-16 ENCOUNTER — Encounter (HOSPITAL_COMMUNITY): Payer: Self-pay | Admitting: Emergency Medicine

## 2016-01-16 ENCOUNTER — Ambulatory Visit (HOSPITAL_COMMUNITY)
Admission: EM | Admit: 2016-01-16 | Discharge: 2016-01-16 | Disposition: A | Discharge status: Home / Self Care | Payer: PRIVATE HEALTH INSURANCE | Attending: Family Medicine | Admitting: Family Medicine

## 2016-01-16 DIAGNOSIS — M25532 Pain in left wrist: Secondary | ICD-10-CM | POA: Diagnosis not present

## 2016-01-16 NOTE — ED Provider Notes (Signed)
[facility]    CSN: [id number] Arrival date & time: 01/16/16  1927     History   Chief Complaint Chief Complaint  Patient presents with  . Wrist Pain    HPI Zachary [name] is a 50 y.o. male.   HPI Patient comes in with left wrist pain.  Going on for several weeks.  Has been seen by PCP and prescribed oral prednisone taper, and also seen by orthopedic surgeon Dr Allie Bossier who has done left wrist injections.  MRI left wrist is pending.  Pain radial and dorsal aspect.  Wants depo injection today.  Hx diabetes.   Past Medical History:  Diagnosis Date  . Asthma   . Diabetes mellitus    sees Dr. Elvera Lennox   . Gout   . Hyperlipidemia   . Hypertension     Patient Active Problem List   Diagnosis Date Noted  . Type 2 diabetes mellitus with hyperglycemia (HCC) 10/29/2014  . Kidney stones 09/24/2014  . Scrotal varicose veins 02/24/2014  . Shingles 11/26/2013  . VIRAL URI 09/17/2009  . WRIST PAIN 11/20/2008  . GOUT 11/02/2008  . SKIN LESION 06/18/2008  . SKIN TAG 06/08/2008  . THRUSH 05/28/2008  . VIRAL GASTROENTERITIS 04/28/2008  . EPIDIDYMO-ORCHITIS 01/27/2008  . WART, VIRAL 08/16/2007  . RECTAL BLEEDING 08/16/2007  . CELLULITIS AND ABSCESS OF UPPER ARM AND FOREARM 05/08/2007  . HYPERLIPIDEMIA 05/01/2007  . Essential hypertension 05/01/2007  . ACUTE BRONCHITIS 02/20/2007  . SINUSITIS, ACUTE NOS 10/31/2006  . EUSTACHIAN TUBE DYSFUNCTION 08/29/2006  . ASTHMA 08/29/2006  . ATTENTION DEFICIT HYPERACTIVITY DISORDER, HX OF 08/29/2006  . CHICKENPOX, HX OF 08/29/2006    History reviewed. No pertinent surgical history.     Home Medications    Prior to Admission medications   Medication Sig Start Date End Date Taking? Authorizing Provider  albuterol (PROAIR HFA) 108 (90 BASE) MCG/ACT inhaler Inhale 2 puffs into the lungs every 4 (four) hours as needed for wheezing. 03/09/15   Nelwyn Salisbury, MD  amLODipine (NORVASC) 10 MG tablet Take 1 tablet (10 mg total)  by mouth daily. 09/24/15   Nelwyn Salisbury, MD  aspirin 81 MG tablet Take 81 mg by mouth daily.     Historical Provider, MD  azithromycin (ZITHROMAX Z-PAK) 250 MG tablet As directed 12/29/15   Nelwyn Salisbury, MD  carvedilol (COREG) 12.5 MG tablet Take 1 tablet (12.5 mg total) by mouth 2 (two) times daily with a meal. 06/24/15   Nelwyn Salisbury, MD  glucose blood (ONE TOUCH TEST STRIPS) test strip Use 2x a day 04/16/14   Carlus Pavlov, MD  HYDROcodone-homatropine (HYDROMET) 5-1.5 MG/5ML syrup Take 5 mLs by mouth every 4 (four) hours as needed. 12/29/15   Nelwyn Salisbury, MD  metFORMIN (GLUCOPHAGE-XR) 500 MG 24 hr tablet Take 2 tablets (1,000 mg total) by mouth daily with breakfast. 06/24/15   Nelwyn Salisbury, MD  methylPREDNISolone (MEDROL DOSEPAK) 4 MG TBPK tablet As directed 12/28/15   Nelwyn Salisbury, MD  [facility] DELICA LANCETS 33G MISC Use 2x a day 04/16/14   Carlus Pavlov, MD  rosuvastatin (CRESTOR) 40 MG tablet TAKE 1/2 TABLETS (20 MG TOTAL) BY MOUTH DAILY. 06/24/15   Nelwyn Salisbury, MD  sildenafil (REVATIO) 20 MG tablet Take 5 tablets (100 mg total) by mouth once as needed. 07/15/15   Nelwyn Salisbury, MD  valsartan-hydrochlorothiazide (DIOVAN-HCT) 320-25 MG tablet Take 1 tablet by mouth daily. 06/24/15   Nelwyn Salisbury, MD  Family History Family History  Problem Relation Age of Onset  . Breast cancer Mother     Social History Social History  Substance Use Topics  . Smoking status: Never Smoker  . Smokeless tobacco: Never Used  . Alcohol use 0.0 oz/week     Comment: once a month     Allergies   Ace inhibitors and Sulfamethoxazole-trimethoprim   Review of Systems Review of Systems  Constitutional: Negative.   Musculoskeletal: Positive for joint swelling.  Neurological: Negative.   Psychiatric/Behavioral: Negative.      Physical Exam Triage Vital Signs ED Triage Vitals  Enc Vitals Group     BP 01/16/16 2046 155/96     Pulse Rate 01/16/16 2046 97     Resp 01/16/16 2046 20     Temp  01/16/16 2046 98.9 F (37.2 C)     Temp Source 01/16/16 2046 Oral     SpO2 01/16/16 2046 97 %     Weight --      Height --      Head Circumference --      Peak Flow --      Pain Score 01/16/16 2047 8     Pain Loc --      Pain Edu? --      Excl. in GC? --    No data found.   Updated Vital Signs BP 155/96 (BP Location: Right Arm)   Pulse 97   Temp 98.9 F (37.2 C) (Oral)   Resp 20   SpO2 97%   Visual Acuity Right Eye Distance:   Left Eye Distance:   Bilateral Distance:    Right Eye Near:   Left Eye Near:    Bilateral Near:     Physical Exam  Constitutional: He is oriented to person, place, and time. He appears well-nourished. No distress.  HENT:  Head: Normocephalic and atraumatic.  Eyes: EOM are normal. Pupils are equal, round, and reactive to light.  Neck: Normal range of motion.  Pulmonary/Chest: Effort normal.  Abdominal: He exhibits no distension.  Musculoskeletal: He exhibits tenderness (over first dorsal compartment and dorsal wrist.  ).  Neurological: He is alert and oriented to person, place, and time.  Skin: Skin is warm.     UC Treatments / Results  Labs (all labs ordered are listed, but only abnormal results are displayed) Labs Reviewed - No data to display  EKG  EKG Interpretation None       Radiology No results found.  Procedures Procedures (including critical care time)  Medications Ordered in UC Medications - No data to display   Initial Impression / Assessment and Plan / UC Course  I have reviewed the triage vital signs and the nursing notes.  Pertinent labs & imaging results that were available during my care of the patient were reviewed by me and considered in my medical decision making (see chart for details).  Clinical Course      Final Clinical Impressions(s) / UC Diagnoses   Final diagnoses:  Left wrist pain    New Prescriptions New Prescriptions   No medications on file  advised patient that I don't  recommend any further injections or prednisone tapers.  Given a thumb spica splint and recommend f/u with Dr Magnus IvanBlackman.  All questions answered.    Naida SleightJames M Jeraldine Primeau, PA-C 01/16/16 2136

## 2016-01-16 NOTE — ED Triage Notes (Signed)
[facility] a [facility] recurrent [facility] wrist pain. The patient stated that he injured his left wrist lifting weights 3 weeks ago. The patient reported PCP visits, ortho evaluations and treatments to include steroid injections, oral steroids, negative xrays, and physical therapy. The patient further reported that his wrist became more swollen and painful today at work however he could no identify any specific injury. The patient stated that he has been wearing a wrist brace, taking Mobic and using Voltaren gel. The patient advised that his Orthopedic physician recommended an MRI which has not yet occurred. The patient requested additional oral steroids, an injection of steroids and a Toradol Injection.

## 2016-01-17 ENCOUNTER — Telehealth (INDEPENDENT_AMBULATORY_CARE_PROVIDER_SITE_OTHER): Payer: Self-pay | Admitting: Orthopaedic Surgery

## 2016-01-18 NOTE — Telephone Encounter (Signed)
Can you help me with this?

## 2016-01-19 ENCOUNTER — Other Ambulatory Visit (INDEPENDENT_AMBULATORY_CARE_PROVIDER_SITE_OTHER): Payer: Self-pay | Admitting: Orthopaedic Surgery

## 2016-01-19 ENCOUNTER — Encounter (INDEPENDENT_AMBULATORY_CARE_PROVIDER_SITE_OTHER): Payer: Self-pay | Admitting: Orthopaedic Surgery

## 2016-01-19 DIAGNOSIS — M25532 Pain in left wrist: Secondary | ICD-10-CM

## 2016-01-19 NOTE — Telephone Encounter (Signed)
[facility] to [facility] did not have order and does not do Arthrograms, order in [facility] wq, pending appt

## 2016-01-20 ENCOUNTER — Other Ambulatory Visit (INDEPENDENT_AMBULATORY_CARE_PROVIDER_SITE_OTHER): Payer: Self-pay

## 2016-01-20 ENCOUNTER — Telehealth (INDEPENDENT_AMBULATORY_CARE_PROVIDER_SITE_OTHER): Payer: Self-pay | Admitting: *Deleted

## 2016-01-20 ENCOUNTER — Other Ambulatory Visit (INDEPENDENT_AMBULATORY_CARE_PROVIDER_SITE_OTHER): Payer: Self-pay | Admitting: Orthopaedic Surgery

## 2016-01-20 DIAGNOSIS — M25532 Pain in left wrist: Secondary | ICD-10-CM

## 2016-01-20 NOTE — Telephone Encounter (Signed)
Please advise 

## 2016-01-20 NOTE — Telephone Encounter (Signed)
[facility] called. They have received the order for the wrist MRI but need to add order for arthogram code is: [id number]. Call back number is [phone].

## 2016-01-20 NOTE — Telephone Encounter (Signed)
Order place arthrogram to [facility] as requested

## 2016-01-21 ENCOUNTER — Telehealth (INDEPENDENT_AMBULATORY_CARE_PROVIDER_SITE_OTHER): Payer: Self-pay

## 2016-01-21 NOTE — Telephone Encounter (Signed)
Please call pt asap about the schedule of his MRI.  He called and said he has been waiting for a long time to get this scheduled and is having problems with being out of work.  He did go to Urgent Care regarding the problem and was told noting can be done till the MRI is done.

## 2016-01-21 NOTE — Telephone Encounter (Signed)
[facility] about status of order MRI and stated they have tried calling pt numerous times to schedule the appt but has not received call back from pt, I called pt back and advised his that [facility] has left messages for hiim to call and he stated that he hasnt checked his voice messages, I gave pt number to call [facility] to scheldule appt.

## 2016-02-03 ENCOUNTER — Ambulatory Visit
Admission: RE | Admit: 2016-02-03 | Discharge: 2016-02-03 | Disposition: A | Discharge status: Home / Self Care | Payer: PRIVATE HEALTH INSURANCE | Source: Ambulatory Visit | Attending: Orthopaedic Surgery | Admitting: Orthopaedic Surgery

## 2016-02-03 DIAGNOSIS — M25532 Pain in left wrist: Secondary | ICD-10-CM

## 2016-02-03 MED ORDER — IOPAMIDOL (ISOVUE-M 200) INJECTION 41%
4.0000 mL | Freq: Once | INTRAMUSCULAR | Status: AC
  Administered 2016-02-03: 4 mL via INTRA_ARTICULAR

## 2016-02-10 ENCOUNTER — Ambulatory Visit (INDEPENDENT_AMBULATORY_CARE_PROVIDER_SITE_OTHER): Payer: PRIVATE HEALTH INSURANCE | Admitting: Orthopaedic Surgery

## 2016-03-06 ENCOUNTER — Ambulatory Visit (INDEPENDENT_AMBULATORY_CARE_PROVIDER_SITE_OTHER): Payer: PRIVATE HEALTH INSURANCE | Admitting: Orthopaedic Surgery

## 2016-03-06 DIAGNOSIS — M25532 Pain in left wrist: Secondary | ICD-10-CM | POA: Diagnosis not present

## 2016-03-06 NOTE — Progress Notes (Signed)
Orvilla Fusommy is well-known to me. Of note in for many years now having grown up together. He's had chronic problems with his left wrist. We've injected it on multiple occasions and it does get better from time to time but is slowly worsening with time. We finally decided to get an MRI to better evaluate his left wrist, with a good treatment plan.  He hurts on the ulnar aspect and dorsal aspect of his left wrist. He hurts quite a bit of dorsiflexion of the wrist. It seems to be globally tender and slightly swollen but no evidence infection.  MRI of his wrist does show synovitis throughout the wrist. 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  At this point I feel an arthroscopic intervention of the left wrist is warranted. This visit include a partial synovectomy as well as a debridement of the TFCC. I gave him his MRI report and talked to him in detail with the surgery involves. He was consider this sometime in the new year. He has our surgery scheduler's card and he will give us a call if he decides to have this surgery done.

## 2016-04-05 ENCOUNTER — Ambulatory Visit (INDEPENDENT_AMBULATORY_CARE_PROVIDER_SITE_OTHER): Payer: Managed Care, Other (non HMO) | Admitting: Family Medicine

## 2016-04-05 ENCOUNTER — Encounter: Payer: Self-pay | Admitting: Family Medicine

## 2016-04-05 VITALS — HR 87 | Temp 98.5°F | Ht 74.0 in | Wt 338.0 lb

## 2016-04-05 DIAGNOSIS — J019 Acute sinusitis, unspecified: Secondary | ICD-10-CM

## 2016-04-05 MED ORDER — AZITHROMYCIN 250 MG PO TABS: ORAL_TABLET | ORAL | 0 refills | Status: DC

## 2016-04-05 NOTE — Progress Notes (Signed)
Pre visit review using our clinic review tool, if applicable. No additional management support is needed unless otherwise documented below in the visit note. 

## 2016-04-05 NOTE — Progress Notes (Signed)
   Subjective:    Patient ID: Zachary [name], male    DOB: 06-27-1965, 51 y.o.   MRN: [id number]  HPI Here for one week of sinus pressure, right ear pain, and a dry cough. He has started a Medrol dose pack he had at home. Using Mucinex.    Review of Systems  Constitutional: Negative.   HENT: Positive for congestion, ear pain, postnasal drip, sinus pain and sinus pressure. Negative for sore throat.   Eyes: Negative.   Respiratory: Positive for cough.        Objective:   Physical Exam  Constitutional: He appears well-developed and well-nourished.  HENT:  Right Ear: External ear normal.  Left Ear: External ear normal.  Nose: Nose normal.  Mouth/Throat: Oropharynx is clear and moist.  Eyes: Conjunctivae are normal.  Neck: No thyromegaly present.  Pulmonary/Chest: Effort normal and breath sounds normal.  Lymphadenopathy:    He has no cervical adenopathy.          Assessment & Plan:  Sinusitis, add a Zpack.  Gershon CraneStephen Sumayyah Custodio, MD

## 2016-04-11 ENCOUNTER — Ambulatory Visit (INDEPENDENT_AMBULATORY_CARE_PROVIDER_SITE_OTHER): Payer: PRIVATE HEALTH INSURANCE | Admitting: Orthopaedic Surgery

## 2016-05-02 ENCOUNTER — Ambulatory Visit (INDEPENDENT_AMBULATORY_CARE_PROVIDER_SITE_OTHER): Payer: Managed Care, Other (non HMO) | Admitting: Family Medicine

## 2016-05-02 ENCOUNTER — Encounter: Payer: Self-pay | Admitting: Family Medicine

## 2016-05-02 VITALS — HR 100 | Temp 102.7°F | Ht 74.0 in | Wt 340.0 lb

## 2016-05-02 DIAGNOSIS — J209 Acute bronchitis, unspecified: Secondary | ICD-10-CM

## 2016-05-02 LAB — POCT INFLUENZA A/B
INFLUENZA A, POC: NEGATIVE
INFLUENZA B, POC: NEGATIVE

## 2016-05-02 MED ORDER — METHYLPREDNISOLONE 4 MG PO TBPK: ORAL_TABLET | ORAL | 0 refills | Status: DC

## 2016-05-02 MED ORDER — HYDROCODONE-HOMATROPINE 5-1.5 MG/5ML PO SYRP: 5.0000 mL | ORAL_SOLUTION | ORAL | 0 refills | Status: DC | PRN

## 2016-05-02 MED ORDER — OSELTAMIVIR PHOSPHATE 75 MG PO CAPS: 75.0000 mg | ORAL_CAPSULE | Freq: Two times a day (BID) | ORAL | 0 refills | Status: DC

## 2016-05-02 MED ORDER — AZITHROMYCIN 250 MG PO TABS: ORAL_TABLET | ORAL | 0 refills | Status: DC

## 2016-05-02 MED ORDER — IPRATROPIUM-ALBUTEROL 0.5-2.5 (3) MG/3ML IN SOLN
3.0000 mL | Freq: Four times a day (QID) | RESPIRATORY_TRACT | Status: DC
Start: 2016-05-02 — End: 2016-05-02

## 2016-05-02 MED ORDER — IPRATROPIUM-ALBUTEROL 0.5-2.5 (3) MG/3ML IN SOLN
3.0000 mL | Freq: Once | RESPIRATORY_TRACT | Status: AC
  Administered 2016-05-02: 3 mL via RESPIRATORY_TRACT

## 2016-05-02 NOTE — Addendum Note (Signed)
Addended by: Aniceto BossNIMMONS, SYLVIA A on: 05/02/2016 10:55 AM   Modules accepted: Orders

## 2016-05-02 NOTE — Progress Notes (Signed)
Pre visit review using our clinic review tool, if applicable. No additional management support is needed unless otherwise documented below in the visit note. 

## 2016-05-02 NOTE — Progress Notes (Addendum)
   Subjective:    Patient ID: Zachary [name], male    DOB: 12/13/1965, 51 y.o.   MRN: [id number]  HPI Here with his wife for 36 hours of fever to 102 degrees, body aches, headaches, SOB, and hard dry coughing. No NVD. He has been using Xopenex in his nebulizer and taking Ibuprofen. Drinking fluids. No chest pain.    Review of Systems  Constitutional: Positive for fever.  HENT: Positive for congestion. Negative for postnasal drip, sinus pain, sinus pressure and sore throat.   Eyes: Negative.   Respiratory: Positive for cough, chest tightness, shortness of breath and wheezing.   Cardiovascular: Negative.   Gastrointestinal: Negative.        Objective:   Physical Exam  Constitutional:  Appears ill, audible wheezing  HENT:  Right Ear: External ear normal.  Left Ear: External ear normal.  Nose: Nose normal.  Mouth/Throat: Oropharynx is clear and moist.  Eyes: Conjunctivae are normal.  Neck: Neck supple. No thyromegaly present.  Cardiovascular: Normal rate, regular rhythm, normal heart sounds and intact distal pulses.   Pulmonary/Chest: He has no rales.  Diffuse rhonchi and wheezes, reduced air flow   Abdominal: Soft. Bowel sounds are normal. He exhibits no distension and no mass. There is no tenderness. There is no rebound and no guarding.  Lymphadenopathy:    He has no cervical adenopathy.          Assessment & Plan:  Probable influenza. Treat with Tamiflu for 5 days. This is complicated by a bronchitis, which we will treat with a Zpack. Use the nebulizer as needed. Add a Medrol dose pack. Written out of work today through 05-07-16. Gershon CraneStephen Dagoberto Nealy, MD

## 2016-06-12 ENCOUNTER — Ambulatory Visit (INDEPENDENT_AMBULATORY_CARE_PROVIDER_SITE_OTHER): Payer: Managed Care, Other (non HMO) | Admitting: Family Medicine

## 2016-06-12 ENCOUNTER — Encounter: Payer: Self-pay | Admitting: Family Medicine

## 2016-06-12 VITALS — Temp 98.7°F | Ht 74.0 in | Wt 338.0 lb

## 2016-06-12 DIAGNOSIS — M436 Torticollis: Secondary | ICD-10-CM | POA: Diagnosis not present

## 2016-06-12 MED ORDER — CYCLOBENZAPRINE HCL 10 MG PO TABS: 10.0000 mg | ORAL_TABLET | Freq: Three times a day (TID) | ORAL | 2 refills | Status: DC | PRN

## 2016-06-12 NOTE — Progress Notes (Signed)
Pre visit review using our clinic review tool, if applicable. No additional management support is needed unless otherwise documented below in the visit note. 

## 2016-06-12 NOTE — Progress Notes (Signed)
   Subjective:    Patient ID: Rollene RotundaJames T Tootle, male    DOB: Aug 10, 1965, 51 y.o.   MRN: [id number]  HPI Here for 2 days of stiffness and pain in the left neck after waking up this way. No recent trauma. Using heat and Ibuprofen.    Review of Systems  Constitutional: Negative.   Respiratory: Negative.   Cardiovascular: Negative.   Musculoskeletal: Positive for neck pain and neck stiffness.       Objective:   Physical Exam  Constitutional: He appears well-developed and well-nourished.  Musculoskeletal:  Tender in the left neck and left trapezius. ROM is limited by pain.           Assessment & Plan:  Torticollis. Add Flexeril prn.  Gershon CraneStephen Savina Olshefski, MD

## 2016-06-12 NOTE — Patient Instructions (Signed)
WE NOW OFFER   [name] FAST TRACK!!!  SAME DAY Appointments for ACUTE CARE  Such as: Sprains, Injuries, cuts, abrasions, rashes, muscle pain, joint pain, back pain Colds, flu, sore throats, headache, allergies, cough, fever  Ear pain, sinus and eye infections Abdominal pain, nausea, vomiting, diarrhea, upset stomach Animal/insect bites  3 Easy Ways to Schedule: Walk-In Scheduling Call in scheduling Mychart Sign-up: [email or web address]         

## 2016-07-01 ENCOUNTER — Other Ambulatory Visit: Payer: Self-pay | Admitting: Family Medicine

## 2016-07-06 ENCOUNTER — Other Ambulatory Visit: Payer: Self-pay | Admitting: Family Medicine

## 2016-07-10 ENCOUNTER — Other Ambulatory Visit: Payer: Self-pay | Admitting: Family Medicine

## 2016-07-13 ENCOUNTER — Encounter: Payer: Self-pay | Admitting: Family Medicine

## 2016-07-14 ENCOUNTER — Other Ambulatory Visit: Payer: Self-pay

## 2016-07-14 MED ORDER — METFORMIN HCL ER 500 MG PO TB24: 1000.0000 mg | ORAL_TABLET | Freq: Every day | ORAL | 1 refills | Status: DC

## 2016-09-04 ENCOUNTER — Encounter: Payer: Self-pay | Admitting: Family Medicine

## 2016-09-04 ENCOUNTER — Ambulatory Visit (INDEPENDENT_AMBULATORY_CARE_PROVIDER_SITE_OTHER): Payer: Managed Care, Other (non HMO) | Admitting: Family Medicine

## 2016-09-04 VITALS — BP 138/88 | Temp 98.8°F | Ht 72.0 in | Wt 328.0 lb

## 2016-09-04 DIAGNOSIS — Z0000 Encounter for general adult medical examination without abnormal findings: Secondary | ICD-10-CM

## 2016-09-04 DIAGNOSIS — E1165 Type 2 diabetes mellitus with hyperglycemia: Secondary | ICD-10-CM

## 2016-09-04 LAB — BASIC METABOLIC PANEL
BUN: 16 mg/dL (ref 6–23)
CHLORIDE: 103 meq/L (ref 96–112)
CO2: 25 mEq/L (ref 19–32)
CREATININE: 1.24 mg/dL (ref 0.40–1.50)
Calcium: 9.4 mg/dL (ref 8.4–10.5)
GFR: 65.84 mL/min (ref >60.00)
Glucose, Bld: 231 mg/dL — ABNORMAL HIGH (ref 70–99)
POTASSIUM: 3.8 meq/L (ref 3.5–5.1)
Sodium: 138 mEq/L (ref 135–145)

## 2016-09-04 LAB — CBC WITH DIFFERENTIAL/PLATELET
BASOS ABS: 0 10*3/uL (ref 0.0–0.1)
Basophils Relative: 0.6 % (ref 0.0–3.0)
EOS PCT: 2.3 % (ref 0.0–5.0)
Eosinophils Absolute: 0.1 10*3/uL (ref 0.0–0.7)
HEMATOCRIT: 48.7 % (ref 39.0–52.0)
HEMOGLOBIN: 16.9 g/dL (ref 13.0–17.0)
LYMPHS ABS: 1.9 10*3/uL (ref 0.7–4.0)
LYMPHS PCT: 37.6 % (ref 12.0–46.0)
MCHC: 34.7 g/dL (ref 30.0–36.0)
MCV: 88.3 fl (ref 78.0–100.0)
MONOS PCT: 7.5 % (ref 3.0–12.0)
Monocytes Absolute: 0.4 10*3/uL (ref 0.1–1.0)
Neutro Abs: 2.7 10*3/uL (ref 1.4–7.7)
Neutrophils Relative %: 52 % (ref 43.0–77.0)
Platelets: 205 10*3/uL (ref 150.0–400.0)
RBC: 5.51 Mil/uL (ref 4.22–5.81)
RDW: 13.4 % (ref 11.5–15.5)
WBC: 5.1 10*3/uL (ref 4.0–10.5)

## 2016-09-04 LAB — HEPATIC FUNCTION PANEL
ALT: 88 U/L — AB (ref 0–53)
AST: 62 U/L — ABNORMAL HIGH (ref 0–37)
Albumin: 4.4 g/dL (ref 3.5–5.2)
Alkaline Phosphatase: 59 U/L (ref 39–117)
BILIRUBIN DIRECT: 0.1 mg/dL (ref 0.0–0.3)
BILIRUBIN TOTAL: 0.8 mg/dL (ref 0.2–1.2)
Total Protein: 7.1 g/dL (ref 6.0–8.3)

## 2016-09-04 LAB — LIPID PANEL
CHOL/HDL RATIO: 4
Cholesterol: 167 mg/dL (ref 0–200)
HDL: 39.5 mg/dL (ref >39.00)
LDL Cholesterol: 107 mg/dL — ABNORMAL HIGH (ref 0–99)
NONHDL: 127.28
Triglycerides: 100 mg/dL (ref 0.0–149.0)
VLDL: 20 mg/dL (ref 0.0–40.0)

## 2016-09-04 LAB — POC URINALSYSI DIPSTICK (AUTOMATED)
Bilirubin, UA: NEGATIVE
Blood, UA: NEGATIVE
Glucose, UA: NEGATIVE
KETONES UA: NEGATIVE
LEUKOCYTES UA: NEGATIVE
NITRITE UA: NEGATIVE
Spec Grav, UA: >=1.03 — AB (ref 1.010–1.025)
Urobilinogen, UA: 0.2 E.U./dL
pH, UA: 6 (ref 5.0–8.0)

## 2016-09-04 LAB — HEMOGLOBIN A1C: Hgb A1c MFr Bld: 10.5 % — ABNORMAL HIGH (ref 4.6–6.5)

## 2016-09-04 LAB — TSH: TSH: 2.35 u[IU]/mL (ref 0.35–4.50)

## 2016-09-04 MED ORDER — AMLODIPINE BESYLATE 10 MG PO TABS: 10.0000 mg | ORAL_TABLET | Freq: Every day | ORAL | 3 refills | Status: DC

## 2016-09-04 MED ORDER — SILDENAFIL CITRATE 20 MG PO TABS: 100.0000 mg | ORAL_TABLET | Freq: Once | ORAL | 11 refills | Status: DC | PRN

## 2016-09-04 MED ORDER — ROSUVASTATIN CALCIUM 40 MG PO TABS: ORAL_TABLET | ORAL | 3 refills | Status: DC

## 2016-09-04 MED ORDER — METFORMIN HCL ER 500 MG PO TB24: 1000.0000 mg | ORAL_TABLET | Freq: Every day | ORAL | 3 refills | Status: DC

## 2016-09-04 NOTE — Progress Notes (Signed)
   Subjective:    Patient ID: Zachary [name], male    DOB: Dec 15, 1965, 51 y.o.   MRN: [id number]  HPI 51 yr old male for a well exam. He feels well.    Review of Systems  Constitutional: Negative.   HENT: Negative.   Eyes: Negative.   Respiratory: Negative.   Cardiovascular: Negative.   Gastrointestinal: Negative.   Genitourinary: Negative.   Musculoskeletal: Negative.   Skin: Negative.   Neurological: Negative.   Psychiatric/Behavioral: Negative.        Objective:   Physical Exam  Constitutional: He is oriented to person, place, and time. He appears well-developed and well-nourished. No distress.  HENT:  Head: Normocephalic and atraumatic.  Right Ear: External ear normal.  Left Ear: External ear normal.  Nose: Nose normal.  Mouth/Throat: Oropharynx is clear and moist. No oropharyngeal exudate.  Eyes: Conjunctivae and EOM are normal. Pupils are equal, round, and reactive to light. Right eye exhibits no discharge. Left eye exhibits no discharge. No scleral icterus.  Neck: Neck supple. No JVD present. No tracheal deviation present. No thyromegaly present.  Cardiovascular: Normal rate, regular rhythm, normal heart sounds and intact distal pulses.  Exam reveals no gallop and no friction rub.   No murmur heard. Pulmonary/Chest: Effort normal and breath sounds normal. No respiratory distress. He has no wheezes. He has no rales. He exhibits no tenderness.  Abdominal: Soft. Bowel sounds are normal. He exhibits no distension and no mass. There is no tenderness. There is no rebound and no guarding.  Genitourinary: Penis normal. No penile tenderness.  Musculoskeletal: Normal range of motion. He exhibits no edema or tenderness.  Lymphadenopathy:    He has no cervical adenopathy.  Neurological: He is alert and oriented to person, place, and time. He has normal reflexes. No cranial nerve deficit. He exhibits normal muscle tone. Coordination normal.  Skin: Skin is warm and dry. No rash  noted. He is not diaphoretic. No erythema. No pallor.  Psychiatric: He has a normal mood and affect. His behavior is normal. Judgment and thought content normal.          Assessment & Plan:  Well exam. We discussed diet and exercise. Get fasting labs. Gershon CraneStephen Agusta Hackenberg, MD

## 2016-09-04 NOTE — Patient Instructions (Signed)
WE NOW OFFER   [facility] FAST TRACK!!!  SAME DAY Appointments for ACUTE CARE  Such as: Sprains, Injuries, cuts, abrasions, rashes, muscle pain, joint pain, back pain Colds, flu, sore throats, headache, allergies, cough, fever  Ear pain, sinus and eye infections Abdominal pain, nausea, vomiting, diarrhea, upset stomach Animal/insect bites  3 Easy Ways to Schedule: Walk-In Scheduling Call in scheduling Mychart Sign-up: [email or web address]         

## 2016-09-05 NOTE — Telephone Encounter (Signed)
Please add a urine microalbumen to his lab orders (use E11.65)

## 2016-09-06 ENCOUNTER — Encounter: Payer: Self-pay | Admitting: Family Medicine

## 2016-09-12 ENCOUNTER — Telehealth: Payer: Self-pay | Admitting: Internal Medicine

## 2016-09-12 NOTE — Telephone Encounter (Signed)
Patient is calling to schedule an appt with dr Elvera Lennoxgherghe. Last seen 2016. See lab results from dr fry. He is willing to see another provider in the meantime of waiting for an appointment. He states that he needs to be seen more urgently than September. Please review and advise patient.

## 2016-09-13 ENCOUNTER — Telehealth: Payer: Self-pay | Admitting: Family Medicine

## 2016-09-13 NOTE — Telephone Encounter (Signed)
Please call the Endocrine office to see why one of her partners could not see the patient in her absence

## 2016-09-13 NOTE — Telephone Encounter (Signed)
Pt states he was advised to see his endocrinologist and Dr Elvera LennoxGherghe out until Sept.  Pt would like Dr Clent RidgesFry to refer him to someone else in that office, because they advised they would need "permission" for him to see another provider. Thank you.

## 2016-09-13 NOTE — Telephone Encounter (Signed)
Sent pt a my chart message with below information.  

## 2016-09-13 NOTE — Telephone Encounter (Signed)
Pt states he was advised to see his endocrinologist and Dr Elvera LennoxGherghe out until Sept.  Pt would like Dr Clent RidgesFry to refer him to someone else in that office, because they advised they would need "permission" for him to see another provider.  Pt was not happy with that answer. Thank you.

## 2016-09-13 NOTE — Telephone Encounter (Signed)
Sylvia from [facility] called to get patient in as soon as possible due to numbers. See Dr. Clent RidgesFry note that was sent and call patient to advise.

## 2016-09-13 NOTE — Telephone Encounter (Signed)
We called Dr. Charlean SanfilippoGherghe's office this afternoon and requested for you to be seen as soon as possible. Dr. Clent RidgesFry does not want you to wait 2-3 months. They will send this message to provider and we hope that she can get you in the office sooner to be seen.

## 2016-09-14 ENCOUNTER — Encounter: Payer: Self-pay | Admitting: Family Medicine

## 2016-09-14 ENCOUNTER — Other Ambulatory Visit: Payer: Self-pay | Admitting: Family Medicine

## 2016-09-14 MED ORDER — GLUCOSE BLOOD VI STRP: ORAL_STRIP | 0 refills | Status: DC

## 2016-09-14 MED ORDER — ONETOUCH DELICA LANCETS 33G MISC: 0 refills | Status: DC

## 2016-09-15 NOTE — Telephone Encounter (Signed)
09/20/16, 3:45 PM

## 2016-09-15 NOTE — Telephone Encounter (Signed)
Can you please look into this. Thanks 

## 2016-09-15 NOTE — Telephone Encounter (Signed)
LM for the pt to call back to let him know of the appt for 6/27. Asked him to please call back to confirm he can come

## 2016-09-19 LAB — HM DIABETES EYE EXAM

## 2016-09-20 ENCOUNTER — Encounter: Payer: Self-pay | Admitting: Endocrinology

## 2016-09-20 ENCOUNTER — Ambulatory Visit (INDEPENDENT_AMBULATORY_CARE_PROVIDER_SITE_OTHER): Payer: Managed Care, Other (non HMO) | Admitting: Endocrinology

## 2016-09-20 VITALS — BP 132/88 | HR 99 | Wt 320.0 lb

## 2016-09-20 DIAGNOSIS — E1165 Type 2 diabetes mellitus with hyperglycemia: Secondary | ICD-10-CM

## 2016-09-20 MED ORDER — GLUCOSE BLOOD VI STRP: 1.0000 | ORAL_STRIP | Freq: Four times a day (QID) | 3 refills | Status: DC

## 2016-09-20 NOTE — Patient Instructions (Addendum)
check your blood sugar 4 times a day: before the 3 meals, and at bedtime.  also check if you have symptoms of your blood sugar being too high or too low.  please keep a record of the readings and bring it to your next appointment here (or you can bring the meter itself).  You can write it on any piece of paper.  please call us sooner if your blood sugar goes below 70, or if you have a lot of readings over 200.  A blood test is requested for you today.  We'll let you know about the results.  Based on the results, you may need to add-back the tradjenta, or increase the metformin, or both.   Please come back for a follow-up appointment to see Dr Elvera LennoxGherghe in approx 3 months.

## 2016-09-20 NOTE — Progress Notes (Signed)
Subjective:    Patient ID: Zachary [name], male    DOB: 23-Oct-1965, 51 y.o.   MRN: [id number]  HPI Pt returns for f/u of diabetes mellitus: DM type: 2 Dx'ed: 2013 Complications: renal insuff Therapy: metformin DKA: never Severe hypoglycemia: never Pancreatitis: never Pancreatic imaging: never Other: she has never been on insulin; he is RN--varying shifts.   Interval history: He brings a record of his cbg's which I have reviewed today.  It varies from 106-200.  He says the recent high a1c was due to prednisone (he took in March, 2018).  He stopped tradjenta, due to cost.   Past Medical History:  Diagnosis Date  . Asthma   . Diabetes mellitus    sees Dr. Elvera [name]   . Gout   . Hyperlipidemia   . Hypertension     No past surgical history on file.  Social History   Social History  . Marital status: Married    Spouse name: N/A  . Number of children: N/A  . Years of education: N/A   Occupational History  . Not on file.   Social History Main Topics  . Smoking status: Never Smoker  . Smokeless tobacco: Never Used  . Alcohol use 0.0 oz/week     Comment: once a month  . Drug use: No  . Sexual activity: Not on file   Other Topics Concern  . 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 albuterol (PROAIR HFA) 108 (90 BASE) MCG/ACT inhaler Inhale 2 puffs into the lungs every 4 (four) hours as needed for wheezing. 1 Inhaler 5  . amLODipine (NORVASC) 10 MG tablet Take 1 tablet (10 mg total) by mouth daily. 90 tablet 3  . aspirin 81 MG tablet Take 81 mg by mouth daily.     . carvedilol (COREG) 12.5 MG tablet TAKE 1 TABLET (12.5 MG TOTAL) BY MOUTH 2 (TWO) TIMES DAILY WITH A MEAL. 60 tablet 6  . metFORMIN (GLUCOPHAGE-XR) 500 MG 24 hr tablet Take 2 tablets (1,000 mg total) by mouth daily with  breakfast. 180 tablet 3  . rosuvastatin (CRESTOR) 40 MG tablet TAKE 1/2 TABLETS (20 MG TOTAL) BY MOUTH DAILY. 90 tablet 3  . sildenafil (REVATIO) 20 MG tablet Take 5 tablets (100 mg total) by mouth once as needed. 50 tablet 11  . valsartan-hydrochlorothiazide (DIOVAN-HCT) 320-25 MG tablet TAKE 1 TABLET BY MOUTH DAILY. 30 tablet 10   No current facility-administered medications on file prior to visit.     Allergies  Allergen Reactions  . Ace Inhibitors     REACTION: cough  . Sulfamethoxazole-Trimethoprim     REACTION: unspecified    Family History  Problem Relation Age of Onset  . Breast cancer Mother     BP 132/88 (BP Location: Left Arm, Patient Position: Sitting)   Pulse 99   Wt (!) 320 lb (145.2 kg)   SpO2 95%   BMI 43.40 kg/m    Review of Systems He has recently lost 16 lbs, due to his efforts.      Objective:   Physical Exam VITAL SIGNS:  See vs page GENERAL: no distress Pulses: dorsalis pedis intact bilat.   MSK: no deformity of the feet CV: 1+ bilat leg edema Skin:  no ulcer on  the feet.  normal color and temp on the feet.  Neuro: sensation is intact to touch on the feet.    Lab Results  Component Value Date   HGBA1C 10.5 (H) 09/04/2016       Assessment & Plan:  Type 2 DM: worse.  cbg's are much worse than a1c.  Check fructosamine.  weight loss: prob due to severe hyperglycemia. Plan will be to follow.  Edema: this limits rx options.   Patient Instructions  check your blood sugar 4 times a day: before the 3 meals, and at bedtime.  also check if you have symptoms of your blood sugar being too high or too low.  please keep a record of the readings and bring it to your next appointment here (or you can bring the meter itself).  You can write it on any piece of paper.  please call us sooner if your blood sugar goes below 70, or if you have a lot of readings over 200.  A blood test is requested for you today.  We'll let you know about the results.  Based on  the results, you may need to add-back the tradjenta, or increase the metformin, or both.   Please come back for a follow-up appointment to see Dr Zachary [name] in approx 3 months.

## 2016-09-22 LAB — FRUCTOSAMINE: FRUCTOSAMINE: 324 umol/L — AB (ref 190–270)

## 2016-09-23 ENCOUNTER — Encounter: Payer: Self-pay | Admitting: Endocrinology

## 2016-09-25 ENCOUNTER — Other Ambulatory Visit: Payer: Self-pay | Admitting: Endocrinology

## 2016-09-25 MED ORDER — LINAGLIPTIN 5 MG PO TABS: 5.0000 mg | ORAL_TABLET | Freq: Every day | ORAL | 2 refills | Status: DC

## 2016-09-28 ENCOUNTER — Encounter: Payer: Self-pay | Admitting: Endocrinology

## 2016-09-29 ENCOUNTER — Other Ambulatory Visit: Payer: Self-pay | Admitting: Endocrinology

## 2016-09-29 MED ORDER — SITAGLIPTIN PHOSPHATE 100 MG PO TABS: 100.0000 mg | ORAL_TABLET | Freq: Every day | ORAL | 11 refills | Status: DC

## 2016-10-05 ENCOUNTER — Encounter (INDEPENDENT_AMBULATORY_CARE_PROVIDER_SITE_OTHER): Payer: Self-pay | Admitting: Physician Assistant

## 2016-10-05 ENCOUNTER — Ambulatory Visit (INDEPENDENT_AMBULATORY_CARE_PROVIDER_SITE_OTHER): Payer: Managed Care, Other (non HMO) | Admitting: Physician Assistant

## 2016-10-05 DIAGNOSIS — S6982XD Other specified injuries of left wrist, hand and finger(s), subsequent encounter: Secondary | ICD-10-CM | POA: Insufficient documentation

## 2016-10-05 MED ORDER — TRAMADOL HCL 50 MG PO TABS: 50.0000 mg | ORAL_TABLET | Freq: Four times a day (QID) | ORAL | 0 refills | Status: DC | PRN

## 2016-10-05 NOTE — Progress Notes (Signed)
Office Visit Note   Patient: Rollene RotundaJames T Eisemann           Date of Birth: Jul 15, 1965           MRN: [id number] Visit Date: 10/05/2016              Requested by: Nelwyn SalisburyFry, Stephen A, MD [address] [place], [place] [id number] PCP: Nelwyn SalisburyFry, Stephen A, MD   Assessment & Plan: Visit Diagnoses:  1. TFCC (triangular fibrocartilage complex) injury, left, subsequent encounter     Plan: Due to patient's chronic wrist pain and the fact that is affecting his life particularly his ability warmish job along with the findings on MRI which shows a central TFCC tear and synovitis throughout the wrist. Would recommend an interventional arthroscopic partial synovectomy as well as debridement  of the TFCC. Fayrene FearingJames would like to proceed with this in the near future. Questions were encouraged and answered by Dr. Magnus IvanBlackman itself. We'll see him in 1 week postop.  Follow-Up Instructions: Return in about 1 week (around 10/12/2016).   Orders:  No orders of the defined types were placed in this encounter.  Meds ordered this encounter  Medications  . traMADol (ULTRAM) 50 MG tablet    Sig: Take 1 tablet (50 mg total) by mouth every 6 (six) hours as needed.    Dispense:  40 tablet    Refill:  0      Procedures: No procedures performed   Clinical Data: No additional findings.   Subjective: Chief Complaint  Patient presents with  . Left Wrist - Pain    HPI Mr. Magel's 51 year old male with chronic left wrist pain. He has a known TFCC tear and extensive synovitis throughout the wrist. 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 This tried [place] tramadol ice elevation despite his conservative treatments continues to have pain and swelling in the wrist. Review of Systems No chest pain , SOB , fevers or chills.   Objective: Vital Signs: There were no vitals taken for this visit.  Physical Exam  Constitutional: He is oriented to person, place, and time. He appears well-developed and  well-nourished. No distress.  Pulmonary/Chest: Effort normal.  Neurological: He is alert and oriented to person, place, and time.  Skin: He is not diaphoretic.  Psychiatric: He has a normal mood and affect. His behavior is normal.    Ortho Exam Left wrist slight edema compared to the right wrist. No ecchymosis erythema or abnormal warmth. He has tenderness over the TFCC region. Extreme radial deviation causes pain lateral aspect of the left wrist. Radial pulse 2+. Specialty Comments:  No specialty comments available.  Imaging: No results found.   PMFS History: Patient Active Problem List   Diagnosis Date Noted  . TFCC (triangular fibrocartilage complex) injury, left, subsequent encounter 10/05/2016  . Type 2 diabetes mellitus with hyperglycemia (HCC) 10/29/2014  . Kidney stones 09/24/2014  . Scrotal varicose veins 02/24/2014  . Shingles 11/26/2013  . VIRAL URI 09/17/2009  . WRIST PAIN 11/20/2008  . GOUT 11/02/2008  . SKIN LESION 06/18/2008  . SKIN TAG 06/08/2008  . THRUSH 05/28/2008  . VIRAL GASTROENTERITIS 04/28/2008  . EPIDIDYMO-ORCHITIS 01/27/2008  . WART, VIRAL 08/16/2007  . RECTAL BLEEDING 08/16/2007  . CELLULITIS AND ABSCESS OF UPPER ARM AND FOREARM 05/08/2007  . HYPERLIPIDEMIA 05/01/2007  . Essential hypertension 05/01/2007  . ACUTE BRONCHITIS 02/20/2007  . SINUSITIS, ACUTE NOS 10/31/2006  . EUSTACHIAN TUBE DYSFUNCTION 08/29/2006  . ASTHMA 08/29/2006  .  ATTENTION DEFICIT HYPERACTIVITY DISORDER, HX OF 08/29/2006  . CHICKENPOX, HX OF 08/29/2006   Past Medical History:  Diagnosis Date  . Asthma   . Diabetes mellitus    sees Dr. Elvera Lennox   . Gout   . Hyperlipidemia   . Hypertension     Family History  Problem Relation Age of Onset  . Breast cancer Mother     No past surgical history on file. Social History   Occupational History  . Not on file.   Social History Main Topics  . Smoking status: Never Smoker  . Smokeless tobacco: Never Used  . Alcohol  use 0.0 oz/week     Comment: once a month  . Drug use: No  . Sexual activity: Not on file

## 2016-10-19 ENCOUNTER — Encounter (INDEPENDENT_AMBULATORY_CARE_PROVIDER_SITE_OTHER): Payer: Self-pay | Admitting: Orthopaedic Surgery

## 2016-10-26 ENCOUNTER — Encounter: Payer: Self-pay | Admitting: Orthopaedic Surgery

## 2016-10-26 DIAGNOSIS — M67332 Transient synovitis, left wrist: Secondary | ICD-10-CM | POA: Diagnosis not present

## 2016-11-02 ENCOUNTER — Ambulatory Visit (INDEPENDENT_AMBULATORY_CARE_PROVIDER_SITE_OTHER): Payer: Self-pay | Admitting: Physician Assistant

## 2016-11-02 DIAGNOSIS — S6982XD Other specified injuries of left wrist, hand and finger(s), subsequent encounter: Secondary | ICD-10-CM

## 2016-11-02 NOTE — Progress Notes (Signed)
[name] 1 week status post left wrist arthroscopy with debridement of triangular fibrocartilage and partial synovectomy. Overall doing well. States the wrist Little's stiff and sore. Been taking Motrin for pain.  Left wrist, he has decreased motion with dorsiflexion and volar flexion and ulnar and radial deviation. Port sites are well approximated with nylon sutureinfection. Radial pulse 2+. Full motor and sensation of the left hand.  Plan: We'll send him to physical therapy to work on his range of motion strengthening of the left wrist. We'll keep him out of work for the next month until follow-up. Sutures were removed today. He is working on Social research officer, governmentscar tissue mobilization.

## 2016-11-07 ENCOUNTER — Encounter: Payer: Self-pay | Admitting: Family Medicine

## 2016-11-09 ENCOUNTER — Other Ambulatory Visit: Payer: Self-pay | Admitting: Family Medicine

## 2016-11-09 MED ORDER — LOSARTAN POTASSIUM-HCTZ 100-25 MG PO TABS: 1.0000 | ORAL_TABLET | Freq: Every day | ORAL | 3 refills | Status: DC

## 2016-11-09 NOTE — Telephone Encounter (Signed)
See my chart request

## 2016-11-09 NOTE — Telephone Encounter (Signed)
Cancel Diovan HCT. Call in Hyzaar 100/25 daily, #90 with 3 rf

## 2016-12-02 IMAGING — MR MR WRIST*L* W/CM
4 of 5 series · 24 of 40 positions shown · IV contrast (agent unspecified)
Comparison: None.

CLINICAL DATA: Chronic left wrist pain for 4 years. Worsening pain
after weight lifting injury 1 month ago.

EXAM:
MRI OF THE LEFT WRIST WITH CONTRAST(MR Arthrogram)
TECHNIQUE: Multiplanar, multisequence MR imaging of the wrist was performed
immediately following contrast injection into the radiocarpal joint
under fluoroscopic guidance. No intravenous contrast was
administered.

[Series 4: T2 fat-sat · axial · 3.0mm · 0.23mm/px · z∈[-22,+57]mm · 9 of 24 slices shown (1 of 2)]
[im 1/24]
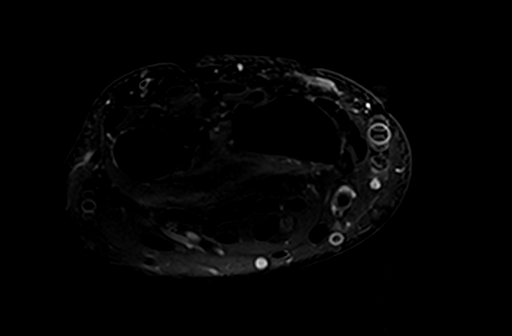
[im 3/24]
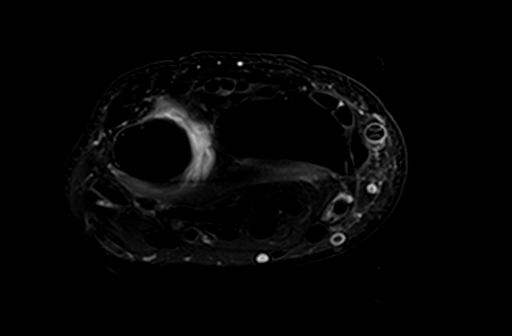
[im 6/24]
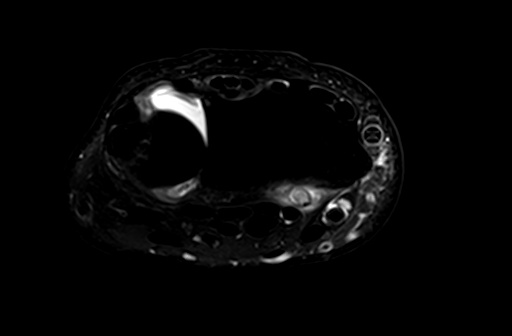
[im 9/24]
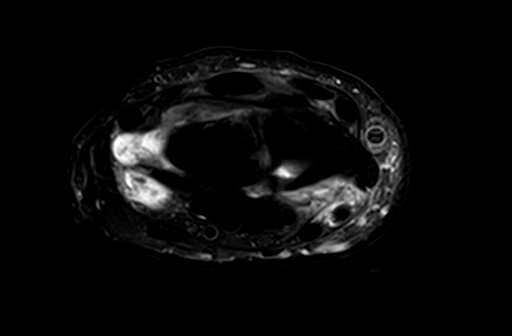
[im 12/24]
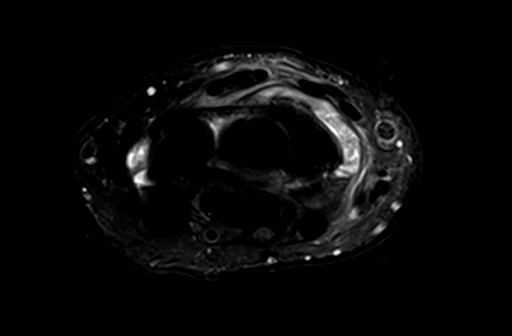
[im 15/24]
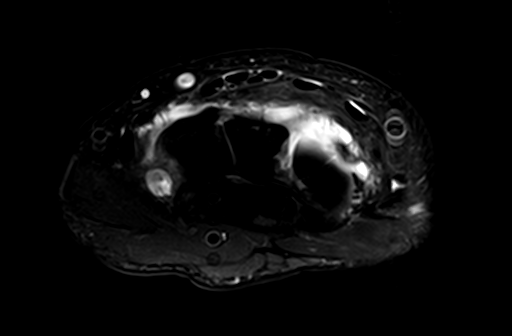
[im 18/24]
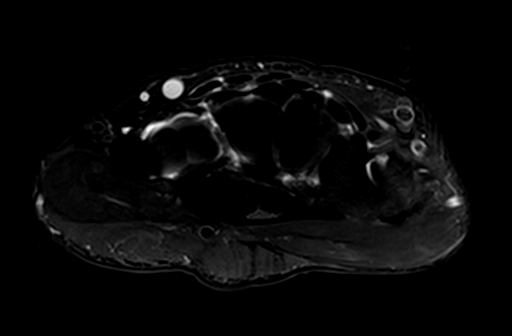
[im 21/24]
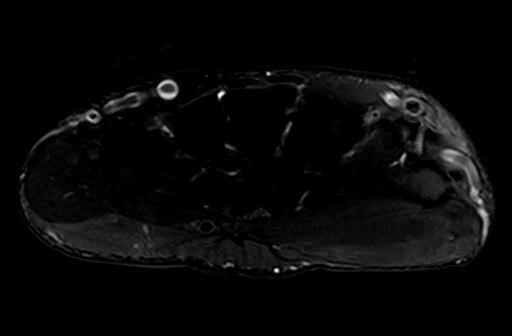
[im 24/24]
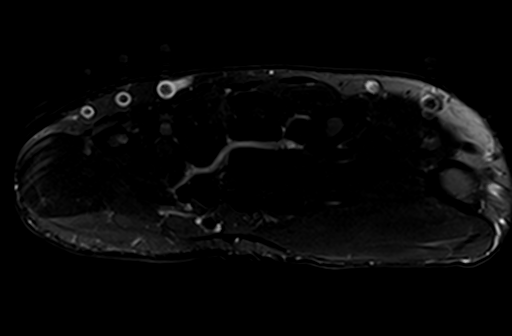

[Series 5: t1_tse_sag_fs · sagittal · 3.0mm · 0.23mm/px · 5 of 24 slices shown]
[im 1/24]
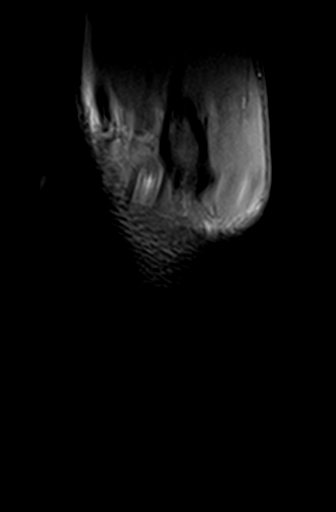
[im 3/24]
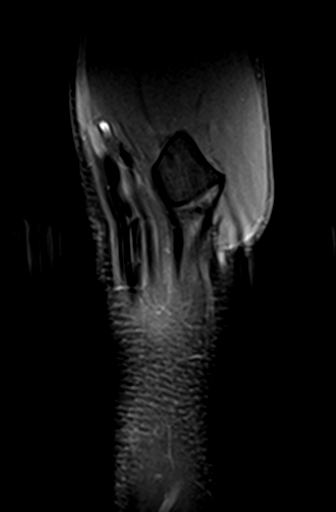
[im 8/24]
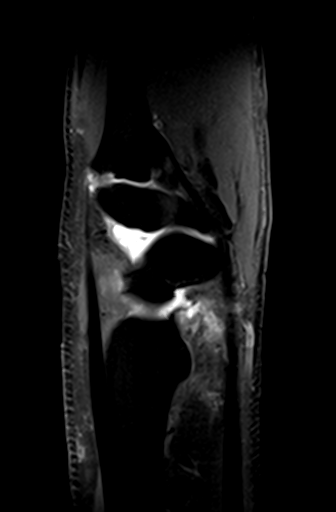
[im 13/24]
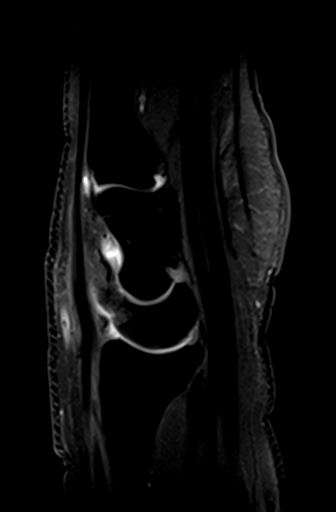
[im 21/24]
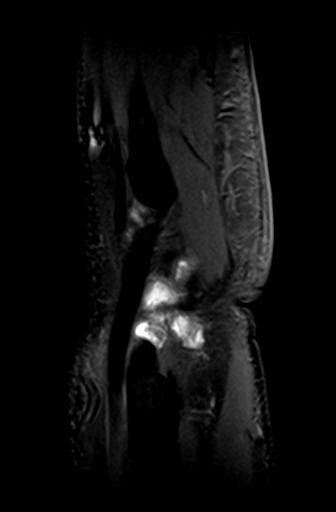

[Series 6: t1_tse_cor_fs · coronal · 3.0mm · 0.23mm/px · 3 of 16 slices shown]
[im 3/16]
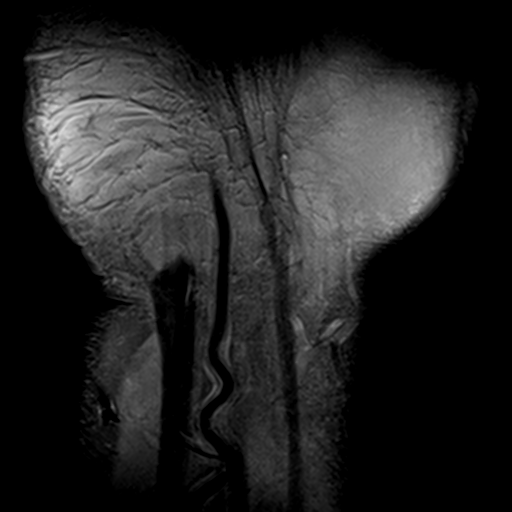
[im 8/16]
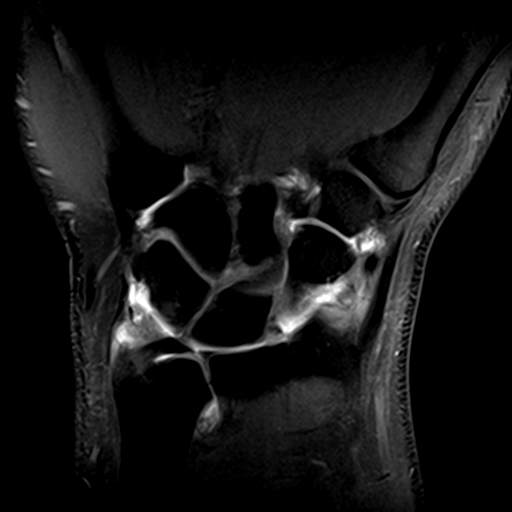
[im 13/16]
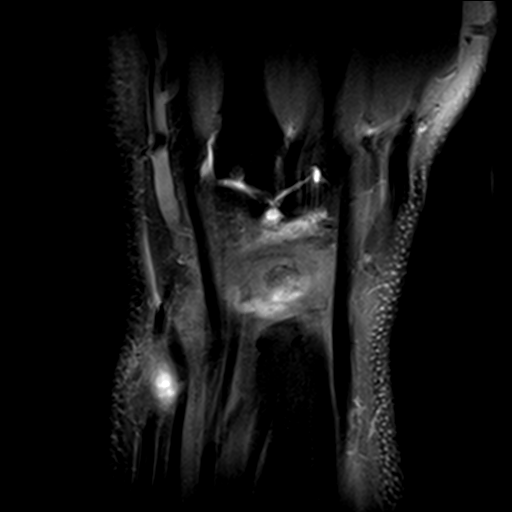

[Series 9: T2 fat-sat · coronal · 3.0mm · 0.23mm/px · 7 of 16 slices shown (2 of 2)]
[im 1/16]
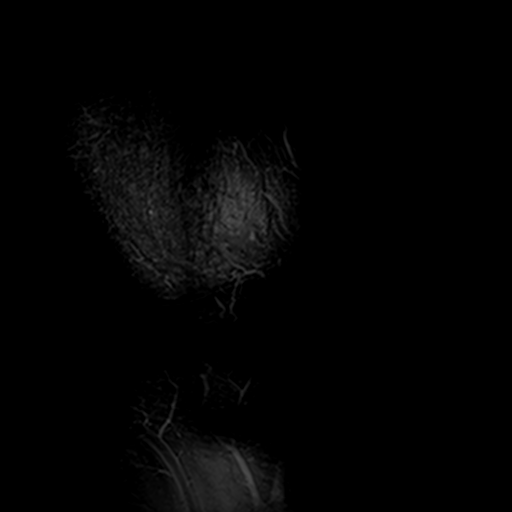
[im 3/16]
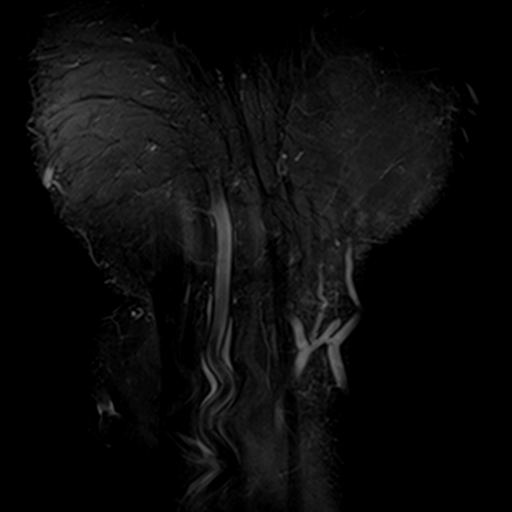
[im 6/16]
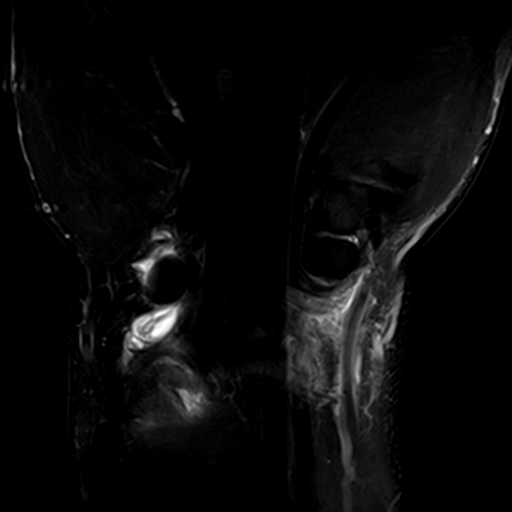
[im 8/16]
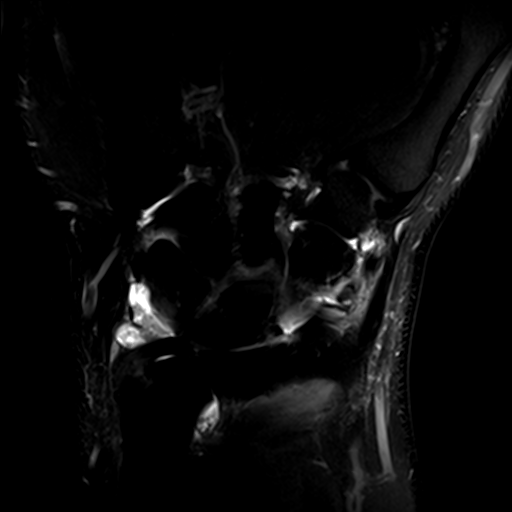
[im 11/16]
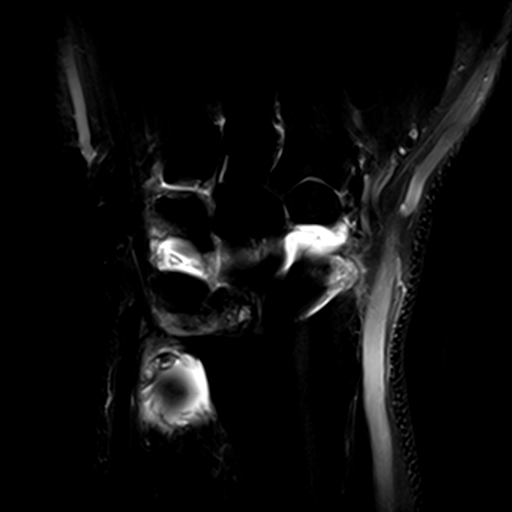
[im 13/16]
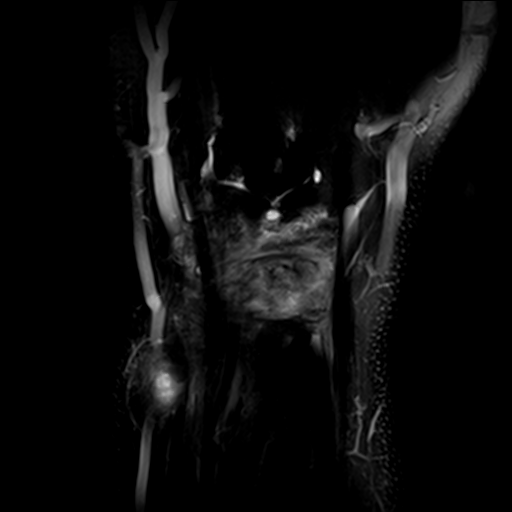
[im 16/16]
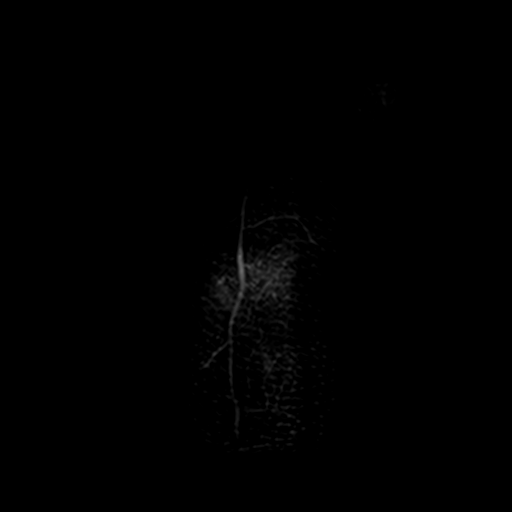

[24 of 40 positions shown; findings below may reference images not displayed]

FINDINGS: Ligaments: Extravasation of contrast is noted into the midcarpal
joint space secondary to a small scapholunate ligament tear
involving the dorsal band. The lunotriquetral ligament is intact.

Triangular fibrocartilage: Central tear with extravasation of
contrast into the radioulnar joint space.

Tendons: Intact.

Carpal tunnel/median nerve: Normal

Guyon's canal: Normal

Joint/cartilage: Mild degenerative changes with subchondral cysts
noted in the lunate. This also moderate synovitis with moderate
synovial thickening.

Bones/carpal alignment: No acute bony findings. Carpal alignment is
nor. Mild radiocarpal and radioulnar joint degenerative changes.

Other: Normal appearance of the wrist and visualized hand
musculature.
IMPRESSION: 1. Central tear of the TFCC.
2. Small tear involving the dorsal band of the scapholunate
ligament.
3. Mild degenerative changes and moderate synovitis. Could not
exclude an early inflammatory arthropathy.
4. No acute bony findings and intact dorsal and volar wrist tendons.

## 2016-12-05 ENCOUNTER — Ambulatory Visit (INDEPENDENT_AMBULATORY_CARE_PROVIDER_SITE_OTHER): Payer: Managed Care, Other (non HMO) | Admitting: Orthopaedic Surgery

## 2016-12-06 ENCOUNTER — Ambulatory Visit (INDEPENDENT_AMBULATORY_CARE_PROVIDER_SITE_OTHER): Payer: Managed Care, Other (non HMO) | Admitting: Orthopaedic Surgery

## 2016-12-06 DIAGNOSIS — M25532 Pain in left wrist: Secondary | ICD-10-CM | POA: Insufficient documentation

## 2016-12-06 NOTE — Progress Notes (Signed)
Zachary [name] is following up status post a left wrist arthroscopy. We found significant synovitis in the wrist and is doing great right now. He came in today to get a note just allow him to return for duties without restrictions. You performed examination is no swelling about his left wrist. His incision sites healed nicely. He is excellent range of motion of the wrist is pain-free.  Knowing and well he knows he will come back and see me if he has any issues with this wrist at all or for anything else. All questions were encouraged and answered.

## 2016-12-07 ENCOUNTER — Ambulatory Visit (INDEPENDENT_AMBULATORY_CARE_PROVIDER_SITE_OTHER): Payer: Managed Care, Other (non HMO) | Admitting: Orthopaedic Surgery

## 2016-12-14 ENCOUNTER — Encounter: Payer: Self-pay | Admitting: Family Medicine

## 2016-12-21 ENCOUNTER — Ambulatory Visit (INDEPENDENT_AMBULATORY_CARE_PROVIDER_SITE_OTHER): Payer: Managed Care, Other (non HMO) | Admitting: Internal Medicine

## 2016-12-21 ENCOUNTER — Ambulatory Visit: Payer: Managed Care, Other (non HMO) | Admitting: Internal Medicine

## 2016-12-21 ENCOUNTER — Ambulatory Visit: Payer: Managed Care, Other (non HMO) | Admitting: Endocrinology

## 2016-12-21 ENCOUNTER — Encounter: Payer: Self-pay | Admitting: Internal Medicine

## 2016-12-21 VITALS — BP 130/80 | HR 76 | Wt 319.0 lb

## 2016-12-21 DIAGNOSIS — E785 Hyperlipidemia, unspecified: Secondary | ICD-10-CM | POA: Diagnosis not present

## 2016-12-21 DIAGNOSIS — E1165 Type 2 diabetes mellitus with hyperglycemia: Secondary | ICD-10-CM

## 2016-12-21 LAB — POCT GLYCOSYLATED HEMOGLOBIN (HGB A1C): HEMOGLOBIN A1C: 7.5

## 2016-12-21 MED ORDER — METFORMIN HCL ER 500 MG PO TB24: 1000.0000 mg | ORAL_TABLET | Freq: Two times a day (BID) | ORAL | 3 refills | Status: DC

## 2016-12-21 NOTE — Addendum Note (Signed)
Addended by: Darene Lamer T on: 12/21/2016 10:06 AM   Modules accepted: Orders

## 2016-12-21 NOTE — Patient Instructions (Addendum)
Please increase: - Metformin ER 2000  Mg with dinner  Please continue: - Januvia 100 mg in am, before b'fast  Please return in 3 months with your sugar log.

## 2016-12-21 NOTE — Progress Notes (Signed)
Patient ID: Zachary [name], male   DOB: 26-Aug-1965, 51 y.o.   MRN: [id number]  HPI: Zachary [name] is a 51 y.o.-year-old male, returning for f/u for DM2, dx in 2013, non-insulin-dependent, controlled, with complications (CKD, MAU). Last visit 2 years and 1 mo ago!  Last hemoglobin A1c was: Lab Results  Component Value Date   HGBA1C 10.5 (H) 09/04/2016   HGBA1C 6.2 09/02/2015   HGBA1C 6.0 09/23/2014  11/28/2013: HbA1c 9.6% He had periods of noncompliance with Metformin b/c GI issues.  Pt is on a regimen of: - Metformin XR 1000 mg in am - Tradjenta 5 mg in am >> insurance did not cover it >> started Januvia 100 mg in am  Pt does check his sugars 2x a day at home: - am: 98-128, 148 >> 90-100 >> 140-200 - 2h after b'fast: 108-137 >> 100 >> n/c  - before lunch: 98-114 >> 90-118 >> 90-100 - 2h after lunch: 88-132 >> 87-110 >> n/c - before dinner: 86-106, 210x1 >> 102-110 >> 90-100 - 2h after dinner: 124-140 >> n/c - bedtime: 81-150, 184x1 (forgot Metformin) >> 88-100 >> 140s  Pt's meals are: - Breakfast: eggs, bacon, or breakfast burrito + oatmeal; before: soft drink (30 oz) - Lunch: soup and sandwich - Dinner: meat + 2 veggies - Snacks:once daily: granola bar, chips >> now fruit  - he has CKD, last BUN/creatinine:   Lab Results  Component Value Date   BUN 16 09/04/2016   CREATININE 1.24 09/04/2016  10/27/2014: 18/1.3 11/28/2013: 14/1.4  ACR: Lab Results  Component Value Date   MICRALBCREAT 7.2 09/02/2015   MICRALBCREAT 5.5 08/14/2014   MICRALBCREAT 11.0 03/10/2014  ACR (11/2013): 33 Prev. Divan >>Losartan 100.  - last set of lipids: Lab Results  Component Value Date   CHOL 167 09/04/2016   HDL 39.50 09/04/2016   LDLCALC 107 (H) 09/04/2016   LDLDIRECT 177.7 11/17/2011   TRIG 100.0 09/04/2016   CHOLHDL 4 09/04/2016  11/28/2012: 240/113/48/168 On Crestor 20.  - last eye exam was in 08/2016. No DR. Kathryne [name]. - denies numbness and tingling in his feet.  On ASA  81.  ROS: Constitutional: no weight gain/no weight loss, no fatigue, no subjective hyperthermia, no subjective hypothermia Eyes: no blurry vision, no xerophthalmia ENT: no sore throat, no nodules palpated in throat, no dysphagia, no odynophagia, no hoarseness Cardiovascular: no CP/no SOB/no palpitations/no leg swelling Respiratory: no cough/no SOB/no wheezing Gastrointestinal: no N/no V/no D/no C/no acid reflux Musculoskeletal: no muscle aches/no joint aches Skin: no rashes, no hair loss Neurological: no tremors/no numbness/no tingling/no dizziness  I reviewed pt's medications, allergies, PMH, social hx, family hx, and changes were documented in the history of present illness. Otherwise, unchanged from my initial visit note.  Past Medical History:  Diagnosis Date  . Asthma   . Diabetes mellitus    sees Dr. Elvera [name]   . Gout   . Hyperlipidemia   . Hypertension    Past surgical history: - Wisdom teeth extraction  History   Social History  . Marital Status: Married    Spouse Name: N/A    Number of Children: 2   Social History Main Topics  . Smoking status: Never Smoker   . Smokeless tobacco: Never Used  . Alcohol Use:      2 glasses once a week - red wine        . 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 albuterol (PROAIR HFA) 108 (90 BASE) MCG/ACT inhaler Inhale 2 puffs into the lungs every 4 (four) hours as needed for wheezing. 1 Inhaler 5  . amLODipine (NORVASC) 10 MG tablet Take 1 tablet (10 mg total) by mouth daily. 90 tablet 3  . aspirin 81 MG tablet Take 81 mg by mouth daily.     . carvedilol (COREG) 12.5 MG tablet TAKE 1 TABLET (12.5 MG TOTAL) BY MOUTH 2 (TWO) TIMES DAILY WITH A MEAL. 60 tablet 6  . glucose blood (ONETOUCH VERIO) test strip 1 each by Other route 4 (four) times daily.  And lancets 4/day 400 each 3  . metFORMIN (GLUCOPHAGE-XR) 500 MG 24 hr tablet Take 2 tablets (1,000 mg total) by mouth daily with breakfast. 180 tablet 3  . rosuvastatin (CRESTOR) 40 MG tablet TAKE 1/2 TABLETS (20 MG TOTAL) BY MOUTH DAILY. 90 tablet 3  . sildenafil (REVATIO) 20 MG tablet Take 5 tablets (100 mg total) by mouth once as needed. 50 tablet 11  . sitaGLIPtin (JANUVIA) 100 MG tablet Take 1 tablet (100 mg total) by mouth daily. 30 tablet 11  . traMADol (ULTRAM) 50 MG tablet Take 1 tablet (50 mg total) by mouth every 6 (six) hours as needed. 40 tablet 0   No current facility-administered medications on file prior to visit.    Allergies  Allergen Reactions  . Ace Inhibitors     REACTION: cough  . Sulfamethoxazole-Trimethoprim     REACTION: unspecified   Family History  Problem Relation Age of Onset  . Breast cancer Mother    PE: BP 130/80 (BP Location: Left Arm, Patient Position: Sitting)   Pulse 76   Wt (!) 319 lb (144.7 kg)   SpO2 96%   BMI 43.26 kg/m  Body mass index is 43.26 kg/m. Wt Readings from Last 3 Encounters:  12/21/16 (!) 319 lb (144.7 kg)  09/20/16 (!) 320 lb (145.2 kg)  09/04/16 (!) 328 lb (148.8 kg)   Constitutional: obese, in NAD Eyes: PERRLA, EOMI, no exophthalmos ENT: moist mucous membranes, no thyromegaly, no cervical lymphadenopathy Cardiovascular: RRR, No MRG Respiratory: CTA B Gastrointestinal: abdomen soft, NT, ND, BS+ Musculoskeletal: no deformities, strength intact in all 4 Skin: moist, warm, no rashes Neurological: no tremor with outstretched hands, DTR normal in all 4  ASSESSMENT: 1. DM2, non-insulin-dependent, controlled, without long term complications, but + HGly 2. HL  PLAN:  1. Patient with h/o controlled DM, but worsened after coming off Tradjenta. He returns after a 2 year absence, with last HbA1c >10%. Since then, PCP added Januvia, which is covvered >> sugars better during the day, but high in am as he moved all Metformin dose  in am (1000 mg). Will increase Metformin ER to 200 mg daily as his F=GFR allows, and move it at bedtime to help with am sugars. -  I suggested to:   Patient Instructions  Please increase: - Metformin ER 2000  Mg with dinner  Please continue: - Januvia 100 mg in am, before b'fast  Please return in 3 months with your sugar log.   - today, HbA1c is 7.6% (better!) - continue checking sugars at different times of the day - check 1x a day, rotating checks - advised for yearly eye exams >> he is UTD - Return to clinic in 3 mo with sugar log   2. HL - On Crestor now  -  latest LDL was much better, butr still above goal. Improvement in DM control will also help.

## 2017-03-30 ENCOUNTER — Ambulatory Visit: Payer: Managed Care, Other (non HMO) | Admitting: Internal Medicine

## 2017-03-30 ENCOUNTER — Encounter: Payer: Self-pay | Admitting: Internal Medicine

## 2017-03-30 VITALS — BP 122/80 | HR 86 | Ht 72.0 in | Wt 327.6 lb

## 2017-03-30 DIAGNOSIS — E1165 Type 2 diabetes mellitus with hyperglycemia: Secondary | ICD-10-CM

## 2017-03-30 DIAGNOSIS — E785 Hyperlipidemia, unspecified: Secondary | ICD-10-CM | POA: Diagnosis not present

## 2017-03-30 LAB — POCT GLYCOSYLATED HEMOGLOBIN (HGB A1C): Hemoglobin A1C: 9.1

## 2017-03-30 MED ORDER — EMPAGLIFLOZIN 25 MG PO TABS: 25.0000 mg | ORAL_TABLET | Freq: Every day | ORAL | 5 refills | Status: DC

## 2017-03-30 NOTE — Patient Instructions (Addendum)
Please continue:  - Metformin ER but take 2000 mg with dinner - Januvia 100 mg in am, before b'fast  Please add: - Jardiance 25 mg in am, before b'fast  Please return in 3 months with your sugar log.

## 2017-03-30 NOTE — Progress Notes (Signed)
Patient ID: Zachary [name], male   DOB: September 27, 1965, 52 y.o.   MRN: [id number]  HPI: Zachary [name] is a 52 y.o.-year-old male, returning for f/u for DM2, dx in 2013, non-insulin-dependent, controlled, with complications (CKD, MAU).  Last visit 3 months ago.  Last hemoglobin A1c was: Lab Results  Component Value Date   HGBA1C 7.5 12/21/2016   HGBA1C 10.5 (H) 09/04/2016   HGBA1C 6.2 09/02/2015  11/28/2013: HbA1c 9.6% He had periods of noncompliance with Metformin because of GI issues.  Pt is on a regimen of: - Metformin XR 1000 mg 2x a day - Tradjenta 5 mg in am >> insurance did not cover it >> now on Januvia 100 mg in am  Pt does check his sugars twice a day: - am: 98-128, 148 >> 90-100 >> 140-200 >> 180-200 - 2h after b'fast: 108-137 >> 100 >> n/c  - before lunch: 98-114 >> 90-118 >> 90-100 >> 100-110 - 2h after lunch: 88-132 >> 87-110 >> n/c - before dinner: 86-106, 210x1 >> 102-110 >> 100-110 - 2h after dinner: 124-140 >> n/c - bedtime: 81-150, 184x1 (forgot Metformin) >> 88-100 >> 140s >> 200s  Pt's meals are: - Breakfast: eggs, bacon, or breakfast burrito + oatmeal; before: soft drink (30 oz) - Lunch: soup and sandwich - Dinner: meat + 2 veggies - Snacks:once daily: granola bar, chips >> now fruit  -+ CKD, last BUN/creatinine:   Lab Results  Component Value Date   BUN 16 09/04/2016   CREATININE 1.24 09/04/2016  10/27/2014: 18/1.3 11/28/2013: 14/1.4  ACR: Lab Results  Component Value Date   MICRALBCREAT 7.2 09/02/2015   MICRALBCREAT 5.5 08/14/2014   MICRALBCREAT 11.0 03/10/2014  ACR (11/2013): 33 On losartan 100.  -+ HL; last set of lipids: Lab Results  Component Value Date   CHOL 167 09/04/2016   HDL 39.50 09/04/2016   LDLCALC 107 (H) 09/04/2016   LDLDIRECT 177.7 11/17/2011   TRIG 100.0 09/04/2016   CHOLHDL 4 09/04/2016  11/28/2012: 240/113/48/168 On Crestor 20.  - last eye exam was in 08/2016: No DR Zachary [name]. - No numbness and tingling in his  feet.  On ASA 81.  ROS: Constitutional: no weight gain/no weight loss, no fatigue, no subjective hyperthermia, no subjective hypothermia Eyes: no blurry vision, no xerophthalmia ENT: no sore throat, no nodules palpated in throat, no dysphagia, no odynophagia, no hoarseness Cardiovascular: no CP/no SOB/no palpitations/no leg swelling Respiratory: no cough/no SOB/no wheezing Gastrointestinal: no N/no V/no D/no C/no acid reflux Musculoskeletal: no muscle aches/no joint aches Skin: no rashes, no hair loss Neurological: no tremors/no numbness/no tingling/no dizziness + pbs with erections  I reviewed pt's medications, allergies, PMH, social hx, family hx, and changes were documented in the history of present illness. Otherwise, unchanged from my initial visit note.  Past Medical History:  Diagnosis Date  . Asthma   . Diabetes mellitus    sees Dr. Elvera [name]   . Gout   . Hyperlipidemia   . Hypertension    Past surgical history: - Wisdom teeth extraction  History   Social History  . Marital Status: Married    Spouse Name: N/A    Number of Children: 2   Social History Main Topics  . Smoking status: Never Smoker   . Smokeless tobacco: Never Used  . Alcohol Use:      2 glasses once a week - red wine        . 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 albuterol (PROAIR HFA) 108 (90 BASE) MCG/ACT inhaler Inhale 2 puffs into the lungs every 4 (four) hours as needed for wheezing. 1 Inhaler 5  . amLODipine (NORVASC) 10 MG tablet Take 1 tablet (10 mg total) by mouth daily. 90 tablet 3  . aspirin 81 MG tablet Take 81 mg by mouth daily.     . carvedilol (COREG) 12.5 MG tablet TAKE 1 TABLET (12.5 MG TOTAL) BY MOUTH 2 (TWO) TIMES DAILY WITH A MEAL. 60 tablet 6  . glucose blood (ONETOUCH VERIO) test strip 1 each  by Other route 4 (four) times daily. And lancets 4/day 400 each 3  . losartan (COZAAR) 100 MG tablet Take 100 mg by mouth daily.    . metFORMIN (GLUCOPHAGE-XR) 500 MG 24 hr tablet Take 2 tablets (1,000 mg total) by mouth 2 (two) times daily after a meal. 360 tablet 3  . rosuvastatin (CRESTOR) 40 MG tablet TAKE 1/2 TABLETS (20 MG TOTAL) BY MOUTH DAILY. 90 tablet 3  . sildenafil (REVATIO) 20 MG tablet Take 5 tablets (100 mg total) by mouth once as needed. 50 tablet 11  . sitaGLIPtin (JANUVIA) 100 MG tablet Take 1 tablet (100 mg total) by mouth daily. 30 tablet 11  . traMADol (ULTRAM) 50 MG tablet Take 1 tablet (50 mg total) by mouth every 6 (six) hours as needed. 40 tablet 0   No current facility-administered medications on file prior to visit.    Allergies  Allergen Reactions  . Ace Inhibitors     REACTION: cough  . Sulfamethoxazole-Trimethoprim     REACTION: unspecified   Family History  Problem Relation Age of Onset  . Breast cancer Mother    PE: BP 122/80   Pulse 86   Ht 6' (1.829 m)   Wt (!) 327 lb 9.6 oz (148.6 kg)   SpO2 96%   BMI 44.43 kg/m  Body mass index is 44.43 kg/m. Wt Readings from Last 3 Encounters:  03/30/17 (!) 327 lb 9.6 oz (148.6 kg)  12/21/16 (!) 319 lb (144.7 kg)  09/20/16 (!) 320 lb (145.2 kg)   Constitutional: overweight, in NAD Eyes: PERRLA, EOMI, no exophthalmos ENT: moist mucous membranes, no thyromegaly, no cervical lymphadenopathy Cardiovascular: RRR, No MRG Respiratory: CTA B Gastrointestinal: abdomen soft, NT, ND, BS+ Musculoskeletal: no deformities, strength intact in all 4 Skin: moist, warm, no rashes Neurological: no tremor with outstretched hands, DTR normal in all 4  ASSESSMENT: 1. DM2, non-insulin-dependent, controlled, without long term complications, but +  hyperglycemia  2. HL  3. Obesity class 3  PLAN:  1. Patient with h/o controlled DM2, on Metformin ER and Januvia. He gained 8 lbs over the Holidays. Sugars are higher in am,  and probably after meals, but not checking then. - we discussed the need to lose weight and improve diet but will also start Jardiance we discussed about SEs of Jardiance, which are: dizziness (advised to be careful when stands from sitting position), decreased BP - usually not < normal (BP today is not low), and fungal UTIs (advised to let me know if develops one).  - given discount card for Jardiance - will also move Metformin at dinnertime to improve am sugars -  I suggested to:   Patient Instructions  Please continue:  - Metformin ER but take 2000 mg with dinner - Januvia 100  mg in am, before b'fast  Please add: - Jardiance 25 mg in am, before b'fast  Please return in 3 months with your sugar log.   - today, HbA1c is 9.1% (higher) - continue checking sugars at different times of the day - check 1x a day, rotating checks - advised for yearly eye exams >> he is UTD - Return to clinic in 3 mo with sugar log    2. HL - Reviewed latest lipid panel (08/2016): LDL much improved, but still above goal.  - Continue Crestor.  No side effects.  3. Obesity class 3 - continues to gain weight - 8 lbs since last visit - strongly advised him to revise his diet and start losing weight - will also start Jardiance which should help  Carlus Pavlovristina Minerva Bluett, MD PhD [facility]

## 2017-03-30 NOTE — Addendum Note (Signed)
Addended by: Yolande JollyLAWSON, Kriston Pasquarello on: 03/30/2017 11:34 AM   Modules accepted: Orders

## 2017-06-29 ENCOUNTER — Ambulatory Visit: Payer: Managed Care, Other (non HMO) | Admitting: Internal Medicine

## 2017-07-29 ENCOUNTER — Other Ambulatory Visit: Payer: Self-pay | Admitting: Family Medicine

## 2017-07-30 NOTE — Telephone Encounter (Signed)
Last OV  09/04/2016   valsartain-HCTZ I do NOT see on pt current medication list  Carvedilol was last refilled 07/10/2016 disp 60 with 6 refills  Sent to PCP for approval

## 2017-09-17 ENCOUNTER — Encounter: Payer: Self-pay | Admitting: Family Medicine

## 2017-09-23 ENCOUNTER — Other Ambulatory Visit: Payer: Self-pay | Admitting: Family Medicine

## 2017-09-28 ENCOUNTER — Other Ambulatory Visit: Payer: Self-pay | Admitting: Family Medicine

## 2017-10-02 NOTE — Telephone Encounter (Signed)
Last OV 09/04/2016   Last refilled 09/04/2016 disp 50 with 11 refills   Sent to PCP for approval

## 2017-11-06 ENCOUNTER — Other Ambulatory Visit: Payer: Self-pay | Admitting: Family Medicine

## 2018-02-04 ENCOUNTER — Other Ambulatory Visit: Payer: Self-pay | Admitting: Family Medicine

## 2018-02-26 NOTE — Telephone Encounter (Signed)
Pt has not been seen in office since 09/04/16.

## 2018-03-15 ENCOUNTER — Other Ambulatory Visit: Payer: Self-pay | Admitting: Family Medicine

## 2018-04-20 ENCOUNTER — Other Ambulatory Visit: Payer: Self-pay | Admitting: Family Medicine

## 2018-05-08 ENCOUNTER — Encounter: Payer: Self-pay | Admitting: Family Medicine

## 2018-05-13 ENCOUNTER — Encounter: Payer: Self-pay | Admitting: Family Medicine

## 2018-05-14 NOTE — Telephone Encounter (Signed)
FYI for you Dr. Clent Ridges.

## 2018-05-21 ENCOUNTER — Encounter: Payer: Self-pay | Admitting: Family Medicine

## 2018-05-22 ENCOUNTER — Encounter: Payer: Self-pay | Admitting: Family Medicine

## 2018-05-22 ENCOUNTER — Ambulatory Visit (INDEPENDENT_AMBULATORY_CARE_PROVIDER_SITE_OTHER): Payer: Managed Care, Other (non HMO) | Admitting: Family Medicine

## 2018-05-22 VITALS — BP 120/72 | HR 74 | Temp 98.1°F | Ht 74.0 in | Wt 287.1 lb

## 2018-05-22 DIAGNOSIS — Z0000 Encounter for general adult medical examination without abnormal findings: Secondary | ICD-10-CM

## 2018-05-22 DIAGNOSIS — Z125 Encounter for screening for malignant neoplasm of prostate: Secondary | ICD-10-CM

## 2018-05-22 DIAGNOSIS — R0602 Shortness of breath: Secondary | ICD-10-CM

## 2018-05-22 MED ORDER — GLUCOSE BLOOD VI STRP: 1.0000 | ORAL_STRIP | Freq: Four times a day (QID) | 3 refills | Status: DC

## 2018-05-22 MED ORDER — SILDENAFIL CITRATE 20 MG PO TABS: ORAL_TABLET | ORAL | 10 refills | Status: DC

## 2018-05-22 MED ORDER — METFORMIN HCL ER 500 MG PO TB24: ORAL_TABLET | ORAL | 3 refills | Status: DC

## 2018-05-22 MED ORDER — ROSUVASTATIN CALCIUM 20 MG PO TABS: 20.0000 mg | ORAL_TABLET | Freq: Every day | ORAL | 3 refills | Status: DC

## 2018-05-22 MED ORDER — VALSARTAN-HYDROCHLOROTHIAZIDE 320-25 MG PO TABS: 1.0000 | ORAL_TABLET | Freq: Every day | ORAL | 3 refills | Status: DC

## 2018-05-22 NOTE — Progress Notes (Addendum)
Subjective:    Patient ID: Zachary [name], male    DOB: 1965/07/24, 53 y.o.   MRN: [id number]  HPI Here for a well exam. He feels fine and is pleased to tell about the weight loss program he has been involved with for the past 2 months. He is seeing Courtney Paris NP at the [facility] on [address], and she has been managing his medications, diet, and exercise routines. He has lost about 26 lbs in an 8 week period. He is following an 1800 calorie per day diet. She has stopped Januvia and has started him on Ozempic injections weekly. He still takes Metformin as well. His recent labs were notable for a creatinine of 1.5, his AST is 86 and ALT is 132. He had an ECHO showing an EF of 55-60% and some LVH. His lipids were excellent with an LDL of 99.    Review of Systems  Constitutional: Negative.   HENT: Negative.   Eyes: Negative.   Respiratory: Negative.   Cardiovascular: Negative.   Gastrointestinal: Negative.   Genitourinary: Negative.   Musculoskeletal: Negative.   Skin: Negative.   Neurological: Negative.   Psychiatric/Behavioral: Negative.        Objective:   Physical Exam Constitutional:      General: He is not in acute distress.    Appearance: He is well-developed. He is not diaphoretic.  HENT:     Head: Normocephalic and atraumatic.     Right Ear: External ear normal.     Left Ear: External ear normal.     Nose: Nose normal.     Mouth/Throat:     Pharynx: No oropharyngeal exudate.  Eyes:     General: No scleral icterus.       Right eye: No discharge.        Left eye: No discharge.     Conjunctiva/sclera: Conjunctivae normal.     Pupils: Pupils are equal, round, and reactive to light.  Neck:     Musculoskeletal: Neck supple.     Thyroid: No thyromegaly.     Vascular: No JVD.     Trachea: No tracheal deviation.  Cardiovascular:     Rate and Rhythm: Normal rate and regular rhythm.     Heart sounds: Normal heart sounds. No murmur. No friction rub. No  gallop.      Comments: EKG is within normal limits  Pulmonary:     Effort: Pulmonary effort is normal. No respiratory distress.     Breath sounds: Normal breath sounds. No wheezing or rales.  Chest:     Chest wall: No tenderness.  Abdominal:     General: Bowel sounds are normal. There is no distension.     Palpations: Abdomen is soft. There is no mass.     Tenderness: There is no abdominal tenderness. There is no guarding or rebound.  Genitourinary:    Penis: Normal. No tenderness.      Prostate: Normal.     Rectum: Normal. Guaiac result negative.  Musculoskeletal: Normal range of motion.        General: No tenderness.  Lymphadenopathy:     Cervical: No cervical adenopathy.  Skin:    General: Skin is warm and dry.     Coloration: Skin is not pale.     Findings: No erythema or rash.  Neurological:     Mental Status: He is alert and oriented to person, place, and time.     Cranial Nerves: No cranial nerve deficit.  Motor: No abnormal muscle tone.     Coordination: Coordination normal.     Deep Tendon Reflexes: Reflexes are normal and symmetric. Reflexes normal.  Psychiatric:        Behavior: Behavior normal.        Thought Content: Thought content normal.        Judgment: Judgment normal.           Assessment & Plan:  Well exam. We discussed diet and exercise. Get labs today. Set up a colonoscopy with [facility].  Gershon Crane, MD

## 2018-05-23 ENCOUNTER — Encounter: Payer: Self-pay | Admitting: Family Medicine

## 2018-05-23 DIAGNOSIS — Z0000 Encounter for general adult medical examination without abnormal findings: Secondary | ICD-10-CM

## 2018-05-23 LAB — BASIC METABOLIC PANEL
BUN: 18 mg/dL (ref 6–23)
CALCIUM: 9.9 mg/dL (ref 8.4–10.5)
CO2: 27 mEq/L (ref 19–32)
Chloride: 101 mEq/L (ref 96–112)
Creatinine, Ser: 1.74 mg/dL — ABNORMAL HIGH (ref 0.40–1.50)
GFR: 41.61 mL/min — ABNORMAL LOW (ref >60.00)
Glucose, Bld: 66 mg/dL — ABNORMAL LOW (ref 70–99)
Potassium: 4.3 mEq/L (ref 3.5–5.1)
Sodium: 141 mEq/L (ref 135–145)

## 2018-05-23 LAB — HEPATIC FUNCTION PANEL
ALT: 66 U/L — ABNORMAL HIGH (ref 0–53)
AST: 44 U/L — ABNORMAL HIGH (ref 0–37)
Albumin: 4.9 g/dL (ref 3.5–5.2)
Alkaline Phosphatase: 57 U/L (ref 39–117)
Bilirubin, Direct: 0.2 mg/dL (ref 0.0–0.3)
Total Bilirubin: 1.1 mg/dL (ref 0.2–1.2)
Total Protein: 7.8 g/dL (ref 6.0–8.3)

## 2018-05-23 LAB — PSA: PSA: 0.55 ng/mL (ref 0.10–4.00)

## 2018-05-24 NOTE — Telephone Encounter (Signed)
The referral was done  

## 2018-05-24 NOTE — Telephone Encounter (Signed)
FYI for Dr. Fry.  Thanks  

## 2018-05-27 ENCOUNTER — Encounter: Payer: Self-pay | Admitting: Family Medicine

## 2018-05-27 NOTE — Telephone Encounter (Signed)
Tell him to drink plenty of water and we should repeat a BMET in 2 weeks

## 2018-05-27 NOTE — Telephone Encounter (Signed)
Dr. Fry please advise. Thanks  

## 2018-05-29 ENCOUNTER — Encounter: Payer: Self-pay | Admitting: *Deleted

## 2018-06-12 ENCOUNTER — Encounter: Payer: Self-pay | Admitting: *Deleted

## 2018-06-12 ENCOUNTER — Telehealth: Payer: Self-pay | Admitting: *Deleted

## 2018-06-12 ENCOUNTER — Encounter: Payer: Self-pay | Admitting: Family Medicine

## 2018-06-12 NOTE — Telephone Encounter (Signed)
Copied from CRM [id number]. Topic: General - Inquiry >> Jun 12, 2018  9:01 AM Jolayne Haines L wrote: Reason for CRM: patient would like to know if he should still come tomorrow for his BMP with all that is going on. He said to let him know through mychart. He said he does not mind holding off unless Dr Clent Ridges thinks it is urgent.   My chart message sent back to the pt.

## 2018-06-13 ENCOUNTER — Other Ambulatory Visit: Payer: Managed Care, Other (non HMO)

## 2018-06-21 ENCOUNTER — Encounter: Payer: Self-pay | Admitting: Family Medicine

## 2018-06-24 ENCOUNTER — Encounter: Payer: Self-pay | Admitting: Family Medicine

## 2018-06-24 ENCOUNTER — Telehealth: Payer: Self-pay

## 2018-06-24 NOTE — Telephone Encounter (Signed)
Copied from CRM [id number]. Topic: General - Other >> Jun 24, 2018  8:44 AM Baldo Daub L wrote: Reason for CRM:   See MyChart message from 06/21/2018. Pt called and left voicemail on [facility] mailbox 06/21/2018.  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 Pt can be reached at [phone].

## 2018-06-24 NOTE — Telephone Encounter (Signed)
The letter is ready  

## 2018-06-24 NOTE — Telephone Encounter (Signed)
Called and spoke with pt and he is aware and stated that he already picked up the letter.

## 2018-06-24 NOTE — Telephone Encounter (Signed)
Dr. Fry please advise. Thanks  

## 2018-07-03 ENCOUNTER — Encounter: Payer: Self-pay | Admitting: Family Medicine

## 2018-07-04 ENCOUNTER — Other Ambulatory Visit: Payer: Self-pay

## 2018-07-04 ENCOUNTER — Encounter: Payer: Self-pay | Admitting: Family Medicine

## 2018-07-04 ENCOUNTER — Ambulatory Visit (INDEPENDENT_AMBULATORY_CARE_PROVIDER_SITE_OTHER): Payer: Managed Care, Other (non HMO) | Admitting: Family Medicine

## 2018-07-04 DIAGNOSIS — G4700 Insomnia, unspecified: Secondary | ICD-10-CM

## 2018-07-04 MED ORDER — TEMAZEPAM 30 MG PO CAPS: 30.0000 mg | ORAL_CAPSULE | Freq: Every evening | ORAL | 1 refills | Status: DC | PRN

## 2018-07-04 NOTE — Progress Notes (Signed)
Subjective:    Patient ID: Zachary RotundaJames T Hocker, male    DOB: 10/21/1965, 53 y.o.   MRN: [id number]  HPI Virtual Visit via Video Note  I connected with the patient on 07/04/18 at 10:00 AM EDT by a video enabled telemedicine application and verified that I am speaking with the correct person using two identifiers.  Location patient: home Location provider:work or home office Persons participating in the virtual visit: patient, provider  I discussed the limitations of evaluation and management by telemedicine and the availability of in person appointments. The patient expressed understanding and agreed to proceed.   HPI: He is asking for help with sleep. 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 He used temazepam successfully in the past. Otherwise he continues to watch his diet and is losing weight. Today he is down to 262 lbs.     ROS: See pertinent positives and negatives per HPI.  Past Medical History:  Diagnosis Date  . Asthma   . Binge eating disorder   . Diabetes mellitus   . GERD (gastroesophageal reflux disease)   . Gout   . Hyperlipidemia   . Hypertension   . Low magnesium level   . Vitamin D deficiency     History reviewed. No pertinent surgical history.  Family History  Problem Relation Age of Onset  . Breast cancer Mother      Current Outpatient Medications:  .  albuterol (PROAIR HFA) 108 (90 BASE) MCG/ACT inhaler, Inhale 2 puffs into the lungs every 4 (four) hours as needed for wheezing., Disp: 1 Inhaler, Rfl: 5 .  aspirin 81 MG tablet, Take 81 mg by mouth daily. , Disp: , Rfl:  .  carvedilol (COREG) 12.5 MG tablet, TAKE 1 TABLET (12.5 MG TOTAL) BY MOUTH 2 (TWO) TIMES DAILY WITH A MEAL., Disp: 180 tablet, Rfl: 5 .  esomeprazole (NEXIUM) 20 MG packet, Take 20 mg by mouth daily before breakfast., Disp: , Rfl:  .  glucose blood (ONETOUCH VERIO) test strip, 1 each by Other route 4 (four) times daily. And  lancets 4/day, Disp: 400 each, Rfl: 3 .  lisdexamfetamine (VYVANSE) 30 MG capsule, Take 30 mg by mouth daily., Disp: , Rfl:  .  magnesium oxide (MAG-OX) 400 MG tablet, Take 400 mg by mouth daily., Disp: , Rfl:  .  metFORMIN (GLUCOPHAGE-XR) 500 MG 24 hr tablet, TAKE 2 TABLETS BY MOUTH DAILY WITH BREAKFAST, Disp: 180 tablet, Rfl: 3 .  rosuvastatin (CRESTOR) 20 MG tablet, Take 1 tablet (20 mg total) by mouth daily., Disp: 90 tablet, Rfl: 3 .  Semaglutide,0.25 or 0.5MG /DOS, (OZEMPIC, 0.25 OR 0.5 MG/DOSE,) 2 MG/1.5ML SOPN, Inject 1 applicator into the skin once a week., Disp: , Rfl:  .  sildenafil (REVATIO) 20 MG tablet, TAKE 5 TABLETS BY MOUTH ONCE AS NEEDED, Disp: 50 tablet, Rfl: 10 .  valsartan-hydrochlorothiazide (DIOVAN-HCT) 320-25 MG tablet, Take 1 tablet by mouth daily., Disp: 90 tablet, Rfl: 3 .  temazepam (RESTORIL) 30 MG capsule, Take 1 capsule (30 mg total) by mouth at bedtime as needed for sleep., Disp: 30 capsule, Rfl: 1  EXAM:  VITALS per patient if applicable:  GENERAL: alert, oriented, appears well and in no acute distress  HEENT: atraumatic, conjunttiva clear, no obvious abnormalities on inspection of external nose and ears  NECK: normal movements of the head and neck  LUNGS: on inspection no signs of respiratory distress, breathing rate appears normal, no obvious gross SOB,  gasping or wheezing  CV: no obvious cyanosis  MS: moves all visible extremities without noticeable abnormality  PSYCH/NEURO: pleasant and cooperative, no obvious depression or anxiety, speech and thought processing grossly intact  ASSESSMENT AND PLAN: Insomnia, try temazepam 30 mg qhs.  Gershon Crane, MD  Discussed the following assessment and plan:  No diagnosis found.     I discussed the assessment and treatment plan with the patient. The patient was provided an opportunity to ask questions and all were answered. The patient agreed with the plan and demonstrated an understanding of the  instructions.   The patient was advised to call back or seek an in-person evaluation if the symptoms worsen or if the condition fails to improve as anticipated.     Review of Systems     Objective:   Physical Exam        Assessment & Plan:

## 2018-08-12 ENCOUNTER — Encounter: Payer: Self-pay | Admitting: Family Medicine

## 2018-08-20 ENCOUNTER — Ambulatory Visit (INDEPENDENT_AMBULATORY_CARE_PROVIDER_SITE_OTHER): Payer: Managed Care, Other (non HMO) | Admitting: Family Medicine

## 2018-08-20 ENCOUNTER — Encounter: Payer: Self-pay | Admitting: Family Medicine

## 2018-08-20 ENCOUNTER — Other Ambulatory Visit: Payer: Self-pay

## 2018-08-20 DIAGNOSIS — F900 Attention-deficit hyperactivity disorder, predominantly inattentive type: Secondary | ICD-10-CM

## 2018-08-20 DIAGNOSIS — E1165 Type 2 diabetes mellitus with hyperglycemia: Secondary | ICD-10-CM | POA: Diagnosis not present

## 2018-08-20 MED ORDER — LISDEXAMFETAMINE DIMESYLATE 70 MG PO CAPS: 70.0000 mg | ORAL_CAPSULE | Freq: Every day | ORAL | 0 refills | Status: DC

## 2018-08-20 NOTE — Progress Notes (Signed)
Subjective:    Patient ID: Zachary [name], male    DOB: 1966-03-15, 53 y.o.   MRN: [id number]  HPI Here to follow up several issues. First he had been seeing the [facility] in [place] for his ADHD, and they had increased his Vyvanse to 70 mg daily. This works well for him. He asks if we can take over treating this. Second he wants to have another A1c. He has been watching a strict diet and he has lost 80 lbs in the past 2 months. He feels better, and his glucoses at home have been in the 110 to 130 range. He has not used any Ozempic for the past 6 weeks.  Virtual Visit via Video Note  I connected with the patient on 08/20/18 at  1:00 PM EDT by a video enabled telemedicine application and verified that I am speaking with the correct person using two identifiers.  Location patient: home Location provider:work or home office Persons participating in the virtual visit: patient, provider  I discussed the limitations of evaluation and management by telemedicine and the availability of in person appointments. The patient expressed understanding and agreed to proceed.   HPI:    ROS: See pertinent positives and negatives per HPI.  Past Medical History:  Diagnosis Date  . Asthma   . Binge eating disorder   . Diabetes mellitus   . GERD (gastroesophageal reflux disease)   . Gout   . Hyperlipidemia   . Hypertension   . Low magnesium level   . Vitamin D deficiency     History reviewed. No pertinent surgical history.  Family History  Problem Relation Age of Onset  . Breast cancer Mother      Current Outpatient Medications:  .  albuterol (PROAIR HFA) 108 (90 BASE) MCG/ACT inhaler, Inhale 2 puffs into the lungs every 4 (four) hours as needed for wheezing., Disp: 1 Inhaler, Rfl: 5 .  aspirin 81 MG tablet, Take 81 mg by mouth daily. , Disp: , Rfl:  .  carvedilol (COREG) 12.5 MG tablet, TAKE 1 TABLET (12.5 MG TOTAL) BY MOUTH 2 (TWO) TIMES DAILY WITH A MEAL., Disp: 180 tablet,  Rfl: 5 .  esomeprazole (NEXIUM) 20 MG packet, Take 20 mg by mouth daily before breakfast., Disp: , Rfl:  .  glucose blood (ONETOUCH VERIO) test strip, 1 each by Other route 4 (four) times daily. And lancets 4/day, Disp: 400 each, Rfl: 3 .  magnesium oxide (MAG-OX) 400 MG tablet, Take 400 mg by mouth daily., Disp: , Rfl:  .  metFORMIN (GLUCOPHAGE-XR) 500 MG 24 hr tablet, TAKE 2 TABLETS BY MOUTH DAILY WITH BREAKFAST, Disp: 180 tablet, Rfl: 3 .  rosuvastatin (CRESTOR) 20 MG tablet, Take 1 tablet (20 mg total) by mouth daily., Disp: 90 tablet, Rfl: 3 .  Semaglutide,0.25 or 0.5MG /DOS, (OZEMPIC, 0.25 OR 0.5 MG/DOSE,) 2 MG/1.5ML SOPN, Inject 1 applicator into the skin once a week., Disp: , Rfl:  .  sildenafil (REVATIO) 20 MG tablet, TAKE 5 TABLETS BY MOUTH ONCE AS NEEDED, Disp: 50 tablet, Rfl: 10 .  temazepam (RESTORIL) 30 MG capsule, Take 1 capsule (30 mg total) by mouth at bedtime as needed for sleep., Disp: 30 capsule, Rfl: 1 .  valsartan-hydrochlorothiazide (DIOVAN-HCT) 320-25 MG tablet, Take 1 tablet by mouth daily., Disp: 90 tablet, Rfl: 3 .  [START ON 10/20/2018] lisdexamfetamine (VYVANSE) 70 MG capsule, Take 1 capsule (70 mg total) by mouth daily for 30 days., Disp: 30 capsule, Rfl: 0  EXAM:  VITALS per  patient if applicable:  GENERAL: alert, oriented, appears well and in no acute distress  HEENT: atraumatic, conjunttiva clear, no obvious abnormalities on inspection of external nose and ears  NECK: normal movements of the head and neck  LUNGS: on inspection no signs of respiratory distress, breathing rate appears normal, no obvious gross SOB, gasping or wheezing  CV: no obvious cyanosis  MS: moves all visible extremities without noticeable abnormality  PSYCH/NEURO: pleasant and cooperative, no obvious depression or anxiety, speech and thought processing grossly intact  ASSESSMENT AND PLAN: ADHD is stable, and we will refill the Vyvanse. For the diabetes he will come by this week for a  BMET and A1c draw.  Gershon CraneStephen Fry, MD  Discussed the following assessment and plan:  Type 2 diabetes mellitus with hyperglycemia, without long-term current use of insulin (HCC) - Plan: Basic metabolic panel, Hemoglobin A1c  ADHD (attention deficit hyperactivity disorder), inattentive type     I discussed the assessment and treatment plan with the patient. The patient was provided an opportunity to ask questions and all were answered. The patient agreed with the plan and demonstrated an understanding of the instructions.   The patient was advised to call back or seek an in-person evaluation if the symptoms worsen or if the condition fails to improve as anticipated.    Review of Systems     Objective:   Physical Exam        Assessment & Plan:

## 2018-09-27 ENCOUNTER — Other Ambulatory Visit: Payer: Self-pay | Admitting: Family Medicine

## 2018-10-01 NOTE — Telephone Encounter (Signed)
Dr. Fry please advise on refill of med   Thanks  

## 2018-10-01 NOTE — Telephone Encounter (Signed)
Call in #[phone] with 5 rf 

## 2018-12-18 ENCOUNTER — Other Ambulatory Visit: Payer: Self-pay | Admitting: Family Medicine

## 2018-12-20 ENCOUNTER — Other Ambulatory Visit: Payer: Self-pay | Admitting: Family Medicine

## 2018-12-20 NOTE — Telephone Encounter (Signed)
Medication Refill - Medication: lisdexamfetamine (VYVANSE) 70 MG capsule   Has the patient contacted their pharmacy? Yes.   (Agent: If no, request that the patient contact the pharmacy for the refill.) (Agent: If yes, when and what did the pharmacy advise?)  Preferred Pharmacy (with phone number or street name): [place], [place] [address]  Agent: Please be advised that RX refills may take up to 3 business days. We ask that you follow-up with your pharmacy.

## 2018-12-20 NOTE — Telephone Encounter (Signed)
Requested medication (s) are due for refill today: yes  Requested medication (s) are on the active medication list: yes  Last refill:  10/20/2018  Future visit scheduled: no  Notes to clinic:  Review for refill   Requested Prescriptions  Pending Prescriptions Disp Refills   lisdexamfetamine (VYVANSE) 70 MG capsule 30 capsule 0    Sig: Take 1 capsule (70 mg total) by mouth daily.     Not Delegated - Psychiatry:  Stimulants/ADHD Failed - 12/20/2018  2:10 PM      Failed - This refill cannot be delegated      Failed - Urine Drug Screen completed in last 360 days.      Failed - Valid encounter within last 3 months    Recent Outpatient Visits          4 months ago Type 2 diabetes mellitus with hyperglycemia, without long-term current use of insulin ([place])   [facility] [place], MD   5 months ago Insomnia, unspecified type   [facility], Ishmael Holter, MD   7 months ago Preventative health care   [facility] [place], Ishmael Holter, MD   2 years ago Preventative health care   [facility] [place], Ishmael Holter, MD   2 years ago [place] [facility] [place], Ishmael Holter, MD

## 2018-12-20 NOTE — Telephone Encounter (Signed)
Patient wife is calling to request that medication be filled today. They are going out of town. Please advise CB- [phone] Preferred [place], [place]

## 2018-12-24 MED ORDER — LISDEXAMFETAMINE DIMESYLATE 70 MG PO CAPS: 70.0000 mg | ORAL_CAPSULE | Freq: Every day | ORAL | 0 refills | Status: DC

## 2018-12-24 NOTE — Telephone Encounter (Signed)
Okay for refill?  

## 2018-12-24 NOTE — Telephone Encounter (Signed)
Done

## 2019-04-14 ENCOUNTER — Other Ambulatory Visit: Payer: Self-pay | Admitting: Family Medicine

## 2019-04-28 ENCOUNTER — Telehealth: Payer: Self-pay | Admitting: Family Medicine

## 2019-04-28 NOTE — Telephone Encounter (Addendum)
Temazepam last filled 04/15/2019 and is not due for refills.  Vyvanse last filled 02/23/2019 Last OV 08/20/2018  Ok to fill?

## 2019-04-28 NOTE — Telephone Encounter (Signed)
Pt requesting an RX refill   temazepam (RESTORIL) 30 MG capsule and Vyvanse per pt wife  Delray Alt said the pharmacy has been sending a request  need to speak to CMA on how to call this in would like a call today  [facility] - [place], [place] - [address]  Phone:  [phone] Fax:  [phone]  contact  [phone] Margie pt wife

## 2019-04-29 MED ORDER — LISDEXAMFETAMINE DIMESYLATE 70 MG PO CAPS: 70.0000 mg | ORAL_CAPSULE | Freq: Every day | ORAL | 0 refills | Status: DC

## 2019-04-29 NOTE — Telephone Encounter (Signed)
I sent in the Vyvanse. Temazepam already has refills

## 2019-05-04 ENCOUNTER — Other Ambulatory Visit: Payer: Self-pay | Admitting: Family Medicine

## 2019-05-29 ENCOUNTER — Telehealth: Payer: Self-pay | Admitting: Family Medicine

## 2019-05-29 MED ORDER — TEMAZEPAM 30 MG PO CAPS: 30.0000 mg | ORAL_CAPSULE | Freq: Every evening | ORAL | 5 refills | Status: DC | PRN

## 2019-05-29 NOTE — Telephone Encounter (Signed)
Pt called in to inform pharmacy is telling him his prescription for Temazepam has not been sent in. He would like assistance with getting his medication.   [name].

## 2019-05-29 NOTE — Telephone Encounter (Signed)
Patient is aware 

## 2019-05-29 NOTE — Telephone Encounter (Signed)
Done

## 2019-06-21 ENCOUNTER — Other Ambulatory Visit: Payer: Self-pay | Admitting: Family Medicine

## 2019-07-08 ENCOUNTER — Other Ambulatory Visit: Payer: Self-pay | Admitting: Family Medicine

## 2019-07-09 ENCOUNTER — Other Ambulatory Visit: Payer: Self-pay | Admitting: Family Medicine

## 2019-07-10 DIAGNOSIS — K219 Gastro-esophageal reflux disease without esophagitis: Secondary | ICD-10-CM | POA: Insufficient documentation

## 2019-10-31 ENCOUNTER — Other Ambulatory Visit: Payer: Self-pay | Admitting: Family Medicine

## 2019-11-10 ENCOUNTER — Other Ambulatory Visit: Payer: Self-pay | Admitting: Family Medicine

## 2019-12-07 ENCOUNTER — Other Ambulatory Visit: Payer: Self-pay | Admitting: Family Medicine

## 2020-02-22 ENCOUNTER — Other Ambulatory Visit: Payer: Self-pay | Admitting: Family Medicine

## 2020-05-04 ENCOUNTER — Other Ambulatory Visit: Payer: Self-pay | Admitting: Family Medicine

## 2020-12-14 DIAGNOSIS — K760 Fatty (change of) liver, not elsewhere classified: Secondary | ICD-10-CM | POA: Insufficient documentation

## 2021-01-28 DIAGNOSIS — F4320 Adjustment disorder, unspecified: Secondary | ICD-10-CM | POA: Diagnosis not present

## 2021-02-03 DIAGNOSIS — F4320 Adjustment disorder, unspecified: Secondary | ICD-10-CM | POA: Diagnosis not present

## 2021-02-04 DIAGNOSIS — F4320 Adjustment disorder, unspecified: Secondary | ICD-10-CM | POA: Diagnosis not present

## 2021-02-07 DIAGNOSIS — F4320 Adjustment disorder, unspecified: Secondary | ICD-10-CM | POA: Diagnosis not present

## 2021-02-09 DIAGNOSIS — F4320 Adjustment disorder, unspecified: Secondary | ICD-10-CM | POA: Diagnosis not present

## 2021-02-10 DIAGNOSIS — F4320 Adjustment disorder, unspecified: Secondary | ICD-10-CM | POA: Diagnosis not present

## 2021-02-14 DIAGNOSIS — F4320 Adjustment disorder, unspecified: Secondary | ICD-10-CM | POA: Diagnosis not present

## 2021-02-15 DIAGNOSIS — F4320 Adjustment disorder, unspecified: Secondary | ICD-10-CM | POA: Diagnosis not present

## 2021-02-16 DIAGNOSIS — F4320 Adjustment disorder, unspecified: Secondary | ICD-10-CM | POA: Diagnosis not present

## 2021-02-21 DIAGNOSIS — F4320 Adjustment disorder, unspecified: Secondary | ICD-10-CM | POA: Diagnosis not present

## 2021-02-24 DIAGNOSIS — F4320 Adjustment disorder, unspecified: Secondary | ICD-10-CM | POA: Diagnosis not present

## 2021-02-25 DIAGNOSIS — F5081 Binge eating disorder: Secondary | ICD-10-CM | POA: Diagnosis not present

## 2021-02-25 DIAGNOSIS — F900 Attention-deficit hyperactivity disorder, predominantly inattentive type: Secondary | ICD-10-CM | POA: Diagnosis not present

## 2021-02-25 DIAGNOSIS — F4320 Adjustment disorder, unspecified: Secondary | ICD-10-CM | POA: Diagnosis not present

## 2021-02-25 DIAGNOSIS — Z23 Encounter for immunization: Secondary | ICD-10-CM | POA: Diagnosis not present

## 2021-02-28 DIAGNOSIS — F4320 Adjustment disorder, unspecified: Secondary | ICD-10-CM | POA: Diagnosis not present

## 2021-03-03 DIAGNOSIS — F4320 Adjustment disorder, unspecified: Secondary | ICD-10-CM | POA: Diagnosis not present

## 2021-03-04 DIAGNOSIS — F4320 Adjustment disorder, unspecified: Secondary | ICD-10-CM | POA: Diagnosis not present

## 2021-03-07 DIAGNOSIS — F4320 Adjustment disorder, unspecified: Secondary | ICD-10-CM | POA: Diagnosis not present

## 2021-03-10 DIAGNOSIS — F4320 Adjustment disorder, unspecified: Secondary | ICD-10-CM | POA: Diagnosis not present

## 2021-03-11 DIAGNOSIS — F4320 Adjustment disorder, unspecified: Secondary | ICD-10-CM | POA: Diagnosis not present

## 2021-03-14 DIAGNOSIS — F4320 Adjustment disorder, unspecified: Secondary | ICD-10-CM | POA: Diagnosis not present

## 2021-03-15 DIAGNOSIS — E119 Type 2 diabetes mellitus without complications: Secondary | ICD-10-CM | POA: Diagnosis not present

## 2021-03-15 DIAGNOSIS — L600 Ingrowing nail: Secondary | ICD-10-CM | POA: Diagnosis not present

## 2021-03-15 DIAGNOSIS — I10 Essential (primary) hypertension: Secondary | ICD-10-CM | POA: Diagnosis not present

## 2021-03-16 DIAGNOSIS — F4320 Adjustment disorder, unspecified: Secondary | ICD-10-CM | POA: Diagnosis not present

## 2021-03-17 DIAGNOSIS — F4320 Adjustment disorder, unspecified: Secondary | ICD-10-CM | POA: Diagnosis not present

## 2021-03-21 DIAGNOSIS — F4320 Adjustment disorder, unspecified: Secondary | ICD-10-CM | POA: Diagnosis not present

## 2021-03-29 DIAGNOSIS — F4320 Adjustment disorder, unspecified: Secondary | ICD-10-CM | POA: Diagnosis not present

## 2021-03-30 DIAGNOSIS — F4320 Adjustment disorder, unspecified: Secondary | ICD-10-CM | POA: Diagnosis not present

## 2021-03-31 DIAGNOSIS — F4320 Adjustment disorder, unspecified: Secondary | ICD-10-CM | POA: Diagnosis not present

## 2021-04-04 DIAGNOSIS — J019 Acute sinusitis, unspecified: Secondary | ICD-10-CM | POA: Diagnosis not present

## 2021-04-04 DIAGNOSIS — F4320 Adjustment disorder, unspecified: Secondary | ICD-10-CM | POA: Diagnosis not present

## 2021-04-06 DIAGNOSIS — R062 Wheezing: Secondary | ICD-10-CM | POA: Diagnosis not present

## 2021-04-06 DIAGNOSIS — R058 Other specified cough: Secondary | ICD-10-CM | POA: Diagnosis not present

## 2021-04-06 DIAGNOSIS — J029 Acute pharyngitis, unspecified: Secondary | ICD-10-CM | POA: Diagnosis not present

## 2021-04-07 DIAGNOSIS — F4320 Adjustment disorder, unspecified: Secondary | ICD-10-CM | POA: Diagnosis not present

## 2021-04-08 DIAGNOSIS — F4320 Adjustment disorder, unspecified: Secondary | ICD-10-CM | POA: Diagnosis not present

## 2021-04-11 DIAGNOSIS — R062 Wheezing: Secondary | ICD-10-CM | POA: Diagnosis not present

## 2021-04-11 DIAGNOSIS — I10 Essential (primary) hypertension: Secondary | ICD-10-CM | POA: Diagnosis not present

## 2021-04-11 DIAGNOSIS — R058 Other specified cough: Secondary | ICD-10-CM | POA: Diagnosis not present

## 2021-04-12 DIAGNOSIS — F4320 Adjustment disorder, unspecified: Secondary | ICD-10-CM | POA: Diagnosis not present

## 2021-04-12 DIAGNOSIS — E1169 Type 2 diabetes mellitus with other specified complication: Secondary | ICD-10-CM | POA: Diagnosis not present

## 2021-04-12 DIAGNOSIS — E1165 Type 2 diabetes mellitus with hyperglycemia: Secondary | ICD-10-CM | POA: Diagnosis not present

## 2021-04-12 DIAGNOSIS — I10 Essential (primary) hypertension: Secondary | ICD-10-CM | POA: Diagnosis not present

## 2021-04-12 DIAGNOSIS — E785 Hyperlipidemia, unspecified: Secondary | ICD-10-CM | POA: Diagnosis not present

## 2021-04-12 DIAGNOSIS — E669 Obesity, unspecified: Secondary | ICD-10-CM | POA: Diagnosis not present

## 2021-04-14 DIAGNOSIS — F4320 Adjustment disorder, unspecified: Secondary | ICD-10-CM | POA: Diagnosis not present

## 2021-04-14 DIAGNOSIS — Z23 Encounter for immunization: Secondary | ICD-10-CM | POA: Diagnosis not present

## 2021-04-15 DIAGNOSIS — F4320 Adjustment disorder, unspecified: Secondary | ICD-10-CM | POA: Diagnosis not present

## 2021-04-18 DIAGNOSIS — F4320 Adjustment disorder, unspecified: Secondary | ICD-10-CM | POA: Diagnosis not present

## 2021-04-21 DIAGNOSIS — F4320 Adjustment disorder, unspecified: Secondary | ICD-10-CM | POA: Diagnosis not present

## 2021-04-22 DIAGNOSIS — F4320 Adjustment disorder, unspecified: Secondary | ICD-10-CM | POA: Diagnosis not present

## 2021-04-22 DIAGNOSIS — I10 Essential (primary) hypertension: Secondary | ICD-10-CM | POA: Diagnosis not present

## 2021-04-22 DIAGNOSIS — E1165 Type 2 diabetes mellitus with hyperglycemia: Secondary | ICD-10-CM | POA: Diagnosis not present

## 2021-04-25 DIAGNOSIS — F4320 Adjustment disorder, unspecified: Secondary | ICD-10-CM | POA: Diagnosis not present

## 2021-04-29 DIAGNOSIS — F4320 Adjustment disorder, unspecified: Secondary | ICD-10-CM | POA: Diagnosis not present

## 2021-05-02 DIAGNOSIS — F4320 Adjustment disorder, unspecified: Secondary | ICD-10-CM | POA: Diagnosis not present

## 2021-05-04 DIAGNOSIS — F4320 Adjustment disorder, unspecified: Secondary | ICD-10-CM | POA: Diagnosis not present

## 2021-05-09 DIAGNOSIS — F4320 Adjustment disorder, unspecified: Secondary | ICD-10-CM | POA: Diagnosis not present

## 2021-05-10 DIAGNOSIS — F4320 Adjustment disorder, unspecified: Secondary | ICD-10-CM | POA: Diagnosis not present

## 2021-05-11 DIAGNOSIS — F4320 Adjustment disorder, unspecified: Secondary | ICD-10-CM | POA: Diagnosis not present

## 2021-05-16 DIAGNOSIS — Z1211 Encounter for screening for malignant neoplasm of colon: Secondary | ICD-10-CM | POA: Diagnosis not present

## 2021-05-16 DIAGNOSIS — K635 Polyp of colon: Secondary | ICD-10-CM | POA: Diagnosis not present

## 2021-05-17 DIAGNOSIS — F4320 Adjustment disorder, unspecified: Secondary | ICD-10-CM | POA: Diagnosis not present

## 2021-05-19 DIAGNOSIS — F4320 Adjustment disorder, unspecified: Secondary | ICD-10-CM | POA: Diagnosis not present

## 2021-05-20 DIAGNOSIS — F4320 Adjustment disorder, unspecified: Secondary | ICD-10-CM | POA: Diagnosis not present

## 2021-05-23 DIAGNOSIS — F4320 Adjustment disorder, unspecified: Secondary | ICD-10-CM | POA: Diagnosis not present

## 2021-05-24 DIAGNOSIS — F4320 Adjustment disorder, unspecified: Secondary | ICD-10-CM | POA: Diagnosis not present

## 2021-05-26 DIAGNOSIS — F4320 Adjustment disorder, unspecified: Secondary | ICD-10-CM | POA: Diagnosis not present

## 2021-05-30 DIAGNOSIS — F4320 Adjustment disorder, unspecified: Secondary | ICD-10-CM | POA: Diagnosis not present

## 2021-06-02 DIAGNOSIS — F4320 Adjustment disorder, unspecified: Secondary | ICD-10-CM | POA: Diagnosis not present

## 2021-06-03 DIAGNOSIS — F4320 Adjustment disorder, unspecified: Secondary | ICD-10-CM | POA: Diagnosis not present

## 2021-06-06 DIAGNOSIS — F4320 Adjustment disorder, unspecified: Secondary | ICD-10-CM | POA: Diagnosis not present

## 2021-06-09 DIAGNOSIS — F4320 Adjustment disorder, unspecified: Secondary | ICD-10-CM | POA: Diagnosis not present

## 2021-06-10 DIAGNOSIS — F4320 Adjustment disorder, unspecified: Secondary | ICD-10-CM | POA: Diagnosis not present

## 2021-06-14 DIAGNOSIS — F4320 Adjustment disorder, unspecified: Secondary | ICD-10-CM | POA: Diagnosis not present

## 2021-06-16 DIAGNOSIS — F4320 Adjustment disorder, unspecified: Secondary | ICD-10-CM | POA: Diagnosis not present

## 2021-06-17 DIAGNOSIS — F4320 Adjustment disorder, unspecified: Secondary | ICD-10-CM | POA: Diagnosis not present

## 2021-06-23 DIAGNOSIS — F4320 Adjustment disorder, unspecified: Secondary | ICD-10-CM | POA: Diagnosis not present

## 2021-06-24 DIAGNOSIS — F4320 Adjustment disorder, unspecified: Secondary | ICD-10-CM | POA: Diagnosis not present

## 2021-06-30 DIAGNOSIS — F4320 Adjustment disorder, unspecified: Secondary | ICD-10-CM | POA: Diagnosis not present

## 2021-07-01 DIAGNOSIS — F4320 Adjustment disorder, unspecified: Secondary | ICD-10-CM | POA: Diagnosis not present

## 2021-07-05 DIAGNOSIS — F4320 Adjustment disorder, unspecified: Secondary | ICD-10-CM | POA: Diagnosis not present

## 2021-07-07 DIAGNOSIS — F4320 Adjustment disorder, unspecified: Secondary | ICD-10-CM | POA: Diagnosis not present

## 2021-07-14 DIAGNOSIS — F4320 Adjustment disorder, unspecified: Secondary | ICD-10-CM | POA: Diagnosis not present

## 2021-07-15 DIAGNOSIS — F4320 Adjustment disorder, unspecified: Secondary | ICD-10-CM | POA: Diagnosis not present

## 2021-07-21 DIAGNOSIS — F4320 Adjustment disorder, unspecified: Secondary | ICD-10-CM | POA: Diagnosis not present

## 2021-07-22 DIAGNOSIS — F4320 Adjustment disorder, unspecified: Secondary | ICD-10-CM | POA: Diagnosis not present

## 2021-07-25 DIAGNOSIS — F4320 Adjustment disorder, unspecified: Secondary | ICD-10-CM | POA: Diagnosis not present

## 2021-07-27 DIAGNOSIS — F4320 Adjustment disorder, unspecified: Secondary | ICD-10-CM | POA: Diagnosis not present

## 2021-07-29 DIAGNOSIS — F4320 Adjustment disorder, unspecified: Secondary | ICD-10-CM | POA: Diagnosis not present

## 2021-08-01 DIAGNOSIS — F4320 Adjustment disorder, unspecified: Secondary | ICD-10-CM | POA: Diagnosis not present

## 2021-08-04 DIAGNOSIS — F4320 Adjustment disorder, unspecified: Secondary | ICD-10-CM | POA: Diagnosis not present

## 2021-08-08 DIAGNOSIS — F4320 Adjustment disorder, unspecified: Secondary | ICD-10-CM | POA: Diagnosis not present

## 2021-08-11 DIAGNOSIS — F4320 Adjustment disorder, unspecified: Secondary | ICD-10-CM | POA: Diagnosis not present

## 2021-08-15 DIAGNOSIS — F4320 Adjustment disorder, unspecified: Secondary | ICD-10-CM | POA: Diagnosis not present

## 2021-08-16 DIAGNOSIS — F4320 Adjustment disorder, unspecified: Secondary | ICD-10-CM | POA: Diagnosis not present

## 2021-08-25 DIAGNOSIS — F4320 Adjustment disorder, unspecified: Secondary | ICD-10-CM | POA: Diagnosis not present

## 2021-08-26 DIAGNOSIS — F4320 Adjustment disorder, unspecified: Secondary | ICD-10-CM | POA: Diagnosis not present

## 2021-09-01 DIAGNOSIS — F4320 Adjustment disorder, unspecified: Secondary | ICD-10-CM | POA: Diagnosis not present

## 2021-09-02 DIAGNOSIS — F4320 Adjustment disorder, unspecified: Secondary | ICD-10-CM | POA: Diagnosis not present

## 2021-09-07 DIAGNOSIS — F4320 Adjustment disorder, unspecified: Secondary | ICD-10-CM | POA: Diagnosis not present

## 2021-09-08 DIAGNOSIS — F4320 Adjustment disorder, unspecified: Secondary | ICD-10-CM | POA: Diagnosis not present

## 2021-09-09 DIAGNOSIS — F4320 Adjustment disorder, unspecified: Secondary | ICD-10-CM | POA: Diagnosis not present

## 2021-09-12 DIAGNOSIS — J3489 Other specified disorders of nose and nasal sinuses: Secondary | ICD-10-CM | POA: Diagnosis not present

## 2021-09-12 DIAGNOSIS — R058 Other specified cough: Secondary | ICD-10-CM | POA: Diagnosis not present

## 2021-09-12 DIAGNOSIS — I10 Essential (primary) hypertension: Secondary | ICD-10-CM | POA: Diagnosis not present

## 2021-09-12 DIAGNOSIS — R0981 Nasal congestion: Secondary | ICD-10-CM | POA: Diagnosis not present

## 2021-09-12 DIAGNOSIS — R0989 Other specified symptoms and signs involving the circulatory and respiratory systems: Secondary | ICD-10-CM | POA: Diagnosis not present

## 2021-09-15 DIAGNOSIS — F4320 Adjustment disorder, unspecified: Secondary | ICD-10-CM | POA: Diagnosis not present

## 2021-09-16 DIAGNOSIS — E1169 Type 2 diabetes mellitus with other specified complication: Secondary | ICD-10-CM | POA: Diagnosis not present

## 2021-09-16 DIAGNOSIS — E1165 Type 2 diabetes mellitus with hyperglycemia: Secondary | ICD-10-CM | POA: Diagnosis not present

## 2021-09-16 DIAGNOSIS — I10 Essential (primary) hypertension: Secondary | ICD-10-CM | POA: Diagnosis not present

## 2021-09-16 DIAGNOSIS — F900 Attention-deficit hyperactivity disorder, predominantly inattentive type: Secondary | ICD-10-CM | POA: Diagnosis not present

## 2021-09-16 DIAGNOSIS — E785 Hyperlipidemia, unspecified: Secondary | ICD-10-CM | POA: Diagnosis not present

## 2021-09-16 DIAGNOSIS — F4320 Adjustment disorder, unspecified: Secondary | ICD-10-CM | POA: Diagnosis not present

## 2021-09-19 DIAGNOSIS — F4320 Adjustment disorder, unspecified: Secondary | ICD-10-CM | POA: Diagnosis not present

## 2021-09-20 DIAGNOSIS — F4320 Adjustment disorder, unspecified: Secondary | ICD-10-CM | POA: Diagnosis not present

## 2021-09-21 DIAGNOSIS — I10 Essential (primary) hypertension: Secondary | ICD-10-CM | POA: Diagnosis not present

## 2021-09-21 DIAGNOSIS — J019 Acute sinusitis, unspecified: Secondary | ICD-10-CM | POA: Diagnosis not present

## 2021-09-21 DIAGNOSIS — H6090 Unspecified otitis externa, unspecified ear: Secondary | ICD-10-CM | POA: Diagnosis not present

## 2021-10-04 DIAGNOSIS — M7731 Calcaneal spur, right foot: Secondary | ICD-10-CM | POA: Diagnosis not present

## 2021-10-04 DIAGNOSIS — M25571 Pain in right ankle and joints of right foot: Secondary | ICD-10-CM | POA: Diagnosis not present

## 2021-10-04 DIAGNOSIS — M79671 Pain in right foot: Secondary | ICD-10-CM | POA: Diagnosis not present

## 2021-10-04 DIAGNOSIS — I10 Essential (primary) hypertension: Secondary | ICD-10-CM | POA: Diagnosis not present

## 2021-10-05 DIAGNOSIS — F4320 Adjustment disorder, unspecified: Secondary | ICD-10-CM | POA: Diagnosis not present

## 2021-10-07 DIAGNOSIS — F4320 Adjustment disorder, unspecified: Secondary | ICD-10-CM | POA: Diagnosis not present

## 2021-10-10 DIAGNOSIS — F4320 Adjustment disorder, unspecified: Secondary | ICD-10-CM | POA: Diagnosis not present

## 2021-10-12 ENCOUNTER — Encounter: Payer: Self-pay | Admitting: Orthopaedic Surgery

## 2021-10-12 DIAGNOSIS — F4320 Adjustment disorder, unspecified: Secondary | ICD-10-CM | POA: Diagnosis not present

## 2021-10-13 DIAGNOSIS — F4320 Adjustment disorder, unspecified: Secondary | ICD-10-CM | POA: Diagnosis not present

## 2021-10-19 ENCOUNTER — Ambulatory Visit (INDEPENDENT_AMBULATORY_CARE_PROVIDER_SITE_OTHER): Payer: BC Managed Care – PPO

## 2021-10-19 ENCOUNTER — Ambulatory Visit: Payer: BC Managed Care – PPO | Admitting: Orthopaedic Surgery

## 2021-10-19 ENCOUNTER — Encounter: Payer: Self-pay | Admitting: Orthopaedic Surgery

## 2021-10-19 DIAGNOSIS — M79671 Pain in right foot: Secondary | ICD-10-CM | POA: Diagnosis not present

## 2021-10-19 NOTE — Progress Notes (Signed)
[name] is well-known to me.  He is actually good for him from going up all through elementary school and high school.  He is 56 years old and very active.  About a month ago he was running from a wasps nest and rolled his ankle quite significantly on the right side.  He had significant pain and swelling as well as bruising and was able to show me photographs of his foot and ankle at that point.  He went to his primary care physician and x-rays were negative for fracture.  However he was not putting any type of boot or brace.  He says he is doing better right now and originally cannot put weight on his foot and ankle but that has improved.  Examination of his right ankle still shows swelling laterally and pain over the anterior talofibular ligament and just inferior to the fibula.  There is no medial pain at all.  His ankle feels ligamentously stable but is painful.  3 views of his right foot were ordered and obtained and showed no fracture.  There are cortical irregularities showing old injuries but the subtalar joint and the ankle joint that can be seen on these films show good alignment.  I would like to try an ASO for support of his right ankle.  I would like to see him back in 4 weeks to make sure he is doing well and I would like a single mortise view of his right ankle at that visit.  All question concerns were answered and addressed.

## 2021-10-20 DIAGNOSIS — F4320 Adjustment disorder, unspecified: Secondary | ICD-10-CM | POA: Diagnosis not present

## 2021-10-21 DIAGNOSIS — Z23 Encounter for immunization: Secondary | ICD-10-CM | POA: Diagnosis not present

## 2021-10-21 DIAGNOSIS — F4320 Adjustment disorder, unspecified: Secondary | ICD-10-CM | POA: Diagnosis not present

## 2021-10-21 DIAGNOSIS — E1165 Type 2 diabetes mellitus with hyperglycemia: Secondary | ICD-10-CM | POA: Diagnosis not present

## 2021-10-21 DIAGNOSIS — S93401D Sprain of unspecified ligament of right ankle, subsequent encounter: Secondary | ICD-10-CM | POA: Diagnosis not present

## 2021-10-21 DIAGNOSIS — I10 Essential (primary) hypertension: Secondary | ICD-10-CM | POA: Diagnosis not present

## 2021-10-24 DIAGNOSIS — F4320 Adjustment disorder, unspecified: Secondary | ICD-10-CM | POA: Diagnosis not present

## 2021-10-27 DIAGNOSIS — F4320 Adjustment disorder, unspecified: Secondary | ICD-10-CM | POA: Diagnosis not present

## 2021-10-28 DIAGNOSIS — F4320 Adjustment disorder, unspecified: Secondary | ICD-10-CM | POA: Diagnosis not present

## 2021-10-31 DIAGNOSIS — F4320 Adjustment disorder, unspecified: Secondary | ICD-10-CM | POA: Diagnosis not present

## 2021-11-02 DIAGNOSIS — F4320 Adjustment disorder, unspecified: Secondary | ICD-10-CM | POA: Diagnosis not present

## 2021-11-03 DIAGNOSIS — F4320 Adjustment disorder, unspecified: Secondary | ICD-10-CM | POA: Diagnosis not present

## 2021-11-07 DIAGNOSIS — F4320 Adjustment disorder, unspecified: Secondary | ICD-10-CM | POA: Diagnosis not present

## 2021-11-17 ENCOUNTER — Ambulatory Visit: Payer: BC Managed Care – PPO | Admitting: Orthopaedic Surgery

## 2021-11-17 DIAGNOSIS — F4320 Adjustment disorder, unspecified: Secondary | ICD-10-CM | POA: Diagnosis not present

## 2021-11-18 DIAGNOSIS — F4320 Adjustment disorder, unspecified: Secondary | ICD-10-CM | POA: Diagnosis not present

## 2021-11-21 DIAGNOSIS — E1165 Type 2 diabetes mellitus with hyperglycemia: Secondary | ICD-10-CM | POA: Diagnosis not present

## 2021-11-21 DIAGNOSIS — Z791 Long term (current) use of non-steroidal anti-inflammatories (NSAID): Secondary | ICD-10-CM | POA: Diagnosis not present

## 2021-11-21 DIAGNOSIS — I10 Essential (primary) hypertension: Secondary | ICD-10-CM | POA: Diagnosis not present

## 2021-11-21 DIAGNOSIS — Z23 Encounter for immunization: Secondary | ICD-10-CM | POA: Diagnosis not present

## 2021-11-24 DIAGNOSIS — F4320 Adjustment disorder, unspecified: Secondary | ICD-10-CM | POA: Diagnosis not present

## 2021-11-25 DIAGNOSIS — F4320 Adjustment disorder, unspecified: Secondary | ICD-10-CM | POA: Diagnosis not present

## 2021-12-01 DIAGNOSIS — F4320 Adjustment disorder, unspecified: Secondary | ICD-10-CM | POA: Diagnosis not present

## 2021-12-02 DIAGNOSIS — F4320 Adjustment disorder, unspecified: Secondary | ICD-10-CM | POA: Diagnosis not present

## 2021-12-08 DIAGNOSIS — F4320 Adjustment disorder, unspecified: Secondary | ICD-10-CM | POA: Diagnosis not present

## 2021-12-09 DIAGNOSIS — F4320 Adjustment disorder, unspecified: Secondary | ICD-10-CM | POA: Diagnosis not present

## 2021-12-15 DIAGNOSIS — F4320 Adjustment disorder, unspecified: Secondary | ICD-10-CM | POA: Diagnosis not present

## 2021-12-16 DIAGNOSIS — F4320 Adjustment disorder, unspecified: Secondary | ICD-10-CM | POA: Diagnosis not present

## 2021-12-21 DIAGNOSIS — F4320 Adjustment disorder, unspecified: Secondary | ICD-10-CM | POA: Diagnosis not present

## 2021-12-22 DIAGNOSIS — F4320 Adjustment disorder, unspecified: Secondary | ICD-10-CM | POA: Diagnosis not present

## 2021-12-26 DIAGNOSIS — F4320 Adjustment disorder, unspecified: Secondary | ICD-10-CM | POA: Diagnosis not present

## 2021-12-29 DIAGNOSIS — F4320 Adjustment disorder, unspecified: Secondary | ICD-10-CM | POA: Diagnosis not present

## 2022-01-05 DIAGNOSIS — F4320 Adjustment disorder, unspecified: Secondary | ICD-10-CM | POA: Diagnosis not present

## 2022-01-06 DIAGNOSIS — F4320 Adjustment disorder, unspecified: Secondary | ICD-10-CM | POA: Diagnosis not present

## 2022-01-12 DIAGNOSIS — F4320 Adjustment disorder, unspecified: Secondary | ICD-10-CM | POA: Diagnosis not present

## 2022-01-13 DIAGNOSIS — F4320 Adjustment disorder, unspecified: Secondary | ICD-10-CM | POA: Diagnosis not present

## 2022-01-16 DIAGNOSIS — F4320 Adjustment disorder, unspecified: Secondary | ICD-10-CM | POA: Diagnosis not present

## 2022-01-20 DIAGNOSIS — M62838 Other muscle spasm: Secondary | ICD-10-CM | POA: Diagnosis not present

## 2022-01-20 DIAGNOSIS — L309 Dermatitis, unspecified: Secondary | ICD-10-CM | POA: Diagnosis not present

## 2022-01-20 DIAGNOSIS — Z23 Encounter for immunization: Secondary | ICD-10-CM | POA: Diagnosis not present

## 2022-01-20 DIAGNOSIS — M25511 Pain in right shoulder: Secondary | ICD-10-CM | POA: Diagnosis not present

## 2022-01-20 DIAGNOSIS — I10 Essential (primary) hypertension: Secondary | ICD-10-CM | POA: Diagnosis not present

## 2022-01-27 DIAGNOSIS — F4320 Adjustment disorder, unspecified: Secondary | ICD-10-CM | POA: Diagnosis not present

## 2022-01-31 DIAGNOSIS — Z125 Encounter for screening for malignant neoplasm of prostate: Secondary | ICD-10-CM | POA: Diagnosis not present

## 2022-01-31 DIAGNOSIS — E1165 Type 2 diabetes mellitus with hyperglycemia: Secondary | ICD-10-CM | POA: Diagnosis not present

## 2022-01-31 DIAGNOSIS — I10 Essential (primary) hypertension: Secondary | ICD-10-CM | POA: Diagnosis not present

## 2022-01-31 DIAGNOSIS — Z0000 Encounter for general adult medical examination without abnormal findings: Secondary | ICD-10-CM | POA: Diagnosis not present

## 2022-01-31 DIAGNOSIS — E785 Hyperlipidemia, unspecified: Secondary | ICD-10-CM | POA: Diagnosis not present

## 2022-01-31 DIAGNOSIS — Z23 Encounter for immunization: Secondary | ICD-10-CM | POA: Diagnosis not present

## 2022-01-31 DIAGNOSIS — E1169 Type 2 diabetes mellitus with other specified complication: Secondary | ICD-10-CM | POA: Diagnosis not present

## 2022-01-31 DIAGNOSIS — Z1329 Encounter for screening for other suspected endocrine disorder: Secondary | ICD-10-CM | POA: Diagnosis not present

## 2022-02-02 DIAGNOSIS — F4320 Adjustment disorder, unspecified: Secondary | ICD-10-CM | POA: Diagnosis not present

## 2022-02-03 DIAGNOSIS — F4320 Adjustment disorder, unspecified: Secondary | ICD-10-CM | POA: Diagnosis not present

## 2022-02-09 DIAGNOSIS — F4320 Adjustment disorder, unspecified: Secondary | ICD-10-CM | POA: Diagnosis not present

## 2022-02-10 DIAGNOSIS — F4320 Adjustment disorder, unspecified: Secondary | ICD-10-CM | POA: Diagnosis not present

## 2022-02-13 DIAGNOSIS — F4320 Adjustment disorder, unspecified: Secondary | ICD-10-CM | POA: Diagnosis not present

## 2022-02-21 DIAGNOSIS — I10 Essential (primary) hypertension: Secondary | ICD-10-CM | POA: Diagnosis not present

## 2022-02-21 DIAGNOSIS — L03119 Cellulitis of unspecified part of limb: Secondary | ICD-10-CM | POA: Diagnosis not present

## 2022-02-21 DIAGNOSIS — L02619 Cutaneous abscess of unspecified foot: Secondary | ICD-10-CM | POA: Diagnosis not present

## 2022-02-27 DIAGNOSIS — F4320 Adjustment disorder, unspecified: Secondary | ICD-10-CM | POA: Diagnosis not present

## 2022-03-01 DIAGNOSIS — F4320 Adjustment disorder, unspecified: Secondary | ICD-10-CM | POA: Diagnosis not present

## 2022-03-02 DIAGNOSIS — F4320 Adjustment disorder, unspecified: Secondary | ICD-10-CM | POA: Diagnosis not present

## 2022-03-09 DIAGNOSIS — F4320 Adjustment disorder, unspecified: Secondary | ICD-10-CM | POA: Diagnosis not present

## 2022-03-10 DIAGNOSIS — F4320 Adjustment disorder, unspecified: Secondary | ICD-10-CM | POA: Diagnosis not present

## 2022-03-13 DIAGNOSIS — F4320 Adjustment disorder, unspecified: Secondary | ICD-10-CM | POA: Diagnosis not present

## 2022-03-14 DIAGNOSIS — F4320 Adjustment disorder, unspecified: Secondary | ICD-10-CM | POA: Diagnosis not present

## 2022-03-23 DIAGNOSIS — F4320 Adjustment disorder, unspecified: Secondary | ICD-10-CM | POA: Diagnosis not present

## 2022-03-24 DIAGNOSIS — F4320 Adjustment disorder, unspecified: Secondary | ICD-10-CM | POA: Diagnosis not present

## 2022-03-30 DIAGNOSIS — F4320 Adjustment disorder, unspecified: Secondary | ICD-10-CM | POA: Diagnosis not present

## 2022-03-31 DIAGNOSIS — F4320 Adjustment disorder, unspecified: Secondary | ICD-10-CM | POA: Diagnosis not present

## 2022-04-13 DIAGNOSIS — F4320 Adjustment disorder, unspecified: Secondary | ICD-10-CM | POA: Diagnosis not present

## 2022-04-14 DIAGNOSIS — F4320 Adjustment disorder, unspecified: Secondary | ICD-10-CM | POA: Diagnosis not present

## 2022-04-18 DIAGNOSIS — F4320 Adjustment disorder, unspecified: Secondary | ICD-10-CM | POA: Diagnosis not present

## 2022-04-20 DIAGNOSIS — F4320 Adjustment disorder, unspecified: Secondary | ICD-10-CM | POA: Diagnosis not present

## 2022-04-21 DIAGNOSIS — F4320 Adjustment disorder, unspecified: Secondary | ICD-10-CM | POA: Diagnosis not present

## 2022-04-27 DIAGNOSIS — F4320 Adjustment disorder, unspecified: Secondary | ICD-10-CM | POA: Diagnosis not present

## 2022-04-28 DIAGNOSIS — F4320 Adjustment disorder, unspecified: Secondary | ICD-10-CM | POA: Diagnosis not present

## 2022-05-04 DIAGNOSIS — F4320 Adjustment disorder, unspecified: Secondary | ICD-10-CM | POA: Diagnosis not present

## 2022-05-05 DIAGNOSIS — F4320 Adjustment disorder, unspecified: Secondary | ICD-10-CM | POA: Diagnosis not present

## 2022-05-11 DIAGNOSIS — F4320 Adjustment disorder, unspecified: Secondary | ICD-10-CM | POA: Diagnosis not present

## 2022-05-12 DIAGNOSIS — F4320 Adjustment disorder, unspecified: Secondary | ICD-10-CM | POA: Diagnosis not present

## 2022-05-18 DIAGNOSIS — F4320 Adjustment disorder, unspecified: Secondary | ICD-10-CM | POA: Diagnosis not present

## 2022-05-19 DIAGNOSIS — F4320 Adjustment disorder, unspecified: Secondary | ICD-10-CM | POA: Diagnosis not present

## 2022-05-25 DIAGNOSIS — F4320 Adjustment disorder, unspecified: Secondary | ICD-10-CM | POA: Diagnosis not present

## 2022-05-26 ENCOUNTER — Other Ambulatory Visit: Payer: Self-pay | Admitting: Family Medicine

## 2022-05-26 DIAGNOSIS — F4320 Adjustment disorder, unspecified: Secondary | ICD-10-CM | POA: Diagnosis not present

## 2022-05-26 DIAGNOSIS — U071 COVID-19: Secondary | ICD-10-CM

## 2022-05-26 MED ORDER — MOLNUPIRAVIR EUA 200MG CAPSULE: 4.0000 | ORAL_CAPSULE | Freq: Two times a day (BID) | ORAL | 0 refills | Status: AC

## 2022-05-26 NOTE — Progress Notes (Signed)
COVID positive at home. Significant comorbid conditions.

## 2022-06-01 DIAGNOSIS — F4320 Adjustment disorder, unspecified: Secondary | ICD-10-CM | POA: Diagnosis not present

## 2022-06-02 DIAGNOSIS — F4320 Adjustment disorder, unspecified: Secondary | ICD-10-CM | POA: Diagnosis not present

## 2022-06-08 DIAGNOSIS — F4320 Adjustment disorder, unspecified: Secondary | ICD-10-CM | POA: Diagnosis not present

## 2022-06-09 DIAGNOSIS — F4320 Adjustment disorder, unspecified: Secondary | ICD-10-CM | POA: Diagnosis not present

## 2022-06-13 DIAGNOSIS — F4320 Adjustment disorder, unspecified: Secondary | ICD-10-CM | POA: Diagnosis not present

## 2022-06-14 DIAGNOSIS — F4320 Adjustment disorder, unspecified: Secondary | ICD-10-CM | POA: Diagnosis not present

## 2022-06-20 DIAGNOSIS — F4320 Adjustment disorder, unspecified: Secondary | ICD-10-CM | POA: Diagnosis not present

## 2022-06-21 DIAGNOSIS — F4320 Adjustment disorder, unspecified: Secondary | ICD-10-CM | POA: Diagnosis not present

## 2022-06-28 DIAGNOSIS — F4320 Adjustment disorder, unspecified: Secondary | ICD-10-CM | POA: Diagnosis not present

## 2022-06-29 DIAGNOSIS — F4320 Adjustment disorder, unspecified: Secondary | ICD-10-CM | POA: Diagnosis not present

## 2022-06-30 DIAGNOSIS — F4320 Adjustment disorder, unspecified: Secondary | ICD-10-CM | POA: Diagnosis not present

## 2022-07-06 DIAGNOSIS — F4320 Adjustment disorder, unspecified: Secondary | ICD-10-CM | POA: Diagnosis not present

## 2022-07-07 DIAGNOSIS — F4320 Adjustment disorder, unspecified: Secondary | ICD-10-CM | POA: Diagnosis not present

## 2022-07-10 DIAGNOSIS — F4320 Adjustment disorder, unspecified: Secondary | ICD-10-CM | POA: Diagnosis not present

## 2022-07-13 DIAGNOSIS — F4320 Adjustment disorder, unspecified: Secondary | ICD-10-CM | POA: Diagnosis not present

## 2022-07-14 DIAGNOSIS — F4320 Adjustment disorder, unspecified: Secondary | ICD-10-CM | POA: Diagnosis not present

## 2022-07-18 DIAGNOSIS — F4320 Adjustment disorder, unspecified: Secondary | ICD-10-CM | POA: Diagnosis not present

## 2022-07-21 DIAGNOSIS — F4320 Adjustment disorder, unspecified: Secondary | ICD-10-CM | POA: Diagnosis not present

## 2022-07-27 DIAGNOSIS — F4320 Adjustment disorder, unspecified: Secondary | ICD-10-CM | POA: Diagnosis not present

## 2022-07-28 DIAGNOSIS — F4320 Adjustment disorder, unspecified: Secondary | ICD-10-CM | POA: Diagnosis not present

## 2022-08-03 DIAGNOSIS — F4320 Adjustment disorder, unspecified: Secondary | ICD-10-CM | POA: Diagnosis not present

## 2022-08-04 DIAGNOSIS — F4320 Adjustment disorder, unspecified: Secondary | ICD-10-CM | POA: Diagnosis not present

## 2022-08-07 DIAGNOSIS — F4320 Adjustment disorder, unspecified: Secondary | ICD-10-CM | POA: Diagnosis not present

## 2022-08-10 DIAGNOSIS — F4320 Adjustment disorder, unspecified: Secondary | ICD-10-CM | POA: Diagnosis not present

## 2022-08-17 DIAGNOSIS — F4320 Adjustment disorder, unspecified: Secondary | ICD-10-CM | POA: Diagnosis not present

## 2022-08-18 DIAGNOSIS — F4320 Adjustment disorder, unspecified: Secondary | ICD-10-CM | POA: Diagnosis not present

## 2022-08-23 DIAGNOSIS — F4320 Adjustment disorder, unspecified: Secondary | ICD-10-CM | POA: Diagnosis not present

## 2022-08-24 DIAGNOSIS — F4320 Adjustment disorder, unspecified: Secondary | ICD-10-CM | POA: Diagnosis not present

## 2022-08-25 DIAGNOSIS — F4320 Adjustment disorder, unspecified: Secondary | ICD-10-CM | POA: Diagnosis not present

## 2022-08-28 DIAGNOSIS — F4320 Adjustment disorder, unspecified: Secondary | ICD-10-CM | POA: Diagnosis not present

## 2022-08-29 DIAGNOSIS — F4320 Adjustment disorder, unspecified: Secondary | ICD-10-CM | POA: Diagnosis not present

## 2022-09-05 ENCOUNTER — Encounter: Payer: Self-pay | Admitting: Family Medicine

## 2022-09-05 ENCOUNTER — Ambulatory Visit: Payer: BC Managed Care – PPO | Admitting: Family Medicine

## 2022-09-05 VITALS — BP 151/99 | HR 92 | Temp 98.5°F | Ht 74.0 in | Wt 303.8 lb

## 2022-09-05 DIAGNOSIS — E1122 Type 2 diabetes mellitus with diabetic chronic kidney disease: Secondary | ICD-10-CM

## 2022-09-05 DIAGNOSIS — E785 Hyperlipidemia, unspecified: Secondary | ICD-10-CM

## 2022-09-05 DIAGNOSIS — E1159 Type 2 diabetes mellitus with other circulatory complications: Secondary | ICD-10-CM

## 2022-09-05 DIAGNOSIS — F339 Major depressive disorder, recurrent, unspecified: Secondary | ICD-10-CM

## 2022-09-05 DIAGNOSIS — E1165 Type 2 diabetes mellitus with hyperglycemia: Secondary | ICD-10-CM | POA: Diagnosis not present

## 2022-09-05 DIAGNOSIS — E1169 Type 2 diabetes mellitus with other specified complication: Secondary | ICD-10-CM | POA: Diagnosis not present

## 2022-09-05 DIAGNOSIS — L719 Rosacea, unspecified: Secondary | ICD-10-CM

## 2022-09-05 DIAGNOSIS — F900 Attention-deficit hyperactivity disorder, predominantly inattentive type: Secondary | ICD-10-CM

## 2022-09-05 DIAGNOSIS — I10 Essential (primary) hypertension: Secondary | ICD-10-CM | POA: Diagnosis not present

## 2022-09-05 DIAGNOSIS — I152 Hypertension secondary to endocrine disorders: Secondary | ICD-10-CM

## 2022-09-05 DIAGNOSIS — N183 Chronic kidney disease, stage 3 unspecified: Secondary | ICD-10-CM | POA: Insufficient documentation

## 2022-09-05 DIAGNOSIS — N521 Erectile dysfunction due to diseases classified elsewhere: Secondary | ICD-10-CM

## 2022-09-05 LAB — BAYER DCA HB A1C WAIVED: HB A1C (BAYER DCA - WAIVED): 13 % — ABNORMAL HIGH (ref 4.8–5.6)

## 2022-09-05 MED ORDER — SILDENAFIL CITRATE 25 MG PO TABS
25.0000 mg | ORAL_TABLET | Freq: Every day | ORAL | 0 refills | Status: DC | PRN
Start: 2022-09-05 — End: 2022-11-13

## 2022-09-05 MED ORDER — CARVEDILOL 25 MG PO TABS
25.0000 mg | ORAL_TABLET | Freq: Two times a day (BID) | ORAL | 3 refills | Status: DC
Start: 2022-09-05 — End: 2022-11-03

## 2022-09-05 MED ORDER — METRONIDAZOLE 1 % EX GEL
Freq: Every day | CUTANEOUS | 0 refills | Status: DC
Start: 2022-09-05 — End: 2023-04-19

## 2022-09-05 MED ORDER — TIRZEPATIDE 2.5 MG/0.5ML ~~LOC~~ SOAJ
2.5000 mg | SUBCUTANEOUS | 3 refills | Status: DC
Start: 2022-09-05 — End: 2022-09-05

## 2022-09-05 MED ORDER — DAPAGLIFLOZIN PROPANEDIOL 5 MG PO TABS
5.0000 mg | ORAL_TABLET | Freq: Every day | ORAL | 6 refills | Status: DC
Start: 2022-09-05 — End: 2022-10-18

## 2022-09-05 MED ORDER — TIRZEPATIDE 2.5 MG/0.5ML ~~LOC~~ SOPN
2.5000 mg | PEN_INJECTOR | SUBCUTANEOUS | 0 refills | Status: AC
Start: 2022-09-05 — End: 2022-09-27

## 2022-09-05 MED ORDER — BUPROPION HCL ER (XL) 150 MG PO TB24
150.0000 mg | ORAL_TABLET | Freq: Every day | ORAL | 1 refills | Status: DC
Start: 2022-09-05 — End: 2022-12-07

## 2022-09-05 MED ORDER — TIRZEPATIDE 7.5 MG/0.5ML ~~LOC~~ SOPN
7.5000 mg | PEN_INJECTOR | SUBCUTANEOUS | 3 refills | Status: AC
Start: 2022-09-05 — End: 2022-09-09

## 2022-09-05 MED ORDER — TIRZEPATIDE 5 MG/0.5ML ~~LOC~~ SOPN
5.0000 mg | PEN_INJECTOR | SUBCUTANEOUS | 0 refills | Status: AC
Start: 2022-09-05 — End: 2022-09-27

## 2022-09-05 NOTE — Progress Notes (Signed)
Subjective:  Patient ID: Zachary [name], male    DOB: 06-05-1965, 57 y.o.   MRN: [id number]  Patient Care Team: Sonny Masters, FNP as PCP - General (Family Medicine)   Chief Complaint:  New Patient (Initial Visit) ([facility] ), Establish Care, and Diabetes   HPI: Zachary [name] is a 57 y.o. male presenting on 09/05/2022 for New Patient (Initial Visit) ([facility] ), Establish Care, and Diabetes   Pt presents today to establish care with new PCP and for management of chronic medical conditions. He has a history of T2DM with associated hypertension, CKD, hyperlipidemia, obesity, and ED. He has been noncompliant with his medications over the last 6 months, states he is only taking metformin once daily and has not been taking his Ozempic. He does not follow a diet or exercise routine. He has ADHD, binge eating disorder and reports recent depression and anxiety. He has been on Vyvanse in the past with increased insomnia. He has not tried Wellbutrin or non stimulant medications. 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 He has well controlled asthma. He does report an ongoing rash to his face, has been present for at least 6 months. Erythema with papules and some crusting at times. He has tried lotion and steroid cream without resolution of symptoms. Has not seen derm for this in the past.       09/05/2022    3:03 PM 07/12/2015    4:21 PM  Depression screen PHQ 2/9  Decreased Interest 2 0  Down, Depressed, Hopeless 2 0  PHQ - 2 Score 4 0  Altered sleeping 3   Tired, decreased energy 2   Change in appetite 3   Feeling bad or failure about yourself  1   Trouble concentrating 3   Moving slowly or fidgety/restless 3   Suicidal thoughts 0   PHQ-9 Score 19   Difficult doing work/chores Very difficult       09/05/2022    3:03 PM  GAD 7 : Generalized Anxiety Score  Nervous,  Anxious, on Edge 0  Control/stop worrying 0  Worry too much - different things 1  Trouble relaxing 2  Restless 3  Easily annoyed or irritable 3  Afraid - awful might happen 2  Total GAD 7 Score 11  Anxiety Difficulty Somewhat difficult      Relevant past medical, surgical, family, and social history reviewed and updated as indicated.  Allergies and medications reviewed and updated. Data reviewed: Chart in Epic.   Past Medical History:  Diagnosis Date   ADHD    Asthma    Binge eating disorder    Diabetes mellitus    GERD (gastroesophageal reflux disease)    Gout    Hyperlipidemia    Hypertension    Insomnia    Kidney stones    Low magnesium level    Vitamin D deficiency     Past Surgical History:  Procedure Laterality Date   WISDOM TOOTH EXTRACTION     WRIST SURGERY Right     Social History   Socioeconomic History   Marital status: Married    Spouse name: Not on file   Number of children: Not on file   Years of education: Not on file   Highest education level: Not on file  Occupational History   Not on file  Tobacco Use   Smoking status: Never  Smokeless tobacco: Never  Vaping Use   Vaping Use: Never used  Substance and Sexual Activity   Alcohol use: Yes    Comment: occ   Drug use: No   Sexual activity: Not Currently  Other Topics Concern   Not on file  Social History Narrative   Lives with spouse and kids   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   aspirin 81  MG tablet Take 81 mg by mouth daily.    buPROPion (WELLBUTRIN XL) 150 MG 24 hr tablet Take 1 tablet (150 mg total) by mouth daily.   carvedilol (COREG) 25 MG tablet Take 1 tablet (25 mg total) by mouth 2 (two) times daily with a meal.   dapagliflozin propanediol (FARXIGA) 5 MG TABS tablet Take 1 tablet (5 mg total) by mouth daily before breakfast.   glucose blood (ONETOUCH VERIO) test strip 1 each by Other route 4 (four) times daily. And lancets 4/day   magnesium oxide (MAG-OX) 400 MG tablet Take 400 mg by mouth daily.   meloxicam (MOBIC) 15 MG tablet Take 15 mg by mouth daily.   metroNIDAZOLE (METROGEL) 1 % gel Apply topically daily.   rosuvastatin (CRESTOR) 20 MG tablet TAKE ONE TABLET BY MOUTH DAILY   sildenafil (VIAGRA) 25 MG tablet Take 1 tablet (25 mg total) by mouth daily as needed for erectile dysfunction.   temazepam (RESTORIL) 30 MG capsule Take 1 capsule (30 mg total) by mouth at bedtime as needed. for sleep   tirzepatide [facility]) 2.5 MG/0.5ML Pen Inject 2.5 mg into the skin once a week for 4 doses.   tirzepatide [facility]) 5 MG/0.5ML Pen Inject 5 mg into the skin once a week for 4 doses.   tirzepatide (MOUNJARO) 7.5 MG/0.5ML Pen Inject 7.5 mg into the skin once a week for 4 days.   valsartan-hydrochlorothiazide (DIOVAN-HCT) 320-25 MG tablet TAKE 1 TABLET BY MOUTH DAILY   [DISCONTINUED] carvedilol (COREG) 12.5 MG tablet TAKE 1 TABLET (12.5 MG TOTAL) BY MOUTH 2 (TWO) TIMES DAILY WITH A MEAL.   [DISCONTINUED] metFORMIN (GLUCOPHAGE-XR) 500 MG 24 hr tablet TAKE 2 TABLETS BY MOUTH EVERY MORNING   [DISCONTINUED] sildenafil (REVATIO) 20 MG tablet TAKE 5 TABLETS BY MOUTH ONCE AS NEEDED   [DISCONTINUED] tirzepatide (MOUNJARO) 2.5 MG/0.5ML Pen Inject 2.5 mg into the skin once a week.   amLODipine (NORVASC) 5 MG tablet Take 5 mg by mouth daily.   [DISCONTINUED] esomeprazole (NEXIUM) 20 MG packet Take 20 mg by mouth daily before breakfast.   [DISCONTINUED] lisdexamfetamine (VYVANSE) 70 MG  capsule Take 1 capsule (70 mg total) by mouth daily.   [DISCONTINUED] Semaglutide,0.25 or 0.5MG /DOS, (OZEMPIC, 0.25 OR 0.5 MG/DOSE,) 2 MG/1.5ML SOPN Inject 1 applicator into the skin once a week. (Patient not taking: Reported on 09/05/2022)   No facility-administered encounter medications on file as of 09/05/2022.    Allergies  Allergen Reactions   Ace Inhibitors     REACTION: cough   Sulfamethoxazole-Trimethoprim  REACTION: unspecified    Review of Systems  Constitutional:  Positive for activity change, appetite change and fatigue. Negative for chills, diaphoresis, fever and unexpected weight change.  HENT: Negative.    Eyes: Negative.  Negative for photophobia and visual disturbance.  Respiratory:  Negative for cough, chest tightness, shortness of breath and wheezing.   Cardiovascular:  Negative for chest pain, palpitations and leg swelling.  Gastrointestinal:  Negative for abdominal pain, blood in stool, constipation, diarrhea, nausea and vomiting.  Endocrine: Negative.  Negative for cold intolerance, heat intolerance, polydipsia, polyphagia and polyuria.  Genitourinary:  Negative for decreased urine volume, difficulty urinating, dysuria, frequency and urgency.  Musculoskeletal:  Negative for arthralgias and myalgias.  Skin:  Positive for color change and rash. Negative for pallor and wound.  Allergic/Immunologic: Negative.   Neurological:  Negative for dizziness, tremors, seizures, syncope, facial asymmetry, speech difficulty, weakness, light-headedness, numbness and headaches.  Hematological: Negative.   Psychiatric/Behavioral:  Positive for agitation, decreased concentration, dysphoric mood and sleep disturbance. Negative for behavioral problems, confusion, hallucinations, self-injury and suicidal ideas. The patient is nervous/anxious. The patient is not hyperactive.   All other systems reviewed and are negative.       Objective:  BP (!) 151/99   Pulse 92   Temp 98.5 F  (36.9 C) (Temporal)   Ht 6\' 2"  (1.88 m)   Wt (!) 303 lb 12.8 oz (137.8 kg)   SpO2 93%   BMI 39.01 kg/m    Wt Readings from Last 3 Encounters:  09/05/22 (!) 303 lb 12.8 oz (137.8 kg)  05/22/18 287 lb 2 oz (130.2 kg)  03/30/17 (!) 327 lb 9.6 oz (148.6 kg)    Physical Exam Vitals and nursing note reviewed.  Constitutional:      General: He is not in acute distress.    Appearance: Normal appearance. He is well-developed and well-groomed. He is morbidly obese. He is not ill-appearing, toxic-appearing or diaphoretic.  HENT:     Head: Normocephalic and atraumatic.     Jaw: There is normal jaw occlusion.     Right Ear: Hearing normal.     Left Ear: Hearing normal.     Nose: Nose normal.     Mouth/Throat:     Lips: Pink.     Mouth: Mucous membranes are moist.     Pharynx: Oropharynx is clear. Uvula midline.  Eyes:     General: Lids are normal.     Extraocular Movements: Extraocular movements intact.     Conjunctiva/sclera: Conjunctivae normal.     Pupils: Pupils are equal, round, and reactive to light.  Neck:     Thyroid: No thyroid mass, thyromegaly or thyroid tenderness.     Vascular: No carotid bruit or JVD.     Trachea: Trachea and phonation normal.  Cardiovascular:     Rate and Rhythm: Normal rate and regular rhythm.     Chest Wall: PMI is not displaced.     Pulses: Normal pulses.     Heart sounds: Normal heart sounds. No murmur heard.    No friction rub. No gallop.  Pulmonary:     Effort: Pulmonary effort is normal. No respiratory distress.     Breath sounds: Normal breath sounds. No wheezing.  Abdominal:     General: Bowel sounds are normal. There is no distension or abdominal bruit.     Palpations: Abdomen is soft. There is no hepatomegaly or splenomegaly.     Tenderness: There is no abdominal tenderness. There is no right CVA tenderness  or left CVA tenderness.     Hernia: No hernia is present.  Musculoskeletal:        General: Normal range of motion.     Cervical  back: Normal range of motion and neck supple.     Right lower leg: No edema.     Left lower leg: No edema.  Lymphadenopathy:     Cervical: No cervical adenopathy.  Skin:    General: Skin is warm and dry.     Capillary Refill: Capillary refill takes less than 2 seconds.     Coloration: Skin is not cyanotic, jaundiced or pale.     Findings: Erythema and rash (face) present. Rash is crusting and papular.  Neurological:     General: No focal deficit present.     Mental Status: He is alert and oriented to person, place, and time.     Sensory: Sensation is intact.     Motor: Motor function is intact.     Coordination: Coordination is intact.     Gait: Gait is intact.     Deep Tendon Reflexes: Reflexes are normal and symmetric.  Psychiatric:        Attention and Perception: Attention and perception normal.        Mood and Affect: Mood and affect normal.        Speech: Speech normal.        Behavior: Behavior normal. Behavior is cooperative.        Thought Content: Thought content normal.        Cognition and Memory: Cognition and memory normal.        Judgment: Judgment normal.     Results for orders placed or performed in visit on 05/22/18  Basic metabolic panel  Result Value Ref Range   Sodium 141 135 - 145 mEq/L   Potassium 4.3 3.5 - 5.1 mEq/L   Chloride 101 96 - 112 mEq/L   CO2 27 19 - 32 mEq/L   Glucose, Bld 66 (L) 70 - 99 mg/dL   BUN 18 6 - 23 mg/dL   Creatinine, Ser 1.61 (H) 0.40 - 1.50 mg/dL   Calcium 9.9 8.4 - 09.6 mg/dL   GFR 04.54 (L) >09.81 mL/min  Hepatic function panel  Result Value Ref Range   Total Bilirubin 1.1 0.2 - 1.2 mg/dL   Bilirubin, Direct 0.2 0.0 - 0.3 mg/dL   Alkaline Phosphatase 57 39 - 117 U/L   AST 44 (H) 0 - 37 U/L   ALT 66 (H) 0 - 53 U/L   Total Protein 7.8 6.0 - 8.3 g/dL   Albumin 4.9 3.5 - 5.2 g/dL  PSA  Result Value Ref Range   PSA 0.55 0.10 - 4.00 ng/mL       Pertinent labs & imaging results that were available during my care of the  patient were reviewed by me and considered in my medical decision making.  Assessment & Plan:  Dregan was seen today for new patient (initial visit), establish care and diabetes.  Diagnoses and all orders for this visit:  Type 2 diabetes mellitus with hyperglycemia, without long-term current use of insulin (HCC) A1C 13 in office today. Will start [place]. Escalation of Mounjaro dosing discussed in detail. Other labs pending. Compliance with diet and exercise discussed in detail. Pt to follow up in 3 months for repeat labs.  -     CBC with Differential/Platelet -     CMP14+EGFR -     Bayer DCA Hb A1c Waived -  Thyroid Panel With TSH -     Microalbumin / creatinine urine ratio -     dapagliflozin propanediol (FARXIGA) 5 MG TABS tablet; Take 1 tablet (5 mg total) by mouth daily before breakfast. -     tirzepatide (MOUNJARO) 2.5 MG/0.5ML Pen; Inject 2.5 mg into the skin once a week for 4 doses. -     tirzepatide (MOUNJARO) 5 MG/0.5ML Pen; Inject 5 mg into the skin once a week for 4 doses. -     tirzepatide (MOUNJARO) 7.5 MG/0.5ML Pen; Inject 7.5 mg into the skin once a week for 4 days.  Hyperlipidemia associated with type 2 diabetes mellitus (HCC) Diet and exercise discussed in detail. Continue statin.  -     CBC with Differential/Platelet -     CMP14+EGFR -     Bayer DCA Hb A1c Waived -     Thyroid Panel With TSH  Hypertension associated with diabetes (HCC) Not controlled. DASH diet discussed in detail. HR high normal, will increase Coreg to 25 mg twice daily.  -     CBC with Differential/Platelet -     CMP14+EGFR -     Bayer DCA Hb A1c Waived -     Thyroid Panel With TSH -     carvedilol (COREG) 25 MG tablet; Take 1 tablet (25 mg total) by mouth 2 (two) times daily with a meal.  Erectile dysfunction due to diabetes mellitus (HCC) Will trial below.  -     sildenafil (VIAGRA) 25 MG tablet; Take 1 tablet (25 mg total) by mouth daily as needed for erectile  dysfunction.  CKD stage 3 due to type 2 diabetes mellitus (HCC) Uncontrolled diabetes with CKD, will add [place] today.  -     dapagliflozin propanediol (FARXIGA) 5 MG TABS tablet; Take 1 tablet (5 mg total) by mouth daily before breakfast.  Rosacea Rash appears to be rosacea, will trial below to see if beneficial.  -     metroNIDAZOLE (METROGEL) 1 % gel; Apply topically daily.  ADHD (attention deficit hyperactivity disorder), inattentive type Will not restart stimulants. Will trail below. Pt aware of proper dosing. Will be dual beneficial for anxiety and depressive symptoms.  -     buPROPion (WELLBUTRIN XL) 150 MG 24 hr tablet; Take 1 tablet (150 mg total) by mouth daily.  Depression, recurrent (HCC) Will trial below.  -     buPROPion (WELLBUTRIN XL) 150 MG 24 hr tablet; Take 1 tablet (150 mg total) by mouth daily.  Morbid obesity (HCC) Diet and exercise encouraged. Labs pending.  -     CBC with Differential/Platelet -     CMP14+EGFR -     Bayer DCA Hb A1c Waived -     Thyroid Panel With TSH     Continue all other maintenance medications.  Follow up plan: Return in about 3 months (around 12/06/2022), or if symptoms worsen or fail to improve, for CPE.   Continue healthy lifestyle choices, including diet (rich in fruits, vegetables, and lean proteins, and low in salt and simple carbohydrates) and exercise (at least 30 minutes of moderate physical activity daily).  Educational handout given for DM  The above assessment and management plan was discussed with the patient. The patient verbalized understanding of and has agreed to the management plan. Patient is aware to call the clinic if they develop any new symptoms or if symptoms persist or worsen. Patient is aware when to return to the clinic for a follow-up visit. Patient educated on when  it is appropriate to go to the emergency department.   Kari Baars, FNP-C [facility] [phone]

## 2022-09-05 NOTE — Patient Instructions (Addendum)

## 2022-09-06 LAB — CMP14+EGFR
ALT: 79 IU/L — ABNORMAL HIGH (ref 0–44)
AST: 59 IU/L — ABNORMAL HIGH (ref 0–40)
Albumin/Globulin Ratio: 1.5
Albumin: 4.5 g/dL (ref 3.8–4.9)
Alkaline Phosphatase: 67 IU/L (ref 44–121)
BUN/Creatinine Ratio: 11 (ref 9–20)
BUN: 15 mg/dL (ref 6–24)
Bilirubin Total: 0.6 mg/dL (ref 0.0–1.2)
CO2: 20 mmol/L (ref 20–29)
Calcium: 9.5 mg/dL (ref 8.7–10.2)
Chloride: 97 mmol/L (ref 96–106)
Creatinine, Ser: 1.35 mg/dL — ABNORMAL HIGH (ref 0.76–1.27)
Globulin, Total: 3 g/dL (ref 1.5–4.5)
Glucose: 325 mg/dL — ABNORMAL HIGH (ref 70–99)
Potassium: 4.2 mmol/L (ref 3.5–5.2)
Sodium: 135 mmol/L (ref 134–144)
Total Protein: 7.5 g/dL (ref 6.0–8.5)
eGFR: 62 mL/min/{1.73_m2} (ref >59)

## 2022-09-06 LAB — CBC WITH DIFFERENTIAL/PLATELET
Basophils Absolute: 0 10*3/uL (ref 0.0–0.2)
Basos: 1 %
EOS (ABSOLUTE): 0.1 10*3/uL (ref 0.0–0.4)
Eos: 2 %
Hematocrit: 52.3 % — ABNORMAL HIGH (ref 37.5–51.0)
Hemoglobin: 17.6 g/dL (ref 13.0–17.7)
Immature Grans (Abs): 0 10*3/uL (ref 0.0–0.1)
Immature Granulocytes: 0 %
Lymphocytes Absolute: 2.1 10*3/uL (ref 0.7–3.1)
Lymphs: 33 %
MCH: 30.5 pg (ref 26.6–33.0)
MCHC: 33.7 g/dL (ref 31.5–35.7)
MCV: 91 fL (ref 79–97)
Monocytes Absolute: 0.4 10*3/uL (ref 0.1–0.9)
Monocytes: 5 %
Neutrophils Absolute: 3.8 10*3/uL (ref 1.4–7.0)
Neutrophils: 59 %
Platelets: 218 10*3/uL (ref 150–450)
RBC: 5.77 x10E6/uL (ref 4.14–5.80)
RDW: 13.1 % (ref 11.6–15.4)
WBC: 6.5 10*3/uL (ref 3.4–10.8)

## 2022-09-06 LAB — THYROID PANEL WITH TSH
Free Thyroxine Index: 2.2 (ref 1.2–4.9)
T3 Uptake Ratio: 27 % (ref 24–39)
T4, Total: 8.2 ug/dL (ref 4.5–12.0)
TSH: 1.57 u[IU]/mL (ref 0.450–4.500)

## 2022-09-07 ENCOUNTER — Encounter: Payer: Self-pay | Admitting: Family Medicine

## 2022-09-07 DIAGNOSIS — F4320 Adjustment disorder, unspecified: Secondary | ICD-10-CM | POA: Diagnosis not present

## 2022-09-08 DIAGNOSIS — F4320 Adjustment disorder, unspecified: Secondary | ICD-10-CM | POA: Diagnosis not present

## 2022-09-14 DIAGNOSIS — F4320 Adjustment disorder, unspecified: Secondary | ICD-10-CM | POA: Diagnosis not present

## 2022-09-15 DIAGNOSIS — F4320 Adjustment disorder, unspecified: Secondary | ICD-10-CM | POA: Diagnosis not present

## 2022-09-18 DIAGNOSIS — F4320 Adjustment disorder, unspecified: Secondary | ICD-10-CM | POA: Diagnosis not present

## 2022-09-19 ENCOUNTER — Encounter: Payer: Self-pay | Admitting: Orthopaedic Surgery

## 2022-09-21 DIAGNOSIS — F4320 Adjustment disorder, unspecified: Secondary | ICD-10-CM | POA: Diagnosis not present

## 2022-09-22 DIAGNOSIS — F4320 Adjustment disorder, unspecified: Secondary | ICD-10-CM | POA: Diagnosis not present

## 2022-09-26 DIAGNOSIS — F4320 Adjustment disorder, unspecified: Secondary | ICD-10-CM | POA: Diagnosis not present

## 2022-09-27 ENCOUNTER — Ambulatory Visit (INDEPENDENT_AMBULATORY_CARE_PROVIDER_SITE_OTHER): Payer: BC Managed Care – PPO

## 2022-09-27 ENCOUNTER — Ambulatory Visit (HOSPITAL_BASED_OUTPATIENT_CLINIC_OR_DEPARTMENT_OTHER): Payer: BC Managed Care – PPO | Admitting: Student

## 2022-09-27 ENCOUNTER — Encounter (HOSPITAL_BASED_OUTPATIENT_CLINIC_OR_DEPARTMENT_OTHER): Payer: Self-pay | Admitting: Student

## 2022-09-27 DIAGNOSIS — M1712 Unilateral primary osteoarthritis, left knee: Secondary | ICD-10-CM | POA: Diagnosis not present

## 2022-09-27 DIAGNOSIS — M25462 Effusion, left knee: Secondary | ICD-10-CM | POA: Diagnosis not present

## 2022-09-27 DIAGNOSIS — M25562 Pain in left knee: Secondary | ICD-10-CM

## 2022-09-27 DIAGNOSIS — F4320 Adjustment disorder, unspecified: Secondary | ICD-10-CM | POA: Diagnosis not present

## 2022-09-27 NOTE — Progress Notes (Signed)
Chief Complaint: Left knee pain     History of Present Illness:    Zachary [name] is a 57 y.o. male presenting today for evaluation of left knee pain.  He reports slowly worsening pain and swelling of the knee over the last few months.  This has gotten significantly worse over the past 2 days with no known injury.  Pain is located on the lateral side of the knee and is moderate in severity.  Symptoms worsen when walking up or down stairs/hills as well as keeping the knee bent for long periods.  His knee tends to swell after periods of walking or activity.  Also notes that the knee feels weak and occasionally feels some instability.  Has tried a few days of Mobic which improved pain but not swelling and has tried icing.   Surgical History:   None  PMH/PSH/Family History/Social History/Meds/Allergies:    Past Medical History:  Diagnosis Date   ADHD    Asthma    Binge eating disorder    Diabetes mellitus    GERD (gastroesophageal reflux disease)    Gout    Hyperlipidemia    Hypertension    Insomnia    Kidney stones    Low magnesium level    Vitamin D deficiency    Past Surgical History:  Procedure Laterality Date   WISDOM TOOTH EXTRACTION     WRIST SURGERY Right    Social History   Socioeconomic History   Marital status: Married    Spouse name: Not on file   Number of children: Not on file   Years of education: Not on file   Highest education level: Not on file  Occupational History   Not on file  Tobacco Use   Smoking status: Never   Smokeless tobacco: Never  Vaping Use   Vaping Use: Never used  Substance and Sexual Activity   Alcohol use: Yes    Comment: occ   Drug use: No   Sexual activity: Not Currently  Other Topics Concern   Not on file  Social History Narrative   Lives with spouse and kids   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         Social Determinants of Health   Financial Resource  Strain: Not on file  Food Insecurity: Not on file  Transportation Needs: Not on file  Physical Activity: Not on file  Stress: Not on file  Social Connections: Not on file   Family History  Problem Relation Age of Onset   Breast cancer Mother    Hypertension Mother    Pneumonia Mother    Crohn's disease Mother    Leukemia Father    GER disease Father    Macular degeneration Father    Alcohol abuse Brother    ADD / ADHD Son    Stroke Maternal Grandmother    Stroke Paternal Grandmother    Allergies  Allergen Reactions   Ace Inhibitors     REACTION: cough   Sulfamethoxazole-Trimethoprim     REACTION: unspecified   Current Outpatient Medications  Medication Sig Dispense Refill   albuterol (PROAIR HFA) 108 (90 BASE) MCG/ACT inhaler Inhale 2 puffs into the lungs every 4 (four) hours as needed for wheezing. 1 Inhaler 5   amLODipine (NORVASC) 5 MG tablet Take 5  mg by mouth daily.     aspirin 81 MG tablet Take 81 mg by mouth daily.      buPROPion (WELLBUTRIN XL) 150 MG 24 hr tablet Take 1 tablet (150 mg total) by mouth daily. 90 tablet 1   carvedilol (COREG) 25 MG tablet Take 1 tablet (25 mg total) by mouth 2 (two) times daily with a meal. 60 tablet 3   dapagliflozin propanediol (FARXIGA) 5 MG TABS tablet Take 1 tablet (5 mg total) by mouth daily before breakfast. 30 tablet 6   glucose blood (ONETOUCH VERIO) test strip 1 each by Other route 4 (four) times daily. And lancets 4/day 400 each 3   magnesium oxide (MAG-OX) 400 MG tablet Take 400 mg by mouth daily.     meloxicam (MOBIC) 15 MG tablet Take 15 mg by mouth daily.     metroNIDAZOLE (METROGEL) 1 % gel Apply topically daily. 45 g 0   rosuvastatin (CRESTOR) 20 MG tablet TAKE ONE TABLET BY MOUTH DAILY 60 tablet 0   sildenafil (VIAGRA) 25 MG tablet Take 1 tablet (25 mg total) by mouth daily as needed for erectile dysfunction. 90 tablet 0   temazepam (RESTORIL) 30 MG capsule Take 1 capsule (30 mg total) by mouth at bedtime as needed.  for sleep 30 capsule 5   tirzepatide (MOUNJARO) 2.5 MG/0.5ML Pen Inject 2.5 mg into the skin once a week for 4 doses. 2 mL 0   tirzepatide (MOUNJARO) 5 MG/0.5ML Pen Inject 5 mg into the skin once a week for 4 doses. 2 mL 0   valsartan-hydrochlorothiazide (DIOVAN-HCT) 320-25 MG tablet TAKE 1 TABLET BY MOUTH DAILY 30 tablet 0   No current facility-administered medications for this visit.   No results found.  Review of Systems:   A ROS was performed including pertinent positives and negatives as documented in the HPI.  Physical Exam :   Constitutional: NAD and appears stated age Neurological: Alert and oriented Psych: Appropriate affect and cooperative There were no vitals taken for this visit.   Comprehensive Musculoskeletal Exam:    Left knee is without erythema or warmth.  There is a mild effusion.  Negative medial or lateral joint line tenderness.  There is tenderness over the lateral border of the patella.  Active range of motion 5 degrees extension to 110 degrees flexion without notable crepitus.  No instability with varus or valgus stress.  Negative [place]achman.  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  Slight medial compartment narrowing.    I personally reviewed and interpreted the radiographs.   Assessment:   57 y.o. male with atraumatic left knee pain and swelling.  His symptoms are consistent with patellofemoral type pain.  X-rays show mild patellofemoral compartment osteoarthritis which could be contributing however I would also consider patellofemoral pain syndrome and chondromalacia.  Patient's last A1c was 13.0 and therefore would not consider cortisone injection. I would like to start him on a home patellofemoral strengthening program as well as recommend trying topical Voltaren as an alternative to oral NSAIDs.  Can continue icing and will also try utilizing any compression sleeve.  Patient has a follow-up  scheduled with Dr. Magnus Ivan on 10/05/2022 for evaluation and can assess discuss any further treatment options at that time.  Plan :    -Patellofemoral strengthening program and NSAIDs as needed -Follow-up with Dr. Magnus Ivan as scheduled     I personally saw and evaluated the patient, and participated in the management and treatment  plan.  Hazle Nordmann, PA-C Orthopedics  This document was dictated using Conservation officer, historic buildings. A reasonable attempt at proof reading has been made to minimize errors.

## 2022-09-29 ENCOUNTER — Ambulatory Visit (HOSPITAL_BASED_OUTPATIENT_CLINIC_OR_DEPARTMENT_OTHER): Payer: BC Managed Care – PPO | Admitting: Student

## 2022-09-29 ENCOUNTER — Encounter: Payer: Self-pay | Admitting: Family Medicine

## 2022-09-29 DIAGNOSIS — E1165 Type 2 diabetes mellitus with hyperglycemia: Secondary | ICD-10-CM | POA: Diagnosis not present

## 2022-09-29 DIAGNOSIS — R55 Syncope and collapse: Secondary | ICD-10-CM | POA: Diagnosis not present

## 2022-09-29 DIAGNOSIS — I517 Cardiomegaly: Secondary | ICD-10-CM | POA: Diagnosis not present

## 2022-09-29 DIAGNOSIS — I371 Nonrheumatic pulmonary valve insufficiency: Secondary | ICD-10-CM | POA: Diagnosis not present

## 2022-09-29 DIAGNOSIS — I358 Other nonrheumatic aortic valve disorders: Secondary | ICD-10-CM | POA: Diagnosis not present

## 2022-09-29 DIAGNOSIS — E119 Type 2 diabetes mellitus without complications: Secondary | ICD-10-CM | POA: Diagnosis not present

## 2022-09-29 DIAGNOSIS — Z7985 Long-term (current) use of injectable non-insulin antidiabetic drugs: Secondary | ICD-10-CM | POA: Diagnosis not present

## 2022-10-02 ENCOUNTER — Telehealth: Payer: Self-pay

## 2022-10-02 NOTE — Transitions of Care (Post Inpatient/ED Visit) (Signed)
10/02/2022  Name: Zachary [name] MRN: [id number] DOB: 10/18/1965  Today's TOC FU Call Status: Today's TOC FU Call Status:: Successful TOC FU Call Competed TOC FU Call Complete Date: 10/02/22  Transition Care Management Follow-up Telephone Call Date of Discharge: 09/29/22 Discharge Facility: Other ([facility]) Name of Other ([facility]) Discharge Facility: [facility] Type of Discharge: Inpatient Admission Primary Inpatient Discharge Diagnosis:: syncope How have you been since you were released from the hospital?: Better  Items Reviewed: Did you receive and understand the discharge instructions provided?: No Medications obtained,verified, and reconciled?: Yes (Medications Reviewed) Any new allergies since your discharge?: No Dietary orders reviewed?: Yes Do you have support at home?: Yes People in Home: spouse  Medications Reviewed Today: Medications Reviewed Today     Reviewed by Karena Addison, LPN (Licensed Practical Nurse) on 10/02/22 at 1155  Med List Status: <None>   Medication Order Taking? Sig Documenting Provider Last Dose Status Informant  albuterol (PROAIR HFA) 108 (90 BASE) MCG/ACT inhaler [id number] No Inhale 2 puffs into the lungs every 4 (four) hours as needed for wheezing. Nelwyn Salisbury, MD Taking Active   amLODipine (NORVASC) 5 MG tablet [id number]  Take 5 mg by mouth daily. [provider]  Active   aspirin 81 MG tablet [id number] No Take 81 mg by mouth daily.  [provider] Taking Active   buPROPion (WELLBUTRIN XL) 150 MG 24 hr tablet [id number]  Take 1 tablet (150 mg total) by mouth daily. Sonny Masters, FNP  Active   carvedilol (COREG) 25 MG tablet [id number]  Take 1 tablet (25 mg total) by mouth 2 (two) times daily with a meal. Rakes, Doralee Albino, FNP  Active   dapagliflozin propanediol (FARXIGA) 5 MG TABS tablet [id number]  Take 1 tablet (5 mg total) by mouth daily before breakfast. Sonny Masters, FNP  Active   glucose blood [facility] VERIO) test  strip [id number] No 1 each by Other route 4 (four) times daily. And lancets 4/day Nelwyn Salisbury, MD Taking Active   magnesium oxide (MAG-OX) 400 MG tablet [id number] No Take 400 mg by mouth daily. [provider] Taking Active   meloxicam (MOBIC) 15 MG tablet [id number]  Take 15 mg by mouth daily. [provider]  Active   metroNIDAZOLE (METROGEL) 1 % gel [id number]  Apply topically daily. Sonny Masters, FNP  Active   rosuvastatin (CRESTOR) 20 MG tablet [id number] No TAKE ONE TABLET BY MOUTH DAILY Nelwyn Salisbury, MD Taking Active   sildenafil (VIAGRA) 25 MG tablet [id number]  Take 1 tablet (25 mg total) by mouth daily as needed for erectile dysfunction. Sonny Masters, FNP  Active   temazepam (RESTORIL) 30 MG capsule [id number] No Take 1 capsule (30 mg total) by mouth at bedtime as needed. for sleep Nelwyn Salisbury, MD Taking Active   valsartan-hydrochlorothiazide (DIOVAN-HCT) 320-25 MG tablet [id number] No TAKE 1 TABLET BY MOUTH DAILY Nelwyn Salisbury, MD Taking Active             Home Care and Equipment/Supplies: Were Home Health Services Ordered?: NA Any new equipment or medical supplies ordered?: NA  Functional Questionnaire: Do you need assistance with bathing/showering or dressing?: No Do you need assistance with meal preparation?: No Do you need assistance with eating?: No Do you have difficulty maintaining continence: No Do you need assistance with getting out of bed/getting out of a chair/moving?: No Do you have difficulty managing or taking your medications?: No  Follow up appointments reviewed: PCP Follow-up appointment  confirmed?: Yes Date of PCP follow-up appointment?: 10/03/22 Follow-up Provider: [facility] Follow-up appointment confirmed?: NA Do you need transportation to your follow-up appointment?: No Do you understand care options if your condition(s) worsen?: Yes-patient verbalized understanding    SIGNATURE Karena Addison, LPN [facility]  Nurse Health Advisor Direct Dial [phone]

## 2022-10-03 ENCOUNTER — Encounter: Payer: Self-pay | Admitting: Family Medicine

## 2022-10-03 ENCOUNTER — Ambulatory Visit: Payer: BC Managed Care – PPO | Admitting: Physician Assistant

## 2022-10-03 ENCOUNTER — Ambulatory Visit: Payer: BC Managed Care – PPO | Admitting: Family Medicine

## 2022-10-03 ENCOUNTER — Other Ambulatory Visit (INDEPENDENT_AMBULATORY_CARE_PROVIDER_SITE_OTHER): Payer: BC Managed Care – PPO

## 2022-10-03 VITALS — BP 152/99 | HR 91 | Temp 97.8°F | Ht 74.0 in | Wt 297.2 lb

## 2022-10-03 DIAGNOSIS — E1159 Type 2 diabetes mellitus with other circulatory complications: Secondary | ICD-10-CM | POA: Diagnosis not present

## 2022-10-03 DIAGNOSIS — R591 Generalized enlarged lymph nodes: Secondary | ICD-10-CM

## 2022-10-03 DIAGNOSIS — E1165 Type 2 diabetes mellitus with hyperglycemia: Secondary | ICD-10-CM | POA: Diagnosis not present

## 2022-10-03 DIAGNOSIS — R55 Syncope and collapse: Secondary | ICD-10-CM | POA: Diagnosis not present

## 2022-10-03 DIAGNOSIS — I152 Hypertension secondary to endocrine disorders: Secondary | ICD-10-CM

## 2022-10-03 DIAGNOSIS — F4320 Adjustment disorder, unspecified: Secondary | ICD-10-CM | POA: Diagnosis not present

## 2022-10-03 DIAGNOSIS — D582 Other hemoglobinopathies: Secondary | ICD-10-CM | POA: Diagnosis not present

## 2022-10-03 MED ORDER — FREESTYLE LIBRE 3 SENSOR MISC
1.0000 | Freq: Four times a day (QID) | 11 refills | Status: DC
Start: 2022-10-03 — End: 2022-10-04

## 2022-10-03 MED ORDER — FREESTYLE LIBRE 3 READER DEVI
1.0000 | Freq: Four times a day (QID) | 3 refills | Status: DC
Start: 2022-10-03 — End: 2022-10-04

## 2022-10-03 NOTE — Progress Notes (Signed)
Subjective:  Patient ID: Zachary [name], male    DOB: 10/11/1965, 57 y.o.   MRN: [id number]  Patient Care Team: Sonny Masters, FNP as PCP - General (Family Medicine)   Chief Complaint:  ER follow up  (09/29/2022/[facility] - EMERGENCY DEPARTMENt- NEAR SYNCOPE )   HPI: Zachary [name] is a 57 y.o. male presenting on 10/03/2022 for ER follow up  (09/29/2022/[facility] - EMERGENCY DEPARTMENt- NEAR SYNCOPE )  Pt s/p ED admit for near syncopal event on 7/5. Pt had AKI at that time (cr 1.68). No further sequale since then.   Hypertension Complaint with meds - Yes Current Medications - diovan, norvasc, correg Checking BP at home ranging 150s Exercising Regularly - Yes Watching Salt intake - Yes Pertinent ROS:  Headache - No Fatigue - No Visual Disturbances - No Chest pain - No Dyspnea - No Palpitations - No LE edema - No They report good compliance with medications and can restate their regimen by memory. No medication side effects.  BP Readings from Last 3 Encounters:  10/03/22 (!) 152/99  09/05/22 (!) 151/99  05/22/18 120/72      Relevant past medical, surgical, family, and social history reviewed and updated as indicated.  Allergies and medications reviewed and updated. Data reviewed: Chart in Epic.   Past Medical History:  Diagnosis Date   ADHD    Asthma    Binge eating disorder    Diabetes mellitus    GERD (gastroesophageal reflux disease)    Gout    Hyperlipidemia    Hypertension    Insomnia    Kidney stones    Low magnesium level    Vitamin D deficiency     Past Surgical History:  Procedure Laterality Date   WISDOM TOOTH EXTRACTION     WRIST SURGERY Right     Social History   Socioeconomic History   Marital status: Married    Spouse name: Not on file   Number of children: Not on file   Years of education: Not on file   Highest education level: Not on file  Occupational  History   Not on file  Tobacco Use   Smoking status: Never   Smokeless tobacco: Never  Vaping Use   Vaping Use: Never used  Substance and Sexual Activity   Alcohol use: Yes    Comment: occ   Drug use: No   Sexual activity: Not Currently  Other Topics Concern   Not on file  Social History Narrative   Lives with spouse and kids   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   amLODipine (NORVASC) 5 MG tablet Take 5 mg by mouth daily.   aspirin 81 MG tablet Take 81 mg by mouth daily.    buPROPion (WELLBUTRIN XL) 150 MG 24 hr tablet Take 1 tablet (150 mg total) by mouth daily.   carvedilol (COREG)  25 MG tablet Take 1 tablet (25 mg total) by mouth 2 (two) times daily with a meal. (Patient taking differently: Take 12.5 mg by mouth 2 (two) times daily with a meal.)   Continuous Glucose Receiver (FREESTYLE LIBRE 3 READER) DEVI 1 each by Does not apply route in the morning, at noon, in the evening, and at bedtime.   Continuous Glucose Sensor (FREESTYLE LIBRE 3 SENSOR) MISC 1 each by Does not apply route 4 (four) times daily. Place 1 sensor on the skin every 14 days. Use to check glucose continuously   dapagliflozin propanediol (FARXIGA) 5 MG TABS tablet Take 1 tablet (5 mg total) by mouth daily before breakfast.   glucose blood (ONETOUCH VERIO) test strip 1 each by Other route 4 (four) times daily. And lancets 4/day   magnesium oxide (MAG-OX) 400 MG tablet Take 400 mg by mouth daily.   meloxicam (MOBIC) 15 MG  tablet Take 15 mg by mouth daily.   metroNIDAZOLE (METROGEL) 1 % gel Apply topically daily.   rosuvastatin (CRESTOR) 20 MG tablet TAKE ONE TABLET BY MOUTH DAILY   sildenafil (VIAGRA) 25 MG tablet Take 1 tablet (25 mg total) by mouth daily as needed for erectile dysfunction.   valsartan-hydrochlorothiazide (DIOVAN-HCT) 320-25 MG tablet TAKE 1 TABLET BY MOUTH DAILY   [DISCONTINUED] temazepam (RESTORIL) 30 MG capsule Take 1 capsule (30 mg total) by mouth at bedtime as needed. for sleep   No facility-administered encounter medications on file as of 10/03/2022.    Allergies  Allergen Reactions   Ace Inhibitors     REACTION: cough   Sulfa Antibiotics Other (See Comments)    unk   Sulfamethoxazole-Trimethoprim     REACTION: unspecified    Review of Systems  Constitutional: Negative.   HENT: Negative.    Eyes: Negative.  Negative for photophobia and visual disturbance.  Respiratory: Negative.    Cardiovascular: Negative.   Gastrointestinal: Negative.   Endocrine: Negative.   Genitourinary: Negative.  Negative for decreased urine volume.  Musculoskeletal: Negative.   Skin: Negative.   Allergic/Immunologic: Negative.   Neurological:  Positive for dizziness, syncope and light-headedness. Negative for tremors, seizures, facial asymmetry, speech difficulty, weakness, numbness and headaches.  Hematological: Negative.   Psychiatric/Behavioral:  Negative for confusion, self-injury, sleep disturbance and suicidal ideas.   All other systems reviewed and are negative.       Objective:  BP (!) 152/99   Pulse 91   Temp 97.8 F (36.6 C) (Temporal)   Ht 6\' 2"  (1.88 m)   Wt 297 lb 3.2 oz (134.8 kg)   SpO2 96%   BMI 38.16 kg/m    Wt Readings from Last 3 Encounters:  10/03/22 297 lb 3.2 oz (134.8 kg)  09/05/22 (!) 303 lb 12.8 oz (137.8 kg)  05/22/18 287 lb 2 oz (130.2 kg)    Physical Exam Vitals and nursing note reviewed.  Constitutional:      General: He is not in acute distress.     Appearance: Normal appearance. He is well-developed and well-groomed. He is morbidly obese. He is not ill-appearing, toxic-appearing or diaphoretic.  HENT:     Head: Normocephalic and atraumatic.     Jaw: There is normal jaw occlusion.     Right Ear: Hearing normal.     Left Ear: Hearing normal.     Nose: Nose normal.     Mouth/Throat:     Lips: Pink.     Mouth: Mucous membranes are moist.     Pharynx: Uvula midline.  Eyes:     General: Lids are normal.     Conjunctiva/sclera: Conjunctivae normal.     Pupils: Pupils are equal, round, and reactive to light.  Neck:     Thyroid: No thyroid mass, thyromegaly or thyroid tenderness.     Vascular: No carotid bruit or JVD.     Trachea: Trachea and phonation normal.  Cardiovascular:     Rate and Rhythm: Normal rate and regular rhythm.     Chest Wall: PMI is not displaced.     Pulses: Normal pulses.     Heart sounds: Normal heart sounds. No murmur heard.    No friction rub. No gallop.  Pulmonary:     Effort: Pulmonary effort is normal. No respiratory distress.     Breath sounds: Normal breath sounds. No wheezing.  Abdominal:     General: Bowel sounds are normal. There is no abdominal bruit.     Palpations: Abdomen is soft. There is no hepatomegaly or splenomegaly.  Musculoskeletal:        General: Normal range of motion.     Cervical back: Normal range of motion and neck supple.     Right lower leg: No edema.     Left lower leg: No edema.  Lymphadenopathy:     Cervical: No cervical adenopathy.  Skin:    General: Skin is warm and dry.     Capillary Refill: Capillary refill takes less than 2 seconds.     Coloration: Skin is not cyanotic, jaundiced or pale.     Findings: No rash.  Neurological:     General: No focal deficit present.     Mental Status: He is alert and oriented to person, place, and time.     GCS: GCS eye subscore is 4. GCS verbal subscore is 5. GCS motor subscore is 6.     Cranial Nerves: No cranial nerve deficit.      Sensory: Sensation is intact.     Motor: Motor function is intact.     Coordination: Coordination is intact.     Gait: Gait is intact.     Deep Tendon Reflexes: Reflexes are normal and symmetric.  Psychiatric:        Attention and Perception: Attention and perception normal.        Mood and Affect: Mood and affect normal.        Speech: Speech normal.        Behavior: Behavior normal. Behavior is cooperative.        Thought Content: Thought content normal.        Cognition and Memory: Cognition and memory normal.        Judgment: Judgment normal.     Results for orders placed or performed in visit on 09/05/22  CBC with Differential/Platelet  Result Value Ref Range   WBC 6.5 3.4 - 10.8 x10E3/uL   RBC 5.77 4.14 - 5.80 x10E6/uL   Hemoglobin 17.6 13.0 - 17.7 g/dL   Hematocrit 16.1 (H) 09.6 - 51.0 %   MCV 91 79 - 97 fL   MCH 30.5 26.6 - 33.0 pg   MCHC 33.7 31.5 - 35.7 g/dL   RDW 04.5 40.9 - 81.1 %   Platelets 218 150 - 450 x10E3/uL   Neutrophils 59 Not Estab. %   Lymphs 33 Not Estab. %   Monocytes 5 Not Estab. %   Eos 2 Not Estab. %   Basos 1 Not Estab. %   Neutrophils Absolute 3.8 1.4 - 7.0 x10E3/uL  Lymphocytes Absolute 2.1 0.7 - 3.1 x10E3/uL   Monocytes Absolute 0.4 0.1 - 0.9 x10E3/uL   EOS (ABSOLUTE) 0.1 0.0 - 0.4 x10E3/uL   Basophils Absolute 0.0 0.0 - 0.2 x10E3/uL   Immature Granulocytes 0 Not Estab. %   Immature Grans (Abs) 0.0 0.0 - 0.1 x10E3/uL  CMP14+EGFR  Result Value Ref Range   Glucose 325 (H) 70 - 99 mg/dL   BUN 15 6 - 24 mg/dL   Creatinine, Ser 6.21 (H) 0.76 - 1.27 mg/dL   eGFR 62 >30 QM/VHQ/4.69   BUN/Creatinine Ratio 11 9 - 20   Sodium 135 134 - 144 mmol/L   Potassium 4.2 3.5 - 5.2 mmol/L   Chloride 97 96 - 106 mmol/L   CO2 20 20 - 29 mmol/L   Calcium 9.5 8.7 - 10.2 mg/dL   Total Protein 7.5 6.0 - 8.5 g/dL   Albumin 4.5 3.8 - 4.9 g/dL   Globulin, Total 3.0 1.5 - 4.5 g/dL   Albumin/Globulin Ratio 1.5    Bilirubin Total 0.6 0.0 - 1.2 mg/dL    Alkaline Phosphatase 67 44 - 121 IU/L   AST 59 (H) 0 - 40 IU/L   ALT 79 (H) 0 - 44 IU/L  Bayer DCA Hb A1c Waived  Result Value Ref Range   HB A1C (BAYER DCA - WAIVED) 13.0 (H) 4.8 - 5.6 %  Thyroid Panel With TSH  Result Value Ref Range   TSH 1.570 0.450 - 4.500 uIU/mL   T4, Total 8.2 4.5 - 12.0 ug/dL   T3 Uptake Ratio 27 24 - 39 %   Free Thyroxine Index 2.2 1.2 - 4.9       Pertinent labs & imaging results that were available during my care of the patient were reviewed by me and considered in my medical decision making.  Assessment & Plan:  Cion was seen today for er follow up .  Diagnoses and all orders for this visit:  Hypertension associated with diabetes (HCC) -     CMP14+EGFR -     Continue Diovan 320/25mg  daily PO  -      Correg 25mg  PO daily -      Norvasc  5mg  daily  BP poorly controlled. Changes were not made in regimen today. Goal BP is 130/80. Pt aware to report any persistent high or low readings. DASH diet and exercise encouraged. Exercise at least 150 minutes per week and increase as tolerated. Goal BMI > 25. Stress management encouraged. Avoid nicotine and tobacco product use. Avoid excessive alcohol and NSAID's. Avoid more than 2000 mg of sodium daily. Medications as prescribed. Follow up as scheduled.   Near syncope -     LONG TERM MONITOR (3-14 DAYS); Future -     CMP14+EGFR -     US Carotid Duplex Bilateral; Future -     CBC with Differential/Platelet -     Continuous Glucose Sensor (FREESTYLE LIBRE 3 SENSOR) MISC; 1 each by Does not apply route 4 (four) times daily. Place 1 sensor on the skin every 14 days. Use to check glucose continuously -     Continuous Glucose Receiver (FREESTYLE LIBRE 3 READER) DEVI; 1 each by Does not apply route in the morning, at noon, in the evening, and at bedtime. -     CT HEAD WO CONTRAST ( ); Future -      Continue Correg PO at 25mg  BID  Type 2 diabetes mellitus with hyperglycemia, without long-term current use of insulin  (HCC) -  Continuous Glucose Sensor (FREESTYLE LIBRE 3 SENSOR) MISC; 1 each by Does not apply route 4 (four) times daily. Place 1 sensor on the skin every 14 days. Use to check glucose continuously -     Continuous Glucose Receiver (FREESTYLE LIBRE 3 READER) DEVI; 1 each by Does not apply route in the morning, at noon, in the evening, and at bedtime. -     Continue Farxiga 5mg  PO daily  Continue all other maintenance medications.  Follow up plan: Return if symptoms worsen or fail to improve.   Continue healthy lifestyle choices, including diet (rich in fruits, vegetables, and lean proteins, and low in salt and simple carbohydrates) and exercise (at least 30 minutes of moderate physical activity daily).   The above assessment and management plan was discussed with the patient. The patient verbalized understanding of and has agreed to the management plan. Patient is aware to call the clinic if they develop any new symptoms or if symptoms persist or worsen. Patient is aware when to return to the clinic for a follow-up visit. Patient educated on when it is appropriate to go to the emergency department.   Maryelizabeth Kaufmann NP student [facility] [phone]  I personally was present during the history, physical exam, and medical decision-making activities of this visit and have verified that the services and findings are accurately documented in the nurse practitioner student's note.  Kari Baars, FNP-C [facility] [address] [place], [place] [address] [phone]

## 2022-10-04 ENCOUNTER — Other Ambulatory Visit: Payer: Self-pay

## 2022-10-04 DIAGNOSIS — E1165 Type 2 diabetes mellitus with hyperglycemia: Secondary | ICD-10-CM

## 2022-10-04 DIAGNOSIS — F4320 Adjustment disorder, unspecified: Secondary | ICD-10-CM | POA: Diagnosis not present

## 2022-10-04 DIAGNOSIS — R55 Syncope and collapse: Secondary | ICD-10-CM

## 2022-10-04 LAB — CBC WITH DIFFERENTIAL/PLATELET
Basophils Absolute: 0.1 10*3/uL (ref 0.0–0.2)
Basos: 1 %
EOS (ABSOLUTE): 0.1 10*3/uL (ref 0.0–0.4)
Eos: 2 %
Hematocrit: 53.9 % — ABNORMAL HIGH (ref 37.5–51.0)
Hemoglobin: 18.3 g/dL — ABNORMAL HIGH (ref 13.0–17.7)
Immature Grans (Abs): 0 10*3/uL (ref 0.0–0.1)
Immature Granulocytes: 0 %
Lymphocytes Absolute: 2.4 10*3/uL (ref 0.7–3.1)
Lymphs: 37 %
MCH: 30.3 pg (ref 26.6–33.0)
MCHC: 34 g/dL (ref 31.5–35.7)
MCV: 89 fL (ref 79–97)
Monocytes Absolute: 0.5 10*3/uL (ref 0.1–0.9)
Monocytes: 8 %
Neutrophils Absolute: 3.5 10*3/uL (ref 1.4–7.0)
Neutrophils: 52 %
Platelets: 254 10*3/uL (ref 150–450)
RBC: 6.03 x10E6/uL — ABNORMAL HIGH (ref 4.14–5.80)
RDW: 13.3 % (ref 11.6–15.4)
WBC: 6.6 10*3/uL (ref 3.4–10.8)

## 2022-10-04 LAB — CMP14+EGFR
ALT: 67 IU/L — ABNORMAL HIGH (ref 0–44)
AST: 63 IU/L — ABNORMAL HIGH (ref 0–40)
Albumin: 4.4 g/dL (ref 3.8–4.9)
Alkaline Phosphatase: 66 IU/L (ref 44–121)
BUN/Creatinine Ratio: 14 (ref 9–20)
BUN: 21 mg/dL (ref 6–24)
Bilirubin Total: 0.7 mg/dL (ref 0.0–1.2)
CO2: 22 mmol/L (ref 20–29)
Calcium: 9.8 mg/dL (ref 8.7–10.2)
Chloride: 101 mmol/L (ref 96–106)
Creatinine, Ser: 1.46 mg/dL — ABNORMAL HIGH (ref 0.76–1.27)
Globulin, Total: 3.3 g/dL (ref 1.5–4.5)
Glucose: 152 mg/dL — ABNORMAL HIGH (ref 70–99)
Potassium: 3.9 mmol/L (ref 3.5–5.2)
Sodium: 140 mmol/L (ref 134–144)
Total Protein: 7.7 g/dL (ref 6.0–8.5)
eGFR: 56 mL/min/{1.73_m2} — ABNORMAL LOW (ref >59)

## 2022-10-04 MED ORDER — FREESTYLE LIBRE 3 SENSOR MISC
1.0000 | Freq: Four times a day (QID) | 11 refills | Status: DC
Start: 2022-10-04 — End: 2023-04-23

## 2022-10-04 MED ORDER — FREESTYLE LIBRE 3 READER DEVI
1.0000 | Freq: Four times a day (QID) | 3 refills | Status: AC
Start: 2022-10-04 — End: ?

## 2022-10-05 ENCOUNTER — Encounter: Payer: Self-pay | Admitting: Family Medicine

## 2022-10-05 ENCOUNTER — Ambulatory Visit: Payer: BC Managed Care – PPO | Admitting: Orthopaedic Surgery

## 2022-10-05 ENCOUNTER — Ambulatory Visit (HOSPITAL_BASED_OUTPATIENT_CLINIC_OR_DEPARTMENT_OTHER)
Admission: RE | Admit: 2022-10-05 | Discharge: 2022-10-05 | Disposition: A | Discharge status: Home / Self Care | Payer: BC Managed Care – PPO | Source: Ambulatory Visit | Attending: Family Medicine | Admitting: Family Medicine

## 2022-10-05 ENCOUNTER — Encounter: Payer: Self-pay | Admitting: Orthopaedic Surgery

## 2022-10-05 DIAGNOSIS — G8929 Other chronic pain: Secondary | ICD-10-CM

## 2022-10-05 DIAGNOSIS — M25562 Pain in left knee: Secondary | ICD-10-CM

## 2022-10-05 DIAGNOSIS — R55 Syncope and collapse: Secondary | ICD-10-CM | POA: Diagnosis not present

## 2022-10-05 DIAGNOSIS — I6523 Occlusion and stenosis of bilateral carotid arteries: Secondary | ICD-10-CM | POA: Diagnosis not present

## 2022-10-05 NOTE — Addendum Note (Signed)
Addended by: Lorelee Cover C on: 10/05/2022 02:26 PM   Modules accepted: Orders

## 2022-10-05 NOTE — Progress Notes (Signed)
Zachary [name] comes in today to evaluate and treat his left knee that has a significant effusion.  He actually saw the PA [place] last week and got x-rays of the knee showing just some mild arthritic changes.  However, his hemoglobin A1c was 13 which is significantly high and precluded him having any type of steroid injection in that knee.  He has been on Mobic and that is helped some he denies any specific injuries.  He is 57 years old and is overweight.  Examination of his left knee today does show a significant effusion.  I was able to aspirate 60 cc of fluid from his knee after placing lidocaine in the knee joint.  This did give him some relief.  He will continue his Mobic.  I will see him back in about 5 weeks from now after he has been working on better blood glucose control to consider a steroid injection in his knee at that visit.  I did review the x-rays of his knees showing only mild to moderate arthritis.  All question concerns were answered and addressed.

## 2022-10-06 ENCOUNTER — Ambulatory Visit (HOSPITAL_COMMUNITY)
Admission: RE | Admit: 2022-10-06 | Discharge: 2022-10-06 | Disposition: A | Discharge status: Home / Self Care | Payer: BC Managed Care – PPO | Source: Ambulatory Visit | Attending: Family Medicine | Admitting: Family Medicine

## 2022-10-06 DIAGNOSIS — R55 Syncope and collapse: Secondary | ICD-10-CM | POA: Diagnosis not present

## 2022-10-06 DIAGNOSIS — M479 Spondylosis, unspecified: Secondary | ICD-10-CM | POA: Diagnosis not present

## 2022-10-06 DIAGNOSIS — R591 Generalized enlarged lymph nodes: Secondary | ICD-10-CM | POA: Insufficient documentation

## 2022-10-06 DIAGNOSIS — R599 Enlarged lymph nodes, unspecified: Secondary | ICD-10-CM | POA: Diagnosis not present

## 2022-10-06 MED ORDER — IOHEXOL 300 MG/ML  SOLN
75.0000 mL | Freq: Once | INTRAMUSCULAR | Status: AC | PRN
  Administered 2022-10-06: 75 mL via INTRAVENOUS

## 2022-10-06 NOTE — Addendum Note (Signed)
Addended by: Sonny Masters on: 10/06/2022 09:09 AM   Modules accepted: Orders

## 2022-10-10 ENCOUNTER — Encounter: Payer: Self-pay | Admitting: Family Medicine

## 2022-10-11 DIAGNOSIS — F4320 Adjustment disorder, unspecified: Secondary | ICD-10-CM | POA: Diagnosis not present

## 2022-10-12 DIAGNOSIS — F4320 Adjustment disorder, unspecified: Secondary | ICD-10-CM | POA: Diagnosis not present

## 2022-10-17 ENCOUNTER — Other Ambulatory Visit: Payer: Self-pay

## 2022-10-17 ENCOUNTER — Ambulatory Visit: Payer: BC Managed Care – PPO | Admitting: Orthopaedic Surgery

## 2022-10-17 ENCOUNTER — Other Ambulatory Visit: Payer: BC Managed Care – PPO

## 2022-10-17 DIAGNOSIS — R7989 Other specified abnormal findings of blood chemistry: Secondary | ICD-10-CM

## 2022-10-17 DIAGNOSIS — F4320 Adjustment disorder, unspecified: Secondary | ICD-10-CM | POA: Diagnosis not present

## 2022-10-17 DIAGNOSIS — D582 Other hemoglobinopathies: Secondary | ICD-10-CM | POA: Diagnosis not present

## 2022-10-17 LAB — CBC WITH DIFFERENTIAL/PLATELET
Basophils Absolute: 0.1 10*3/uL (ref 0.0–0.2)
EOS (ABSOLUTE): 0.1 10*3/uL (ref 0.0–0.4)
Hemoglobin: 17.8 g/dL — ABNORMAL HIGH (ref 13.0–17.7)
Immature Granulocytes: 0 %
Lymphocytes Absolute: 2.2 10*3/uL (ref 0.7–3.1)
MCH: 29.9 pg (ref 26.6–33.0)
MCHC: 34 g/dL (ref 31.5–35.7)
Neutrophils Absolute: 5 10*3/uL (ref 1.4–7.0)
Neutrophils: 62 %
RBC: 5.96 x10E6/uL — ABNORMAL HIGH (ref 4.14–5.80)
RDW: 12.8 % (ref 11.6–15.4)

## 2022-10-17 LAB — CMP14+EGFR
Chloride: 99 mmol/L (ref 96–106)
Sodium: 138 mmol/L (ref 134–144)

## 2022-10-18 ENCOUNTER — Encounter: Payer: Self-pay | Admitting: Family Medicine

## 2022-10-18 ENCOUNTER — Other Ambulatory Visit: Payer: Self-pay | Admitting: Family Medicine

## 2022-10-18 DIAGNOSIS — N183 Chronic kidney disease, stage 3 unspecified: Secondary | ICD-10-CM

## 2022-10-18 DIAGNOSIS — I152 Hypertension secondary to endocrine disorders: Secondary | ICD-10-CM

## 2022-10-18 DIAGNOSIS — F4320 Adjustment disorder, unspecified: Secondary | ICD-10-CM | POA: Diagnosis not present

## 2022-10-18 LAB — CMP14+EGFR
BUN/Creatinine Ratio: 15 (ref 9–20)
Bilirubin Total: 0.7 mg/dL (ref 0.0–1.2)
Calcium: 9.7 mg/dL (ref 8.7–10.2)
Creatinine, Ser: 1.64 mg/dL — ABNORMAL HIGH (ref 0.76–1.27)
Glucose: 151 mg/dL — ABNORMAL HIGH (ref 70–99)
Potassium: 4.4 mmol/L (ref 3.5–5.2)
Total Protein: 7.8 g/dL (ref 6.0–8.5)
eGFR: 49 mL/min/{1.73_m2} — ABNORMAL LOW (ref >59)

## 2022-10-18 LAB — CBC WITH DIFFERENTIAL/PLATELET
Basos: 1 %
Eos: 2 %
Hematocrit: 52.4 % — ABNORMAL HIGH (ref 37.5–51.0)
Immature Grans (Abs): 0 10*3/uL (ref 0.0–0.1)
Lymphs: 28 %
MCV: 88 fL (ref 79–97)
Monocytes Absolute: 0.6 10*3/uL (ref 0.1–0.9)
Monocytes: 7 %
Platelets: 245 10*3/uL (ref 150–450)
WBC: 8 10*3/uL (ref 3.4–10.8)

## 2022-10-18 MED ORDER — VALSARTAN 320 MG PO TABS: 320.0000 mg | ORAL_TABLET | Freq: Every day | ORAL | 3 refills | Status: DC

## 2022-10-18 MED ORDER — DAPAGLIFLOZIN PROPANEDIOL 10 MG PO TABS: 10.0000 mg | ORAL_TABLET | Freq: Every day | ORAL | 1 refills | Status: DC

## 2022-10-18 NOTE — Progress Notes (Signed)
Creatinine elevated, stop mobic and hydrochlorothiazide. Increase Farxiga to 10 mg. Repeat labs in 4 weeks.

## 2022-10-22 ENCOUNTER — Encounter: Payer: Self-pay | Admitting: Family Medicine

## 2022-10-24 DIAGNOSIS — R55 Syncope and collapse: Secondary | ICD-10-CM | POA: Diagnosis not present

## 2022-10-26 ENCOUNTER — Encounter: Payer: Self-pay | Admitting: Family Medicine

## 2022-10-26 DIAGNOSIS — F4320 Adjustment disorder, unspecified: Secondary | ICD-10-CM | POA: Diagnosis not present

## 2022-10-27 ENCOUNTER — Other Ambulatory Visit: Payer: Self-pay | Admitting: Family Medicine

## 2022-10-27 DIAGNOSIS — R55 Syncope and collapse: Secondary | ICD-10-CM

## 2022-10-27 DIAGNOSIS — I498 Other specified cardiac arrhythmias: Secondary | ICD-10-CM

## 2022-10-27 DIAGNOSIS — F4320 Adjustment disorder, unspecified: Secondary | ICD-10-CM | POA: Diagnosis not present

## 2022-10-29 ENCOUNTER — Encounter: Payer: Self-pay | Admitting: Family Medicine

## 2022-10-31 DIAGNOSIS — F4320 Adjustment disorder, unspecified: Secondary | ICD-10-CM | POA: Diagnosis not present

## 2022-11-02 ENCOUNTER — Encounter: Payer: Self-pay | Admitting: Family Medicine

## 2022-11-02 DIAGNOSIS — E1159 Type 2 diabetes mellitus with other circulatory complications: Secondary | ICD-10-CM

## 2022-11-03 MED ORDER — CARVEDILOL 25 MG PO TABS
25.0000 mg | ORAL_TABLET | Freq: Two times a day (BID) | ORAL | 1 refills | Status: DC
Start: 2022-11-03 — End: 2023-01-11

## 2022-11-03 MED ORDER — AMLODIPINE BESYLATE 5 MG PO TABS: 5.0000 mg | ORAL_TABLET | Freq: Every day | ORAL | 1 refills | Status: DC

## 2022-11-03 MED ORDER — ROSUVASTATIN CALCIUM 20 MG PO TABS: 20.0000 mg | ORAL_TABLET | Freq: Every day | ORAL | 1 refills | Status: DC

## 2022-11-05 ENCOUNTER — Encounter: Payer: Self-pay | Admitting: Family Medicine

## 2022-11-08 DIAGNOSIS — F4320 Adjustment disorder, unspecified: Secondary | ICD-10-CM | POA: Diagnosis not present

## 2022-11-09 ENCOUNTER — Encounter: Payer: Self-pay | Admitting: Orthopaedic Surgery

## 2022-11-09 ENCOUNTER — Ambulatory Visit: Payer: BC Managed Care – PPO | Admitting: Orthopaedic Surgery

## 2022-11-09 ENCOUNTER — Encounter: Payer: Self-pay | Admitting: Family Medicine

## 2022-11-09 DIAGNOSIS — F4320 Adjustment disorder, unspecified: Secondary | ICD-10-CM | POA: Diagnosis not present

## 2022-11-09 DIAGNOSIS — G8929 Other chronic pain: Secondary | ICD-10-CM | POA: Diagnosis not present

## 2022-11-09 DIAGNOSIS — M25562 Pain in left knee: Secondary | ICD-10-CM

## 2022-11-09 MED ORDER — METHYLPREDNISOLONE ACETATE 40 MG/ML IJ SUSP
40.0000 mg | INTRAMUSCULAR | Status: AC | PRN
Start: 2022-11-09 — End: 2022-11-09
  Administered 2022-11-09: 40 mg via INTRA_ARTICULAR

## 2022-11-09 MED ORDER — LIDOCAINE HCL 1 % IJ SOLN
3.0000 mL | INTRAMUSCULAR | Status: AC | PRN
Start: 2022-11-09 — End: 2022-11-09
  Administered 2022-11-09: 3 mL

## 2022-11-09 NOTE — Progress Notes (Signed)
Zachary [name] is well-known to me.  I have known him since childhood.  He comes in today for continued valuation of his left knee.  When I saw him last month his hemoglobin A1c was 12.9.  He is now on a good blood glucose regimen.  He is also on Mounjaro.  He is lost about 30 pounds.  His daily blood glucose runs just above 100.  His left knee still is warm.  There is a mild effusion today.  We did place a steroid injection in his knee today and both of his agree this is worth trying.  His previous x-rays showed a well maintained joint space with that left knee.  I would like to see him back in a month to see how he is responding to the steroid injection.  I would like to repeat standing AP and lateral of his left knee at that visit and we may end up considering a MRI depending on the response of his knee to the steroid injection.  All questions and concerns were answered addressed.  He knows to watch his blood glucose closely due to the fact that steroid injections can increase his blood glucose.     Procedure Note  Patient: Zachary [name]             Date of Birth: [birth year]           MRN: [id number]             Visit Date: 11/09/2022  Procedures: Visit Diagnoses:  1. Chronic pain of left knee     Large Joint Inj on 11/09/2022 4:03 PM Indications: diagnostic evaluation and pain Details: 22 G 1.5 in needle, superolateral approach  Arthrogram: No  Medications: 3 mL lidocaine 1 %; 40 mg methylPREDNISolone acetate 40 MG/ML Outcome: tolerated well, no immediate complications Procedure, treatment alternatives, risks and benefits explained, specific risks discussed. Consent was given by the patient. Immediately prior to procedure a time out was called to verify the correct patient, procedure, equipment, support staff and site/side marked as required. Patient was prepped and draped in the usual sterile fashion.

## 2022-11-10 ENCOUNTER — Encounter: Payer: Self-pay | Admitting: Orthopaedic Surgery

## 2022-11-12 ENCOUNTER — Encounter: Payer: Self-pay | Admitting: Family Medicine

## 2022-11-12 ENCOUNTER — Encounter: Payer: Self-pay | Admitting: Orthopaedic Surgery

## 2022-11-13 ENCOUNTER — Other Ambulatory Visit: Payer: Self-pay | Admitting: Family Medicine

## 2022-11-13 DIAGNOSIS — N521 Erectile dysfunction due to diseases classified elsewhere: Secondary | ICD-10-CM

## 2022-11-13 DIAGNOSIS — K219 Gastro-esophageal reflux disease without esophagitis: Secondary | ICD-10-CM

## 2022-11-13 MED ORDER — FAMOTIDINE 20 MG PO TABS: 20.0000 mg | ORAL_TABLET | Freq: Two times a day (BID) | ORAL | 2 refills | Status: DC

## 2022-11-13 MED ORDER — TADALAFIL 20 MG PO TABS
20.0000 mg | ORAL_TABLET | ORAL | 2 refills | Status: DC | PRN
Start: 2022-11-13 — End: 2023-07-19

## 2022-11-16 DIAGNOSIS — F4320 Adjustment disorder, unspecified: Secondary | ICD-10-CM | POA: Diagnosis not present

## 2022-11-17 DIAGNOSIS — F4320 Adjustment disorder, unspecified: Secondary | ICD-10-CM | POA: Diagnosis not present

## 2022-11-18 ENCOUNTER — Encounter: Payer: Self-pay | Admitting: Orthopaedic Surgery

## 2022-11-18 DIAGNOSIS — G8929 Other chronic pain: Secondary | ICD-10-CM

## 2022-11-19 ENCOUNTER — Encounter: Payer: Self-pay | Admitting: Family Medicine

## 2022-11-20 NOTE — Telephone Encounter (Signed)
Mri placed in chart

## 2022-11-23 DIAGNOSIS — F4320 Adjustment disorder, unspecified: Secondary | ICD-10-CM | POA: Diagnosis not present

## 2022-11-24 DIAGNOSIS — F4320 Adjustment disorder, unspecified: Secondary | ICD-10-CM | POA: Diagnosis not present

## 2022-11-25 ENCOUNTER — Encounter: Payer: Self-pay | Admitting: Family Medicine

## 2022-11-30 DIAGNOSIS — F4320 Adjustment disorder, unspecified: Secondary | ICD-10-CM | POA: Diagnosis not present

## 2022-12-01 DIAGNOSIS — F4320 Adjustment disorder, unspecified: Secondary | ICD-10-CM | POA: Diagnosis not present

## 2022-12-02 ENCOUNTER — Encounter: Payer: Self-pay | Admitting: Orthopaedic Surgery

## 2022-12-02 ENCOUNTER — Ambulatory Visit
Admission: RE | Admit: 2022-12-02 | Discharge: 2022-12-02 | Disposition: A | Discharge status: Home / Self Care | Payer: BC Managed Care – PPO | Source: Ambulatory Visit | Attending: Orthopaedic Surgery | Admitting: Orthopaedic Surgery

## 2022-12-02 ENCOUNTER — Encounter: Payer: Self-pay | Admitting: Family Medicine

## 2022-12-02 DIAGNOSIS — G8929 Other chronic pain: Secondary | ICD-10-CM

## 2022-12-02 DIAGNOSIS — M25562 Pain in left knee: Secondary | ICD-10-CM | POA: Diagnosis not present

## 2022-12-02 DIAGNOSIS — R600 Localized edema: Secondary | ICD-10-CM | POA: Diagnosis not present

## 2022-12-02 DIAGNOSIS — M25462 Effusion, left knee: Secondary | ICD-10-CM | POA: Diagnosis not present

## 2022-12-04 DIAGNOSIS — F4320 Adjustment disorder, unspecified: Secondary | ICD-10-CM | POA: Diagnosis not present

## 2022-12-07 ENCOUNTER — Encounter: Payer: Self-pay | Admitting: Family Medicine

## 2022-12-07 ENCOUNTER — Other Ambulatory Visit: Payer: Self-pay | Admitting: Family Medicine

## 2022-12-07 DIAGNOSIS — F339 Major depressive disorder, recurrent, unspecified: Secondary | ICD-10-CM

## 2022-12-07 DIAGNOSIS — F900 Attention-deficit hyperactivity disorder, predominantly inattentive type: Secondary | ICD-10-CM

## 2022-12-07 DIAGNOSIS — F4320 Adjustment disorder, unspecified: Secondary | ICD-10-CM | POA: Diagnosis not present

## 2022-12-07 MED ORDER — BUPROPION HCL ER (XL) 150 MG PO TB24
150.0000 mg | ORAL_TABLET | Freq: Every day | ORAL | 1 refills | Status: DC
Start: 2022-12-07 — End: 2023-01-30

## 2022-12-08 DIAGNOSIS — F4320 Adjustment disorder, unspecified: Secondary | ICD-10-CM | POA: Diagnosis not present

## 2022-12-10 ENCOUNTER — Encounter: Payer: Self-pay | Admitting: Family Medicine

## 2022-12-11 ENCOUNTER — Encounter: Payer: Self-pay | Admitting: Orthopaedic Surgery

## 2022-12-11 ENCOUNTER — Ambulatory Visit (INDEPENDENT_AMBULATORY_CARE_PROVIDER_SITE_OTHER): Payer: BC Managed Care – PPO | Admitting: Orthopaedic Surgery

## 2022-12-11 ENCOUNTER — Encounter: Payer: Self-pay | Admitting: Family Medicine

## 2022-12-11 DIAGNOSIS — M1712 Unilateral primary osteoarthritis, left knee: Secondary | ICD-10-CM | POA: Diagnosis not present

## 2022-12-11 DIAGNOSIS — M25562 Pain in left knee: Secondary | ICD-10-CM

## 2022-12-11 NOTE — Progress Notes (Signed)
Zachary [name] comes in today to go over MRI of his left knee.  He continues to have recurrent effusions of that left knee.  We have aspirated it twice and placed a steroid injection in his left knee.  He continues to lose weight and work on blood glucose control.  He does have a recurrent effusion of his left knee today which is mild to moderate.  He has good range of motion of that knee and it feels limply stable.  MRI results were reviewed with him.  We looked at the images as well as the radiology report.  There is some signal changes in the medial meniscus at the posterior horn which are more degenerative but no tear at all.  He has mild to moderate cartilage thinning in the knee but no full-thickness cartilage deficits.  There is edema in Hoffa's fat pad and there is an effusion.  His ACL and PCL as well as the other ligaments are intact.  At this point he is appropriate candidate for hyaluronic acid to treat the pain from osteoarthritis of his knee.  He agrees with this treatment plan.  He has failed other conservative treatments including steroid injections.  I explained the rationale behind viscosupplementation and we will see if we can get this approved and ordered for his left knee as a neck step.  At that visit we can aspirate his knee and then place hyaluronic acid in that left knee.  This patient is diagnosed with osteoarthritis of the knee(s).    Radiographs show evidence of joint space narrowing, osteophytes, subchondral sclerosis and/or subchondral cysts.  This patient has knee pain which interferes with functional and activities of daily living.    This patient has experienced inadequate response, adverse effects and/or intolerance with conservative treatments such as acetaminophen, NSAIDS, topical creams, physical therapy or regular exercise, knee bracing and/or weight loss.   This patient has experienced inadequate response or has a contraindication to intra articular steroid injections for  at least 3 months.   This patient is not scheduled to have a total knee replacement within 6 months of starting treatment with viscosupplementation.

## 2022-12-12 ENCOUNTER — Telehealth: Payer: Self-pay

## 2022-12-12 ENCOUNTER — Encounter: Payer: Self-pay | Admitting: Family Medicine

## 2022-12-12 NOTE — Telephone Encounter (Signed)
Left knee gel injection ?

## 2022-12-14 DIAGNOSIS — F4320 Adjustment disorder, unspecified: Secondary | ICD-10-CM | POA: Diagnosis not present

## 2022-12-14 NOTE — Telephone Encounter (Signed)
VOB submitted for Orthovisc, left knee

## 2022-12-15 DIAGNOSIS — F4320 Adjustment disorder, unspecified: Secondary | ICD-10-CM | POA: Diagnosis not present

## 2022-12-17 ENCOUNTER — Encounter: Payer: Self-pay | Admitting: Family Medicine

## 2022-12-19 DIAGNOSIS — F4320 Adjustment disorder, unspecified: Secondary | ICD-10-CM | POA: Diagnosis not present

## 2022-12-21 ENCOUNTER — Telehealth: Payer: Self-pay

## 2022-12-21 DIAGNOSIS — F4320 Adjustment disorder, unspecified: Secondary | ICD-10-CM | POA: Diagnosis not present

## 2022-12-21 NOTE — Telephone Encounter (Signed)
Faxed completed PA form to [employer] at [phone] for Orthovisc, left knee. PA pending

## 2022-12-24 ENCOUNTER — Encounter: Payer: Self-pay | Admitting: Family Medicine

## 2022-12-25 DIAGNOSIS — F4320 Adjustment disorder, unspecified: Secondary | ICD-10-CM | POA: Diagnosis not present

## 2022-12-27 ENCOUNTER — Encounter: Payer: Self-pay | Admitting: Orthopaedic Surgery

## 2022-12-28 ENCOUNTER — Other Ambulatory Visit: Payer: Self-pay

## 2022-12-28 ENCOUNTER — Telehealth: Payer: Self-pay

## 2022-12-28 DIAGNOSIS — M1712 Unilateral primary osteoarthritis, left knee: Secondary | ICD-10-CM

## 2022-12-28 DIAGNOSIS — F4320 Adjustment disorder, unspecified: Secondary | ICD-10-CM | POA: Diagnosis not present

## 2022-12-28 NOTE — Telephone Encounter (Signed)
Talked with patient and appointments have been scheduled for gel injection. ? ?

## 2022-12-29 ENCOUNTER — Other Ambulatory Visit: Payer: Self-pay | Admitting: Family Medicine

## 2022-12-29 ENCOUNTER — Encounter: Payer: Self-pay | Admitting: Family Medicine

## 2022-12-29 DIAGNOSIS — E1159 Type 2 diabetes mellitus with other circulatory complications: Secondary | ICD-10-CM

## 2022-12-29 MED ORDER — VALSARTAN 320 MG PO TABS
320.0000 mg | ORAL_TABLET | Freq: Every day | ORAL | 3 refills | Status: DC
Start: 2022-12-29 — End: 2022-12-29

## 2022-12-29 MED ORDER — VALSARTAN 320 MG PO TABS
320.0000 mg | ORAL_TABLET | Freq: Every day | ORAL | 3 refills | Status: DC
Start: 2022-12-29 — End: 2024-01-14

## 2022-12-31 ENCOUNTER — Encounter: Payer: Self-pay | Admitting: Family Medicine

## 2023-01-02 ENCOUNTER — Ambulatory Visit: Payer: BC Managed Care – PPO | Admitting: Family Medicine

## 2023-01-02 DIAGNOSIS — F4320 Adjustment disorder, unspecified: Secondary | ICD-10-CM | POA: Diagnosis not present

## 2023-01-03 DIAGNOSIS — F4320 Adjustment disorder, unspecified: Secondary | ICD-10-CM | POA: Diagnosis not present

## 2023-01-04 ENCOUNTER — Encounter: Payer: BC Managed Care – PPO | Admitting: Family Medicine

## 2023-01-04 DIAGNOSIS — F4320 Adjustment disorder, unspecified: Secondary | ICD-10-CM | POA: Diagnosis not present

## 2023-01-05 ENCOUNTER — Encounter: Payer: Self-pay | Admitting: Family Medicine

## 2023-01-07 ENCOUNTER — Encounter: Payer: Self-pay | Admitting: Family Medicine

## 2023-01-08 ENCOUNTER — Ambulatory Visit: Payer: BC Managed Care – PPO | Admitting: Physician Assistant

## 2023-01-11 ENCOUNTER — Encounter: Payer: Self-pay | Admitting: Orthopaedic Surgery

## 2023-01-11 ENCOUNTER — Ambulatory Visit: Payer: BC Managed Care – PPO | Admitting: Family Medicine

## 2023-01-11 ENCOUNTER — Encounter: Payer: Self-pay | Admitting: Family Medicine

## 2023-01-11 VITALS — BP 123/82 | HR 89 | Temp 97.8°F | Ht 74.0 in | Wt 245.8 lb

## 2023-01-11 DIAGNOSIS — E785 Hyperlipidemia, unspecified: Secondary | ICD-10-CM

## 2023-01-11 DIAGNOSIS — Z23 Encounter for immunization: Secondary | ICD-10-CM

## 2023-01-11 DIAGNOSIS — E1122 Type 2 diabetes mellitus with diabetic chronic kidney disease: Secondary | ICD-10-CM | POA: Diagnosis not present

## 2023-01-11 DIAGNOSIS — N183 Chronic kidney disease, stage 3 unspecified: Secondary | ICD-10-CM

## 2023-01-11 DIAGNOSIS — E1169 Type 2 diabetes mellitus with other specified complication: Secondary | ICD-10-CM

## 2023-01-11 DIAGNOSIS — G4701 Insomnia due to medical condition: Secondary | ICD-10-CM

## 2023-01-11 DIAGNOSIS — I152 Hypertension secondary to endocrine disorders: Secondary | ICD-10-CM | POA: Diagnosis not present

## 2023-01-11 DIAGNOSIS — F339 Major depressive disorder, recurrent, unspecified: Secondary | ICD-10-CM

## 2023-01-11 DIAGNOSIS — E1165 Type 2 diabetes mellitus with hyperglycemia: Secondary | ICD-10-CM | POA: Diagnosis not present

## 2023-01-11 DIAGNOSIS — F900 Attention-deficit hyperactivity disorder, predominantly inattentive type: Secondary | ICD-10-CM

## 2023-01-11 DIAGNOSIS — Z7984 Long term (current) use of oral hypoglycemic drugs: Secondary | ICD-10-CM

## 2023-01-11 DIAGNOSIS — F4320 Adjustment disorder, unspecified: Secondary | ICD-10-CM | POA: Diagnosis not present

## 2023-01-11 DIAGNOSIS — E1159 Type 2 diabetes mellitus with other circulatory complications: Secondary | ICD-10-CM | POA: Diagnosis not present

## 2023-01-11 LAB — BAYER DCA HB A1C WAIVED: HB A1C (BAYER DCA - WAIVED): 5.4 % (ref 4.8–5.6)

## 2023-01-11 NOTE — Progress Notes (Signed)
Subjective:  Patient ID: MARIANNE OW, male    DOB: May 23, 1965, 57 y.o.   MRN: [id number]  Patient Care Team: Sonny Masters, FNP as PCP - General (Family Medicine)   Chief Complaint:  Diabetes   HPI: KAILEE DOBMEIER is a 57 y.o. male presenting on 01/11/2023 for Diabetes   Discussed the use of AI scribe software for clinical note transcription with the patient, who gave verbal consent to proceed.  History of Present Illness   The patient, with a history of diabetes, hypertension, and obesity, presents with a significant improvement in his health status. He reports a remarkable decrease in his A1c from 13.1 to 5.4, which he attributes to strict dietary changes and weight loss. He has been adhering to a low-carb, no-sugar diet, consuming mostly fresh vegetables and lean proteins. He has also been monitoring his caloric intake, which has been consistently under 1000 calories per day. This has resulted in a significant weight loss, from a starting point of 310 lbs to a current weight of 240 lbs.  However, the patient has been experiencing insomnia, which he has been managing with 50mg  of Benadryl, but reports a 'Benadryl hangover' the following day. He has not tried other sleep aids such as Lunesta or melatonin.  In addition, the patient reports some weakness at times, which he attributes to his blood sugar levels dropping. He manages these episodes by eating something to raise his blood sugar levels.  The patient also has a history of knee problems, for which he is scheduled to receive gel injections. He had inquired about PRP therapy, but was advised that recent studies suggest it may not be as effective as previously thought.  The patient is currently on Farxiga, Mounjaro, and Carvedilol for his hypertension, but is considering reducing the Carvedilol due to the side effects. He is also on Wellbutrin for ADHD and is considering increasing the dosage.  The patient is highly motivated  and has been actively involved in his health management, regularly checking in with his healthcare provider and maintaining accountability for his health behaviors.          Relevant past medical, surgical, family, and social history reviewed and updated as indicated.  Allergies and medications reviewed and updated. Data reviewed: Chart in Epic.   Past Medical History:  Diagnosis Date   ADHD    Asthma    Binge eating disorder    Diabetes mellitus    GERD (gastroesophageal reflux disease)    Gout    Hyperlipidemia    Hypertension    Insomnia    Kidney stones    Low magnesium level    Vitamin D deficiency     Past Surgical History:  Procedure Laterality Date   WISDOM TOOTH EXTRACTION     WRIST SURGERY Right     Social History   Socioeconomic History   Marital status: Married    Spouse name: Not on file   Number of children: Not on file   Years of education: Not on file   Highest education level: Bachelor's degree (e.g., BA, AB, BS)  Occupational History   Not on file  Tobacco Use   Smoking status: Never   Smokeless tobacco: Never  Vaping Use   Vaping status: Never Used  Substance and Sexual Activity   Alcohol use: Yes    Comment: occ   Drug use: No   Sexual activity: Not Currently  Other Topics Concern   Not on file  Social  History Narrative   Lives with spouse and kids   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         Social Determinants of Health   Financial Resource Strain: Low Risk  (01/07/2023)   Overall Financial Resource Strain (CARDIA)    Difficulty of Paying Living Expenses: Not hard at all  Food Insecurity: No Food Insecurity (01/07/2023)   Hunger Vital Sign    Worried About Running Out of Food in the Last Year: Never true    Ran Out of Food in the Last Year: Never true  Transportation Needs: No Transportation Needs (01/07/2023)   PRAPARE - Administrator, Civil Service (Medical): No    Lack of Transportation  (Non-Medical): No  Physical Activity: Unknown (01/07/2023)   Exercise Vital Sign    Days of Exercise per Week: 0 days    Minutes of Exercise per Session: Not on file  Stress: Stress Concern Present (01/07/2023)   [employer] of Occupational Health - Occupational Stress Questionnaire    Feeling of Stress : To some extent  Social Connections: Moderately Integrated (01/07/2023)   Social Connection and Isolation Panel [NHANES]    Frequency of Communication with Friends and Family: Once a week    Frequency of Social Gatherings with Friends and Family: Never    Attends Religious Services: More than 4 times per year    Active Member of [employer] or Organizations: Yes    Attends Engineer, structural: More than 4 times per year    Marital Status: Married  Catering manager Violence: Not At Risk (01/31/2022)   Received from [employer], [facility]   HITS    Over the last 12 months how often did your partner physically hurt you?: 1    Over the last 12 months how often did your partner insult you or talk down to you?: 1    Over the last 12 months how often did your partner threaten you with physical harm?: 1    Over the last 12 months how often did your partner scream or curse at you?: 1    Outpatient Encounter Medications as of 01/11/2023  Medication Sig   albuterol (PROAIR HFA) 108 (90 BASE) MCG/ACT inhaler Inhale 2 puffs into the lungs every 4 (four) hours as needed for wheezing.   amLODipine (NORVASC) 5 MG tablet Take 1 tablet (5 mg total) by mouth daily.   aspirin 81 MG tablet Take 81 mg by mouth daily.    buPROPion (WELLBUTRIN XL) 150 MG 24 hr tablet Take 1 tablet (150 mg total) by mouth daily.   Continuous Glucose Receiver (FREESTYLE LIBRE 3 READER) DEVI 1 each by Does not apply route in the morning, at noon, in the evening, and at bedtime.   Continuous Glucose Sensor (FREESTYLE LIBRE 3 SENSOR) MISC 1 each by Does not apply route 4 (four) times daily. Place 1 sensor on the  skin every 14 days. Use to check glucose continuously   dapagliflozin propanediol (FARXIGA) 10 MG TABS tablet Take 10 mg by mouth daily.   famotidine (PEPCID) 20 MG tablet Take 1 tablet (20 mg total) by mouth 2 (two) times daily.   glucose blood (ONETOUCH VERIO) test strip 1 each by Other route 4 (four) times daily. And lancets 4/day   magnesium oxide (MAG-OX) 400 MG tablet Take 400 mg by mouth daily.   metroNIDAZOLE (METROGEL) 1 % gel Apply topically daily.   rosuvastatin (CRESTOR) 20 MG tablet Take 1 tablet (20  mg total) by mouth daily.   tadalafil (CIALIS) 20 MG tablet Take 1 tablet (20 mg total) by mouth every other day as needed for erectile dysfunction.   tirzepatide (MOUNJARO) 10 MG/0.5ML Pen Inject 10 mg into the skin once a week.   valsartan (DIOVAN) 320 MG tablet Take 1 tablet (320 mg total) by mouth daily.   [DISCONTINUED] carvedilol (COREG) 25 MG tablet Take 1 tablet (25 mg total) by mouth 2 (two) times daily with a meal.   No facility-administered encounter medications on file as of 01/11/2023.    Allergies  Allergen Reactions   Ace Inhibitors     REACTION: cough   Sulfa Antibiotics Other (See Comments)    unk   Sulfamethoxazole-Trimethoprim     REACTION: unspecified    Pertinent ROS per HPI, otherwise unremarkable      Objective:  BP 123/82   Pulse 89   Temp 97.8 F (36.6 C) (Temporal)   Ht 6\' 2"  (1.88 m)   Wt 245 lb 12.8 oz (111.5 kg)   SpO2 95%   BMI 31.56 kg/m    Wt Readings from Last 3 Encounters:  01/11/23 245 lb 12.8 oz (111.5 kg)  10/03/22 297 lb 3.2 oz (134.8 kg)  09/05/22 (!) 303 lb 12.8 oz (137.8 kg)    Physical Exam Vitals and nursing note reviewed.  Constitutional:      General: He is not in acute distress.    Appearance: Normal appearance. He is well-developed and well-groomed. He is obese. He is not ill-appearing, toxic-appearing or diaphoretic.  HENT:     Head: Normocephalic and atraumatic.     Jaw: There is normal jaw occlusion.      Right Ear: Hearing normal.     Left Ear: Hearing normal.     Nose: Nose normal.     Mouth/Throat:     Lips: Pink.     Mouth: Mucous membranes are moist.     Pharynx: Oropharynx is clear. Uvula midline.  Eyes:     General: Lids are normal.     Extraocular Movements: Extraocular movements intact.     Conjunctiva/sclera: Conjunctivae normal.     Pupils: Pupils are equal, round, and reactive to light.  Cardiovascular:     Rate and Rhythm: Normal rate.     Chest Wall: PMI is not displaced.  Pulmonary:     Effort: Pulmonary effort is normal. No respiratory distress.     Breath sounds: No wheezing.  Abdominal:     General: Bowel sounds are normal. There is no distension or abdominal bruit.     Palpations: Abdomen is soft. There is no hepatomegaly or splenomegaly.     Tenderness: There is no abdominal tenderness. There is no right CVA tenderness or left CVA tenderness.     Hernia: No hernia is present.  Musculoskeletal:     Right lower leg: No edema.     Left lower leg: No edema.  Skin:    General: Skin is warm and dry.     Capillary Refill: Capillary refill takes less than 2 seconds.     Coloration: Skin is not cyanotic, jaundiced or pale.     Findings: No rash.  Neurological:     General: No focal deficit present.     Mental Status: He is alert and oriented to person, place, and time.     Sensory: Sensation is intact.     Motor: Motor function is intact.     Coordination: Coordination is intact.  Gait: Gait is intact.     Deep Tendon Reflexes: Reflexes are normal and symmetric.  Psychiatric:        Attention and Perception: Attention and perception normal.        Mood and Affect: Mood and affect normal.        Speech: Speech normal.        Behavior: Behavior normal. Behavior is cooperative.        Thought Content: Thought content normal.        Cognition and Memory: Cognition and memory normal.        Judgment: Judgment normal.     Results for orders placed or performed  in visit on 10/17/22  CMP14+EGFR  Result Value Ref Range   Glucose 151 (H) 70 - 99 mg/dL   BUN 25 (H) 6 - 24 mg/dL   Creatinine, Ser 1.61 (H) 0.76 - 1.27 mg/dL   eGFR 49 (L) >09 UE/AVW/0.98   BUN/Creatinine Ratio 15 9 - 20   Sodium 138 134 - 144 mmol/L   Potassium 4.4 3.5 - 5.2 mmol/L   Chloride 99 96 - 106 mmol/L   CO2 21 20 - 29 mmol/L   Calcium 9.7 8.7 - 10.2 mg/dL   Total Protein 7.8 6.0 - 8.5 g/dL   Albumin 4.6 3.8 - 4.9 g/dL   Globulin, Total 3.2 1.5 - 4.5 g/dL   Bilirubin Total 0.7 0.0 - 1.2 mg/dL   Alkaline Phosphatase 66 44 - 121 IU/L   AST 38 0 - 40 IU/L   ALT 58 (H) 0 - 44 IU/L  CBC with Differential/Platelet  Result Value Ref Range   WBC 8.0 3.4 - 10.8 x10E3/uL   RBC 5.96 (H) 4.14 - 5.80 x10E6/uL   Hemoglobin 17.8 (H) 13.0 - 17.7 g/dL   Hematocrit 11.9 (H) 14.7 - 51.0 %   MCV 88 79 - 97 fL   MCH 29.9 26.6 - 33.0 pg   MCHC 34.0 31.5 - 35.7 g/dL   RDW 82.9 56.2 - 13.0 %   Platelets 245 150 - 450 x10E3/uL   Neutrophils 62 Not Estab. %   Lymphs 28 Not Estab. %   Monocytes 7 Not Estab. %   Eos 2 Not Estab. %   Basos 1 Not Estab. %   Neutrophils Absolute 5.0 1.4 - 7.0 x10E3/uL   Lymphocytes Absolute 2.2 0.7 - 3.1 x10E3/uL   Monocytes Absolute 0.6 0.1 - 0.9 x10E3/uL   EOS (ABSOLUTE) 0.1 0.0 - 0.4 x10E3/uL   Basophils Absolute 0.1 0.0 - 0.2 x10E3/uL   Immature Granulocytes 0 Not Estab. %   Immature Grans (Abs) 0.0 0.0 - 0.1 x10E3/uL       Pertinent labs & imaging results that were available during my care of the patient were reviewed by me and considered in my medical decision making.  Assessment & Plan:  Elwell Cammisa" was seen today for diabetes.  Diagnoses and all orders for this visit:  Type 2 diabetes mellitus with other specified complication, without long-term current use of insulin (HCC) -     CBC with Differential/Platelet -     CMP14+EGFR -     Bayer DCA Hb A1c Waived -     Microalbumin / creatinine urine ratio  Hypertension associated with  diabetes (HCC) -     CBC with Differential/Platelet -     CMP14+EGFR -     Microalbumin / creatinine urine ratio  Hyperlipidemia associated with type 2 diabetes mellitus (HCC) -     CMP14+EGFR  CKD stage 3 due to type 2 diabetes mellitus (HCC) -     CBC with Differential/Platelet -     CMP14+EGFR -     Microalbumin / creatinine urine ratio  Depression, recurrent (HCC)  Obesity, Class III, BMI 40-49.9 (morbid obesity) (HCC) -     CBC with Differential/Platelet -     CMP14+EGFR -     Bayer DCA Hb A1c Waived  ADHD (attention deficit hyperactivity disorder), inattentive type  Encounter for immunization -     Flu vaccine trivalent PF, 6mos and older(Flulaval,Afluria,Fluarix,Fluzone)  Insomnia due to medical condition     Assessment and Plan    Type 2 Diabetes Mellitus Significant improvement in glycemic control with A1c of 5.4, down from 13.1. Patient has been adhering to a strict diet and has lost significant weight. Reports occasional hypoglycemia. -Continue Farxiga and Jardiance. -Advise patient to monitor for hypoglycemia and eat a snack if feeling weak.  Hypertension Blood pressure well controlled, patient has lost significant weight. -Reduce Carvedilol to nighttime only for one week, then every other night for one week, then discontinue. Monitor heart rate and blood pressure.  Insomnia Patient reports difficulty sleeping. -Trial of Melatonin, starting at 3mg  quick acting and 10mg  long acting. If ineffective, consider prescription sleep aid.  Weight Loss Patient has lost significant weight and is considering further weight loss. Currently at 240lbs, down from 303lbs. -Encourage patient to continue with current diet and exercise regimen. A goal weight of 210-225lbs is reasonable given patient's progress and activity level.  Depression/ADHD Patient on Wellbutrin, considering increasing dose due to persistent ADHD symptoms. -Double current dose of Wellbutrin and monitor  for improvement.  General Health Maintenance -Administered influenza vaccine today. -Plan for physical exam and repeat A1c in January. -Obtain records of last colonoscopy from outside provider.          Continue all other maintenance medications.  Follow up plan: Return in about 3 months (around 04/13/2023) for CPE.   Continue healthy lifestyle choices, including diet (rich in fruits, vegetables, and lean proteins, and low in salt and simple carbohydrates) and exercise (at least 30 minutes of moderate physical activity daily).  Educational handout given for DM  The above assessment and management plan was discussed with the patient. The patient verbalized understanding of and has agreed to the management plan. Patient is aware to call the clinic if they develop any new symptoms or if symptoms persist or worsen. Patient is aware when to return to the clinic for a follow-up visit. Patient educated on when it is appropriate to go to the emergency department.   Kari Baars, FNP-C [facility] [phone]

## 2023-01-11 NOTE — Patient Instructions (Signed)

## 2023-01-12 ENCOUNTER — Encounter: Payer: Self-pay | Admitting: Family Medicine

## 2023-01-12 ENCOUNTER — Other Ambulatory Visit: Payer: Self-pay | Admitting: Family Medicine

## 2023-01-12 DIAGNOSIS — K760 Fatty (change of) liver, not elsewhere classified: Secondary | ICD-10-CM

## 2023-01-12 DIAGNOSIS — E1165 Type 2 diabetes mellitus with hyperglycemia: Secondary | ICD-10-CM

## 2023-01-12 DIAGNOSIS — F4320 Adjustment disorder, unspecified: Secondary | ICD-10-CM | POA: Diagnosis not present

## 2023-01-12 DIAGNOSIS — E1122 Type 2 diabetes mellitus with diabetic chronic kidney disease: Secondary | ICD-10-CM

## 2023-01-12 LAB — CMP14+EGFR
ALT: 17 [IU]/L (ref 0–44)
AST: 23 [IU]/L (ref 0–40)
Albumin: 4.5 g/dL (ref 3.8–4.9)
Alkaline Phosphatase: 70 [IU]/L (ref 44–121)
BUN/Creatinine Ratio: 13 (ref 9–20)
BUN: 23 mg/dL (ref 6–24)
Bilirubin Total: 0.8 mg/dL (ref 0.0–1.2)
CO2: 21 mmol/L (ref 20–29)
Calcium: 9.6 mg/dL (ref 8.7–10.2)
Chloride: 99 mmol/L (ref 96–106)
Creatinine, Ser: 1.76 mg/dL — ABNORMAL HIGH (ref 0.76–1.27)
Globulin, Total: 2.9 g/dL (ref 1.5–4.5)
Glucose: 83 mg/dL (ref 70–99)
Potassium: 4.4 mmol/L (ref 3.5–5.2)
Sodium: 139 mmol/L (ref 134–144)
Total Protein: 7.4 g/dL (ref 6.0–8.5)
eGFR: 45 mL/min/{1.73_m2} — ABNORMAL LOW (ref >59)

## 2023-01-12 LAB — CBC WITH DIFFERENTIAL/PLATELET
Basophils Absolute: 0 10*3/uL (ref 0.0–0.2)
Basos: 0 %
EOS (ABSOLUTE): 0.1 10*3/uL (ref 0.0–0.4)
Eos: 2 %
Hematocrit: 48.5 % (ref 37.5–51.0)
Hemoglobin: 16.5 g/dL (ref 13.0–17.7)
Immature Grans (Abs): 0 10*3/uL (ref 0.0–0.1)
Immature Granulocytes: 0 %
Lymphocytes Absolute: 1.5 10*3/uL (ref 0.7–3.1)
Lymphs: 32 %
MCH: 30.7 pg (ref 26.6–33.0)
MCHC: 34 g/dL (ref 31.5–35.7)
MCV: 90 fL (ref 79–97)
Monocytes Absolute: 0.4 10*3/uL (ref 0.1–0.9)
Monocytes: 8 %
Neutrophils Absolute: 2.6 10*3/uL (ref 1.4–7.0)
Neutrophils: 58 %
Platelets: 196 10*3/uL (ref 150–450)
RBC: 5.37 x10E6/uL (ref 4.14–5.80)
RDW: 13.8 % (ref 11.6–15.4)
WBC: 4.5 10*3/uL (ref 3.4–10.8)

## 2023-01-15 ENCOUNTER — Ambulatory Visit (INDEPENDENT_AMBULATORY_CARE_PROVIDER_SITE_OTHER): Payer: BC Managed Care – PPO | Admitting: Physician Assistant

## 2023-01-15 ENCOUNTER — Encounter: Payer: Self-pay | Admitting: Physician Assistant

## 2023-01-15 DIAGNOSIS — M1712 Unilateral primary osteoarthritis, left knee: Secondary | ICD-10-CM

## 2023-01-15 MED ORDER — LIDOCAINE HCL 1 % IJ SOLN
3.0000 mL | INTRAMUSCULAR | Status: AC | PRN
Start: 2023-01-15 — End: 2023-01-15
  Administered 2023-01-15: 3 mL

## 2023-01-15 MED ORDER — HYALURONAN 30 MG/2ML IX SOSY
30.0000 mg | PREFILLED_SYRINGE | INTRA_ARTICULAR | Status: AC | PRN
Start: 2023-01-15 — End: 2023-01-15
  Administered 2023-01-15: 30 mg via INTRA_ARTICULAR

## 2023-01-15 NOTE — Progress Notes (Signed)
Procedure Note  Patient: Zachary [name]             Date of Birth: 08/06/1965           MRN: [id number]             Visit Date: 01/15/2023  HPI: Mr. Hood comes in today for scheduled Orthovisc injection left knee.  Has arthritic changes of the left knee.  He has been working hard on getting his glucose levels down.  He continues to have an effusion of the knee.  He has no scheduled surgery on the knee in the next 6 months.  Physical exam: Left knee slight effusion.  No abnormal warmth erythema.  Good range of motion.  Procedures: Visit Diagnoses:  1. Unilateral primary osteoarthritis, left knee     Large Joint Inj: L knee on 01/15/2023 11:54 AM Indications: pain Details: 22 G 1.5 in needle, superolateral approach  Arthrogram: No  Medications: 3 mL lidocaine 1 %; 30 mg Hyaluronan 30 MG/2ML Aspirate: 25 mL yellow Outcome: tolerated well, no immediate complications Procedure, treatment alternatives, risks and benefits explained, specific risks discussed. Consent was given by the patient. Immediately prior to procedure a time out was called to verify the correct patient, procedure, equipment, support staff and site/side marked as required. Patient was prepped and draped in the usual sterile fashion.    Plan: He will follow-up with [place] in 1 week for his second Orthovisc injection left knee.  Questions were encouraged and answered.  Ace bandage was applied after injection he will remove this before retiring to bed this evening.

## 2023-01-16 ENCOUNTER — Encounter: Payer: Self-pay | Admitting: Family Medicine

## 2023-01-17 ENCOUNTER — Encounter: Payer: Self-pay | Admitting: Family Medicine

## 2023-01-17 DIAGNOSIS — F4320 Adjustment disorder, unspecified: Secondary | ICD-10-CM | POA: Diagnosis not present

## 2023-01-18 DIAGNOSIS — F4320 Adjustment disorder, unspecified: Secondary | ICD-10-CM | POA: Diagnosis not present

## 2023-01-22 ENCOUNTER — Ambulatory Visit (INDEPENDENT_AMBULATORY_CARE_PROVIDER_SITE_OTHER): Payer: BC Managed Care – PPO | Admitting: Orthopaedic Surgery

## 2023-01-22 ENCOUNTER — Encounter: Payer: Self-pay | Admitting: Orthopaedic Surgery

## 2023-01-22 DIAGNOSIS — M25562 Pain in left knee: Secondary | ICD-10-CM

## 2023-01-22 DIAGNOSIS — M1712 Unilateral primary osteoarthritis, left knee: Secondary | ICD-10-CM | POA: Diagnosis not present

## 2023-01-22 DIAGNOSIS — G8929 Other chronic pain: Secondary | ICD-10-CM

## 2023-01-22 MED ORDER — HYALURONAN 30 MG/2ML IX SOSY
30.0000 mg | PREFILLED_SYRINGE | INTRA_ARTICULAR | Status: AC | PRN
Start: 2023-01-22 — End: 2023-01-22
  Administered 2023-01-22: 30 mg via INTRA_ARTICULAR

## 2023-01-22 NOTE — Progress Notes (Signed)
Procedure Note  Patient: Zachary [name]             Date of Birth: 03-22-1966           MRN: [id number]             Visit Date: 01/22/2023  Procedures: Visit Diagnoses:  1. Unilateral primary osteoarthritis, left knee   2. Chronic pain of left knee     Large Joint Inj: L knee on 01/22/2023 10:08 AM Indications: diagnostic evaluation and pain Details: 22 G 1.5 in needle, superolateral approach  Arthrogram: No  Medications: 30 mg Hyaluronan 30 MG/2ML Outcome: tolerated well, no immediate complications Procedure, treatment alternatives, risks and benefits explained, specific risks discussed. Consent was given by the patient. Immediately prior to procedure a time out was called to verify the correct patient, procedure, equipment, support staff and site/side marked as required. Patient was prepped and draped in the usual sterile fashion.    Zachary [name] is here for injection #2 of a series of 3 Orthovisc injections in his left knee to treat the pain from osteoarthritis.  He has had no adverse reaction to the first injection.  On exam his knee looks pretty good.  There is just a mild effusion.  I did place injection #2 of the 3 injections in his left knee without difficulty.  He will come back next week for injection #3.  All questions and concerns were answered and addressed.  Lot #[id number]

## 2023-01-23 DIAGNOSIS — F4320 Adjustment disorder, unspecified: Secondary | ICD-10-CM | POA: Diagnosis not present

## 2023-01-25 ENCOUNTER — Telehealth: Payer: Self-pay | Admitting: Family Medicine

## 2023-01-25 ENCOUNTER — Encounter: Payer: Self-pay | Admitting: Family Medicine

## 2023-01-25 DIAGNOSIS — F4320 Adjustment disorder, unspecified: Secondary | ICD-10-CM | POA: Diagnosis not present

## 2023-01-27 ENCOUNTER — Encounter: Payer: Self-pay | Admitting: Family Medicine

## 2023-01-29 ENCOUNTER — Encounter: Payer: Self-pay | Admitting: Orthopaedic Surgery

## 2023-01-29 ENCOUNTER — Encounter: Payer: Self-pay | Admitting: Family Medicine

## 2023-01-29 ENCOUNTER — Ambulatory Visit: Payer: BC Managed Care – PPO | Admitting: Orthopaedic Surgery

## 2023-01-29 DIAGNOSIS — M1712 Unilateral primary osteoarthritis, left knee: Secondary | ICD-10-CM

## 2023-01-29 DIAGNOSIS — M25562 Pain in left knee: Secondary | ICD-10-CM

## 2023-01-29 DIAGNOSIS — G8929 Other chronic pain: Secondary | ICD-10-CM

## 2023-01-29 MED ORDER — HYALURONAN 30 MG/2ML IX SOSY
30.0000 mg | PREFILLED_SYRINGE | INTRA_ARTICULAR | Status: AC | PRN
Start: 2023-01-29 — End: 2023-01-29
  Administered 2023-01-29: 30 mg via INTRA_ARTICULAR

## 2023-01-29 NOTE — Progress Notes (Signed)
   Procedure Note  Patient: Zachary [name]             Date of Birth: Jan 29, 1966           MRN: [id number]             Visit Date: 01/29/2023  Procedures: Visit Diagnoses:  1. Unilateral primary osteoarthritis, left knee   2. Chronic pain of left knee     Large Joint Inj: L knee on 01/29/2023 9:36 AM Indications: diagnostic evaluation and pain Details: 22 G 1.5 in needle, superolateral approach  Arthrogram: No  Medications: 30 mg Hyaluronan 30 MG/2ML Outcome: tolerated well, no immediate complications Procedure, treatment alternatives, risks and benefits explained, specific risks discussed. Consent was given by the patient. Immediately prior to procedure a time out was called to verify the correct patient, procedure, equipment, support staff and site/side marked as required. Patient was prepped and draped in the usual sterile fashion.     [name] is here for injection #3 of a series of 3 Orthovisc injections to treat pain from arthritis in his left knee.  He has a childhood friend of mine.  He is 57 years old.  He has had no adverse reactions to the first 2 injections.  His blood glucose runs normal now.  He has lost significant mount of weight and continues on that weight loss journey.  He does have a mild effusion today of his left knee and I was able to aspirate 25 cc of clear yellow fluid from the knee.  I then placed Orthovisc No. 3 which she tolerated well.  All question concerns were answered and addressed.  I would like to see him back in 3 months to see how he is doing overall.  If there are issues before then he knows to let us know.  Lot number: [id number]

## 2023-01-30 ENCOUNTER — Ambulatory Visit (INDEPENDENT_AMBULATORY_CARE_PROVIDER_SITE_OTHER): Payer: BC Managed Care – PPO

## 2023-01-30 ENCOUNTER — Other Ambulatory Visit: Payer: Self-pay | Admitting: Family Medicine

## 2023-01-30 DIAGNOSIS — E1165 Type 2 diabetes mellitus with hyperglycemia: Secondary | ICD-10-CM

## 2023-01-30 DIAGNOSIS — F339 Major depressive disorder, recurrent, unspecified: Secondary | ICD-10-CM

## 2023-01-30 DIAGNOSIS — F900 Attention-deficit hyperactivity disorder, predominantly inattentive type: Secondary | ICD-10-CM

## 2023-01-30 LAB — HM DIABETES EYE EXAM

## 2023-01-30 MED ORDER — BUPROPION HCL ER (XL) 300 MG PO TB24: 300.0000 mg | ORAL_TABLET | Freq: Every day | ORAL | 3 refills | Status: DC

## 2023-01-30 NOTE — Progress Notes (Signed)
Zachary [name] arrived 01/30/2023 and has given verbal consent to obtain images and complete their overdue diabetic retinal screening.  The images have been sent to an ophthalmologist or optometrist for review and interpretation.  Results will be sent back to Sonny Masters, FNP for review.  Patient has been informed they will be contacted when we receive the results via telephone or MyChart

## 2023-02-01 DIAGNOSIS — F4320 Adjustment disorder, unspecified: Secondary | ICD-10-CM | POA: Diagnosis not present

## 2023-02-02 DIAGNOSIS — F4320 Adjustment disorder, unspecified: Secondary | ICD-10-CM | POA: Diagnosis not present

## 2023-02-08 DIAGNOSIS — F4320 Adjustment disorder, unspecified: Secondary | ICD-10-CM | POA: Diagnosis not present

## 2023-02-09 DIAGNOSIS — F4320 Adjustment disorder, unspecified: Secondary | ICD-10-CM | POA: Diagnosis not present

## 2023-02-10 ENCOUNTER — Encounter: Payer: Self-pay | Admitting: Orthopaedic Surgery

## 2023-02-10 ENCOUNTER — Encounter: Payer: Self-pay | Admitting: Family Medicine

## 2023-02-15 DIAGNOSIS — F4320 Adjustment disorder, unspecified: Secondary | ICD-10-CM | POA: Diagnosis not present

## 2023-02-16 DIAGNOSIS — F4320 Adjustment disorder, unspecified: Secondary | ICD-10-CM | POA: Diagnosis not present

## 2023-02-19 DIAGNOSIS — F4320 Adjustment disorder, unspecified: Secondary | ICD-10-CM | POA: Diagnosis not present

## 2023-02-20 DIAGNOSIS — F4320 Adjustment disorder, unspecified: Secondary | ICD-10-CM | POA: Diagnosis not present

## 2023-02-22 ENCOUNTER — Encounter: Payer: Self-pay | Admitting: Family Medicine

## 2023-02-22 ENCOUNTER — Encounter: Payer: Self-pay | Admitting: Orthopaedic Surgery

## 2023-02-26 MED ORDER — ROSUVASTATIN CALCIUM 20 MG PO TABS: 20.0000 mg | ORAL_TABLET | Freq: Every day | ORAL | 0 refills | Status: DC

## 2023-02-26 MED ORDER — AMLODIPINE BESYLATE 5 MG PO TABS: 5.0000 mg | ORAL_TABLET | Freq: Every day | ORAL | 0 refills | Status: DC

## 2023-02-27 DIAGNOSIS — E119 Type 2 diabetes mellitus without complications: Secondary | ICD-10-CM | POA: Diagnosis not present

## 2023-03-01 DIAGNOSIS — F4320 Adjustment disorder, unspecified: Secondary | ICD-10-CM | POA: Diagnosis not present

## 2023-03-02 DIAGNOSIS — F4320 Adjustment disorder, unspecified: Secondary | ICD-10-CM | POA: Diagnosis not present

## 2023-03-06 DIAGNOSIS — F4320 Adjustment disorder, unspecified: Secondary | ICD-10-CM | POA: Diagnosis not present

## 2023-03-08 DIAGNOSIS — F4320 Adjustment disorder, unspecified: Secondary | ICD-10-CM | POA: Diagnosis not present

## 2023-03-09 DIAGNOSIS — F4320 Adjustment disorder, unspecified: Secondary | ICD-10-CM | POA: Diagnosis not present

## 2023-03-15 ENCOUNTER — Encounter: Payer: Self-pay | Admitting: *Deleted

## 2023-03-15 DIAGNOSIS — F4320 Adjustment disorder, unspecified: Secondary | ICD-10-CM | POA: Diagnosis not present

## 2023-03-16 DIAGNOSIS — F4320 Adjustment disorder, unspecified: Secondary | ICD-10-CM | POA: Diagnosis not present

## 2023-03-19 DIAGNOSIS — F4320 Adjustment disorder, unspecified: Secondary | ICD-10-CM | POA: Diagnosis not present

## 2023-03-22 ENCOUNTER — Encounter: Payer: Self-pay | Admitting: Family Medicine

## 2023-03-25 ENCOUNTER — Encounter: Payer: Self-pay | Admitting: Family Medicine

## 2023-03-26 DIAGNOSIS — F4320 Adjustment disorder, unspecified: Secondary | ICD-10-CM | POA: Diagnosis not present

## 2023-04-02 DIAGNOSIS — F4320 Adjustment disorder, unspecified: Secondary | ICD-10-CM | POA: Diagnosis not present

## 2023-04-03 DIAGNOSIS — F4320 Adjustment disorder, unspecified: Secondary | ICD-10-CM | POA: Diagnosis not present

## 2023-04-07 ENCOUNTER — Encounter: Payer: Self-pay | Admitting: Family Medicine

## 2023-04-07 DIAGNOSIS — N183 Chronic kidney disease, stage 3 unspecified: Secondary | ICD-10-CM

## 2023-04-07 DIAGNOSIS — E1169 Type 2 diabetes mellitus with other specified complication: Secondary | ICD-10-CM

## 2023-04-07 MED ORDER — DAPAGLIFLOZIN PROPANEDIOL 10 MG PO TABS
10.0000 mg | ORAL_TABLET | Freq: Every day | ORAL | 2 refills | Status: DC
Start: 2023-04-07 — End: 2023-07-19
  Filled 2023-07-04: qty 90, 90d supply, fill #0

## 2023-04-10 DIAGNOSIS — F4320 Adjustment disorder, unspecified: Secondary | ICD-10-CM | POA: Diagnosis not present

## 2023-04-11 ENCOUNTER — Encounter: Payer: Self-pay | Admitting: Family Medicine

## 2023-04-12 DIAGNOSIS — F4320 Adjustment disorder, unspecified: Secondary | ICD-10-CM | POA: Diagnosis not present

## 2023-04-13 ENCOUNTER — Ambulatory Visit: Payer: BC Managed Care – PPO | Admitting: Family Medicine

## 2023-04-18 ENCOUNTER — Encounter: Payer: BC Managed Care – PPO | Admitting: Family Medicine

## 2023-04-18 DIAGNOSIS — F4320 Adjustment disorder, unspecified: Secondary | ICD-10-CM | POA: Diagnosis not present

## 2023-04-19 ENCOUNTER — Encounter: Payer: Self-pay | Admitting: Family Medicine

## 2023-04-19 ENCOUNTER — Ambulatory Visit: Payer: BC Managed Care – PPO | Admitting: Family Medicine

## 2023-04-19 ENCOUNTER — Other Ambulatory Visit (HOSPITAL_COMMUNITY): Payer: Self-pay

## 2023-04-19 ENCOUNTER — Encounter: Payer: Self-pay | Admitting: Orthopaedic Surgery

## 2023-04-19 VITALS — BP 125/84 | HR 105 | Temp 97.7°F | Ht 74.0 in | Wt 233.0 lb

## 2023-04-19 DIAGNOSIS — E1122 Type 2 diabetes mellitus with diabetic chronic kidney disease: Secondary | ICD-10-CM

## 2023-04-19 DIAGNOSIS — Z0001 Encounter for general adult medical examination with abnormal findings: Secondary | ICD-10-CM

## 2023-04-19 DIAGNOSIS — E785 Hyperlipidemia, unspecified: Secondary | ICD-10-CM

## 2023-04-19 DIAGNOSIS — I152 Hypertension secondary to endocrine disorders: Secondary | ICD-10-CM | POA: Diagnosis not present

## 2023-04-19 DIAGNOSIS — R55 Syncope and collapse: Secondary | ICD-10-CM

## 2023-04-19 DIAGNOSIS — Z0000 Encounter for general adult medical examination without abnormal findings: Secondary | ICD-10-CM

## 2023-04-19 DIAGNOSIS — E1159 Type 2 diabetes mellitus with other circulatory complications: Secondary | ICD-10-CM

## 2023-04-19 DIAGNOSIS — K219 Gastro-esophageal reflux disease without esophagitis: Secondary | ICD-10-CM | POA: Diagnosis not present

## 2023-04-19 DIAGNOSIS — N183 Chronic kidney disease, stage 3 unspecified: Secondary | ICD-10-CM

## 2023-04-19 DIAGNOSIS — Z7984 Long term (current) use of oral hypoglycemic drugs: Secondary | ICD-10-CM

## 2023-04-19 DIAGNOSIS — E1165 Type 2 diabetes mellitus with hyperglycemia: Secondary | ICD-10-CM

## 2023-04-19 DIAGNOSIS — E66813 Obesity, class 3: Secondary | ICD-10-CM

## 2023-04-19 DIAGNOSIS — E1169 Type 2 diabetes mellitus with other specified complication: Secondary | ICD-10-CM

## 2023-04-19 DIAGNOSIS — Z125 Encounter for screening for malignant neoplasm of prostate: Secondary | ICD-10-CM

## 2023-04-19 DIAGNOSIS — F4320 Adjustment disorder, unspecified: Secondary | ICD-10-CM | POA: Diagnosis not present

## 2023-04-19 DIAGNOSIS — R6889 Other general symptoms and signs: Secondary | ICD-10-CM | POA: Diagnosis not present

## 2023-04-19 LAB — BAYER DCA HB A1C WAIVED: HB A1C (BAYER DCA - WAIVED): 4.8 % (ref 4.8–5.6)

## 2023-04-19 LAB — CBC WITH DIFFERENTIAL/PLATELET
Basophils Absolute: 0 10*3/uL (ref 0.0–0.2)
Basos: 1 %
EOS (ABSOLUTE): 0.1 10*3/uL (ref 0.0–0.4)
Eos: 2 %
Hematocrit: 49.8 % (ref 37.5–51.0)
Hemoglobin: 17 g/dL (ref 13.0–17.7)
Immature Grans (Abs): 0 10*3/uL (ref 0.0–0.1)
Immature Granulocytes: 0 %
Lymphocytes Absolute: 1.8 10*3/uL (ref 0.7–3.1)
Lymphs: 27 %
MCH: 32 pg (ref 26.6–33.0)
MCHC: 34.1 g/dL (ref 31.5–35.7)
MCV: 94 fL (ref 79–97)
Monocytes Absolute: 0.4 10*3/uL (ref 0.1–0.9)
Monocytes: 5 %
Neutrophils Absolute: 4.3 10*3/uL (ref 1.4–7.0)
Neutrophils: 65 %
Platelets: 210 10*3/uL (ref 150–450)
RBC: 5.32 x10E6/uL (ref 4.14–5.80)
RDW: 12.9 % (ref 11.6–15.4)
WBC: 6.6 10*3/uL (ref 3.4–10.8)

## 2023-04-19 MED ORDER — AMLODIPINE BESYLATE 5 MG PO TABS: 5.0000 mg | ORAL_TABLET | Freq: Every day | ORAL | 0 refills | Status: DC

## 2023-04-19 MED ORDER — MOUNJARO 10 MG/0.5ML ~~LOC~~ SOAJ
10.0000 mg | SUBCUTANEOUS | 3 refills | Status: DC
  Filled 2023-04-19: qty 2, 28d supply, fill #0
  Filled 2023-05-13 – 2023-05-14 (×2): qty 2, 28d supply, fill #1

## 2023-04-19 MED ORDER — FAMOTIDINE 20 MG PO TABS
20.0000 mg | ORAL_TABLET | Freq: Two times a day (BID) | ORAL | 1 refills | Status: DC
Start: 2023-04-19 — End: 2023-10-12

## 2023-04-19 MED ORDER — ROSUVASTATIN CALCIUM 20 MG PO TABS: 20.0000 mg | ORAL_TABLET | Freq: Every day | ORAL | 1 refills | Status: DC

## 2023-04-19 NOTE — Patient Instructions (Signed)

## 2023-04-19 NOTE — Progress Notes (Signed)
Complete physical exam  Patient: Zachary [name]   DOB: [birth year]   58 y.o. Male  MRN: [id number]  Subjective:    Chief Complaint  Patient presents with   Annual Exam    Zachary [name] is a 58 y.o. male who presents today for a complete physical exam. He reports consuming a  no carbs  diet. The patient does not participate in regular exercise at present. He generally feels well. He reports sleeping well. He does not have additional problems to discuss today.    Most recent fall risk assessment:    04/19/2023    3:07 PM  Fall Risk   Falls in the past year? 0  Risk for fall due to : No Fall Risks  Follow up Falls prevention discussed     Most recent depression screenings:    04/19/2023    3:07 PM 01/11/2023    3:08 PM  PHQ 2/9 Scores  PHQ - 2 Score 2 1  PHQ- 9 Score 10 5    Vision:Within last year and Dental: No current dental problems and Receives regular dental care  Patient Active Problem List   Diagnosis Date Noted   Erectile dysfunction due to diabetes mellitus (HCC) 09/05/2022   CKD stage 3 due to type 2 diabetes mellitus (HCC) 09/05/2022   Rosacea 09/05/2022   Depression, recurrent (HCC) 09/05/2022   NAFLD (nonalcoholic fatty liver disease) 40/98/1191   Gastroesophageal reflux disease without esophagitis 07/10/2019   Obesity, Class III, BMI 40-49.9 (morbid obesity) (HCC) 03/30/2017   Type 2 diabetes mellitus with hyperglycemia (HCC) 10/29/2014   Mild intermittent asthma without complication 08/06/2009   Hyperlipidemia associated with type 2 diabetes mellitus (HCC) 08/06/2009   Hypertension associated with diabetes (HCC) 05/01/2007   ADHD (attention deficit hyperactivity disorder), inattentive type 08/29/2006   Binge eating disorder 03/27/1988   Past Medical History:  Diagnosis Date   ADHD    Asthma    Binge eating disorder    Diabetes mellitus    GERD (gastroesophageal reflux disease)    Gout    Hyperlipidemia    Hypertension    Insomnia    Kidney  stones    Low magnesium level    Vitamin D deficiency    Past Surgical History:  Procedure Laterality Date   WISDOM TOOTH EXTRACTION     WRIST SURGERY Right    Social History   Tobacco Use   Smoking status: Never   Smokeless tobacco: Never  Vaping Use   Vaping status: Never Used  Substance Use Topics   Alcohol use: Yes    Comment: occ   Drug use: No   Social History   Socioeconomic History   Marital status: Married    Spouse name: Not on file   Number of children: Not on file   Years of education: Not on file   Highest education level: Bachelor's degree (e.g., BA, AB, BS)  Occupational History   Not on file  Tobacco Use   Smoking status: Never   Smokeless tobacco: Never  Vaping Use   Vaping status: Never Used  Substance and Sexual Activity   Alcohol use: Yes    Comment: occ   Drug use: No   Sexual activity: Not Currently  Other Topics Concern   Not on file  Social History Narrative   Lives with spouse and kids   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         Social Drivers of Health  Financial Resource Strain: Low Risk  (01/07/2023)   Overall Financial Resource Strain (CARDIA)    Difficulty of Paying Living Expenses: Not hard at all  Food Insecurity: No Food Insecurity (01/07/2023)   Hunger Vital Sign    Worried About Running Out of Food in the Last Year: Never true    Ran Out of Food in the Last Year: Never true  Transportation Needs: No Transportation Needs (01/07/2023)   PRAPARE - Administrator, Civil Service (Medical): No    Lack of Transportation (Non-Medical): No  Physical Activity: Unknown (01/07/2023)   Exercise Vital Sign    Days of Exercise per Week: 0 days    Minutes of Exercise per Session: Not on file  Stress: Stress Concern Present (01/07/2023)   [employer] of Occupational Health - Occupational Stress Questionnaire    Feeling of Stress : To some extent  Social Connections: Moderately Integrated  (01/07/2023)   Social Connection and Isolation Panel [NHANES]    Frequency of Communication with Friends and Family: Once a week    Frequency of Social Gatherings with Friends and Family: Never    Attends Religious Services: More than 4 times per year    Active Member of [employer] or Organizations: Yes    Attends Engineer, structural: More than 4 times per year    Marital Status: Married  Catering manager Violence: Not At Risk (01/31/2022)   Received from [employer], [employer] [facility]   HITS    Over the last 12 months how often did your partner physically hurt you?: Never    Over the last 12 months how often did your partner insult you or talk down to you?: Never    Over the last 12 months how often did your partner threaten you with physical harm?: Never    Over the last 12 months how often did your partner scream or curse at you?: Never   Family Status  Relation Name Status   Mother  Alive   Father  Alive   Brother  Alive   Son 2 Alive   MGM  Deceased   MGF  Deceased   PGM  Deceased   PGF  Deceased  No partnership data on file   Family History  Problem Relation Age of Onset   Breast cancer Mother    Hypertension Mother    Pneumonia Mother    Crohn's disease Mother    Leukemia Father    GER disease Father    Macular degeneration Father    Alcohol abuse Brother    ADD / ADHD Son    Stroke Maternal Grandmother    Stroke Paternal Grandmother    Allergies  Allergen Reactions   Ace Inhibitors     REACTION: cough   Sulfa Antibiotics Other (See Comments)    unk   Sulfamethoxazole-Trimethoprim     REACTION: unspecified      Patient Care Team: Sonny Masters, FNP as PCP - General (Family Medicine)   Outpatient Medications Prior to Visit  Medication Sig   aspirin 81 MG tablet Take 81 mg by mouth daily.    buPROPion (WELLBUTRIN XL) 300 MG 24 hr tablet Take 1 tablet (300 mg total) by mouth daily.   Continuous Glucose Receiver (FREESTYLE LIBRE 3 READER) DEVI 1 each  by Does not apply route in the morning, at noon, in the evening, and at bedtime.   Continuous Glucose Sensor (FREESTYLE LIBRE 3 SENSOR) MISC 1 each by Does not apply route 4 (  four) times daily. Place 1 sensor on the skin every 14 days. Use to check glucose continuously   dapagliflozin propanediol (FARXIGA) 10 MG TABS tablet Take 1 tablet (10 mg total) by mouth daily.   tadalafil (CIALIS) 20 MG tablet Take 1 tablet (20 mg total) by mouth every other day as needed for erectile dysfunction.   valsartan (DIOVAN) 320 MG tablet Take 1 tablet (320 mg total) by mouth daily.   [DISCONTINUED] amLODipine (NORVASC) 5 MG tablet Take 1 tablet (5 mg total) by mouth daily.   [DISCONTINUED] famotidine (PEPCID) 20 MG tablet Take 1 tablet (20 mg total) by mouth 2 (two) times daily.   [DISCONTINUED] rosuvastatin (CRESTOR) 20 MG tablet Take 1 tablet (20 mg total) by mouth daily.   [DISCONTINUED] tirzepatide (MOUNJARO) 10 MG/0.5ML Pen Inject 10 mg into the skin once a week.   [DISCONTINUED] albuterol (PROAIR HFA) 108 (90 BASE) MCG/ACT inhaler Inhale 2 puffs into the lungs every 4 (four) hours as needed for wheezing.   [DISCONTINUED] metroNIDAZOLE (METROGEL) 1 % gel Apply topically daily.   No facility-administered medications prior to visit.    Review of Systems  Cardiovascular:  Negative for chest pain, palpitations, orthopnea, claudication, leg swelling and PND.  Genitourinary:  Negative for dysuria, flank pain, frequency, hematuria and urgency.  Endo/Heme/Allergies:  Negative for environmental allergies and polydipsia. Does not bruise/bleed easily.  All other systems reviewed and are negative.      Objective:     BP 125/84   Pulse (!) 105   Temp 97.7 F (36.5 C)   Ht 6\' 2"  (1.88 m)   Wt 233 lb (105.7 kg)   SpO2 96%   BMI 29.92 kg/m  BP Readings from Last 3 Encounters:  04/19/23 125/84  01/11/23 123/82  10/03/22 (!) 152/99   Wt Readings from Last 3 Encounters:  04/19/23 233 lb (105.7 kg)   01/11/23 245 lb 12.8 oz (111.5 kg)  10/03/22 297 lb 3.2 oz (134.8 kg)   SpO2 Readings from Last 3 Encounters:  04/19/23 96%  01/11/23 95%  10/03/22 96%      Physical Exam Vitals and nursing note reviewed.  Constitutional:      General: He is not in acute distress.    Appearance: Normal appearance. He is well-developed, well-groomed and overweight. He is not ill-appearing, toxic-appearing or diaphoretic.  HENT:     Head: Normocephalic and atraumatic.     Jaw: There is normal jaw occlusion.     Right Ear: Hearing, tympanic membrane, ear canal and external ear normal.     Left Ear: Hearing, tympanic membrane, ear canal and external ear normal.     Nose: Nose normal.     Mouth/Throat:     Lips: Pink.     Mouth: Mucous membranes are moist.     Pharynx: Oropharynx is clear. Uvula midline.  Eyes:     General: Lids are normal.     Extraocular Movements: Extraocular movements intact.     Conjunctiva/sclera: Conjunctivae normal.     Pupils: Pupils are equal, round, and reactive to light.  Neck:     Thyroid: No thyroid mass, thyromegaly or thyroid tenderness.     Vascular: No carotid bruit or JVD.     Trachea: Trachea and phonation normal.  Cardiovascular:     Rate and Rhythm: Normal rate and regular rhythm.     Chest Wall: PMI is not displaced.     Pulses: Normal pulses.     Heart sounds: Normal heart sounds. No murmur  heard.    No friction rub. No gallop.  Pulmonary:     Effort: Pulmonary effort is normal. No respiratory distress.     Breath sounds: Normal breath sounds. No wheezing.  Abdominal:     General: Bowel sounds are normal. There is no distension or abdominal bruit.     Palpations: Abdomen is soft. There is no hepatomegaly or splenomegaly.     Tenderness: There is no abdominal tenderness. There is no right CVA tenderness or left CVA tenderness.     Hernia: No hernia is present.  Musculoskeletal:        General: Normal range of motion.     Cervical back: Normal  range of motion and neck supple.     Right lower leg: No edema.     Left lower leg: No edema.  Lymphadenopathy:     Cervical: No cervical adenopathy.  Skin:    General: Skin is warm and dry.     Capillary Refill: Capillary refill takes less than 2 seconds.     Coloration: Skin is not cyanotic, jaundiced or pale.     Findings: No rash.  Neurological:     General: No focal deficit present.     Mental Status: He is alert and oriented to person, place, and time.     Sensory: Sensation is intact.     Motor: Motor function is intact.     Coordination: Coordination is intact.     Gait: Gait is intact.     Deep Tendon Reflexes: Reflexes are normal and symmetric.  Psychiatric:        Attention and Perception: Attention and perception normal.        Mood and Affect: Mood and affect normal.        Speech: Speech normal.        Behavior: Behavior normal. Behavior is cooperative.        Thought Content: Thought content normal.        Cognition and Memory: Cognition and memory normal.        Judgment: Judgment normal.       Last CBC Lab Results  Component Value Date   WBC 4.5 01/11/2023   HGB 16.5 01/11/2023   HCT 48.5 01/11/2023   MCV 90 01/11/2023   MCH 30.7 01/11/2023   RDW 13.8 01/11/2023   PLT 196 01/11/2023   Last metabolic panel Lab Results  Component Value Date   GLUCOSE 83 01/11/2023   NA 139 01/11/2023   K 4.4 01/11/2023   CL 99 01/11/2023   CO2 21 01/11/2023   BUN 23 01/11/2023   CREATININE 1.76 (H) 01/11/2023   EGFR 45 (L) 01/11/2023   CALCIUM 9.6 01/11/2023   PROT 7.4 01/11/2023   ALBUMIN 4.5 01/11/2023   LABGLOB 2.9 01/11/2023   AGRATIO 1.5 09/05/2022   BILITOT 0.8 01/11/2023   ALKPHOS 70 01/11/2023   AST 23 01/11/2023   ALT 17 01/11/2023   Last lipids Lab Results  Component Value Date   CHOL 167 09/04/2016   HDL 39.50 09/04/2016   LDLCALC 107 (H) 09/04/2016   LDLDIRECT 177.7 11/17/2011   TRIG 100.0 09/04/2016   CHOLHDL 4 09/04/2016   Last  hemoglobin A1c Lab Results  Component Value Date   HGBA1C 5.4 01/11/2023   Last thyroid functions Lab Results  Component Value Date   TSH 1.570 09/05/2022   T4TOTAL 8.2 09/05/2022       Assessment & Plan:    Routine Health Maintenance and Physical Exam  Immunization History  Administered Date(s) Administered   Hepatitis B 03/27/1989   Hepatitis B, ADULT 03/27/1989   Influenza Inj Mdck Quad Pf 12/13/2020, 01/20/2022   Influenza Split 01/26/2012   Influenza Whole 12/25/2005   Influenza, Seasonal, Injecte, Preservative Fre 01/07/2016, 12/28/2016, 12/27/2017, 01/11/2023   Influenza,inj,Quad PF,6+ Mos 01/07/2016, 12/28/2016, 12/27/2017, 01/03/2019, 01/02/2020   Influenza,trivalent, recombinat, inj, PF 01/26/2012   Influenza-Unspecified 12/25/2013, 01/07/2015, 12/26/2015   PFIZER(Purple Top)SARS-COV-2 Vaccination 03/15/2019, 04/07/2019, 12/30/2019   PNEUMOCOCCAL CONJUGATE-20 10/21/2021   Pfizer Covid-19 Vaccine Bivalent Booster 58yrs & up 04/14/2021   Td 03/27/1997   Tdap 11/15/2012, 01/06/2021   Zoster Recombinant(Shingrix) 11/21/2021, 01/31/2022    Health Maintenance  Topic Date Due   Diabetic kidney evaluation - Urine ACR  09/01/2016   COVID-19 Vaccine (5 - 2024-25 season) 05/05/2023 (Originally 11/26/2022)   Hepatitis C Screening  09/05/2023 (Originally 08/14/1985)   HIV Screening  09/05/2023 (Originally 08/15/1982)   HEMOGLOBIN A1C  07/12/2023   Diabetic kidney evaluation - eGFR measurement  01/11/2024   FOOT EXAM  01/11/2024   OPHTHALMOLOGY EXAM  04/04/2024   DTaP/Tdap/Td (4 - Td or Tdap) 01/07/2031   Colonoscopy  05/17/2031   Pneumococcal Vaccine 65-32 Years old  Completed   INFLUENZA VACCINE  Completed   Zoster Vaccines- Shingrix  Completed   HPV VACCINES  Aged Out    Discussed health benefits of physical activity, and encouraged him to engage in regular exercise appropriate for his age and condition.  Problem List Items Addressed This Visit        Cardiovascular and Mediastinum   Hypertension associated with diabetes (HCC)   Relevant Medications   rosuvastatin (CRESTOR) 20 MG tablet   amLODipine (NORVASC) 5 MG tablet   tirzepatide (MOUNJARO) 10 MG/0.5ML Pen   Other Relevant Orders   Lipid panel   CMP14+EGFR     Digestive   Gastroesophageal reflux disease without esophagitis   Relevant Medications   famotidine (PEPCID) 20 MG tablet   Other Relevant Orders   CBC with Differential/Platelet     Endocrine   Hyperlipidemia associated with type 2 diabetes mellitus (HCC) (Chronic)   Relevant Medications   rosuvastatin (CRESTOR) 20 MG tablet   amLODipine (NORVASC) 5 MG tablet   tirzepatide (MOUNJARO) 10 MG/0.5ML Pen   Other Relevant Orders   Lipid panel   CMP14+EGFR   CKD stage 3 due to type 2 diabetes mellitus (HCC)   Relevant Medications   rosuvastatin (CRESTOR) 20 MG tablet   tirzepatide (MOUNJARO) 10 MG/0.5ML Pen   Other Relevant Orders   CMP14+EGFR   Microalbumin / creatinine urine ratio     Other   Obesity, Class III, BMI 40-49.9 (morbid obesity) (HCC) (Chronic)   Relevant Medications   tirzepatide (MOUNJARO) 10 MG/0.5ML Pen   Other Relevant Orders   Lipid panel   CBC with Differential/Platelet   CMP14+EGFR   Bayer DCA Hb A1c Waived   Thyroid Panel With TSH   Other Visit Diagnoses       Annual physical exam    -  Primary   Relevant Orders   Lipid panel   CBC with Differential/Platelet   CMP14+EGFR   Bayer DCA Hb A1c Waived   Thyroid Panel With TSH   PSA, total and free     Screening for prostate cancer       Relevant Orders   PSA, total and free     Type 2 diabetes mellitus with other specified complication, without long-term current use of insulin (HCC)  Relevant Medications   rosuvastatin (CRESTOR) 20 MG tablet   tirzepatide (MOUNJARO) 10 MG/0.5ML Pen   Other Relevant Orders   CBC with Differential/Platelet   Bayer DCA Hb A1c Waived   Microalbumin / creatinine urine ratio      Return in  about 3 months (around 07/18/2023) for DM.     Kari Baars, FNP

## 2023-04-20 ENCOUNTER — Encounter: Payer: Self-pay | Admitting: Family Medicine

## 2023-04-20 DIAGNOSIS — F4320 Adjustment disorder, unspecified: Secondary | ICD-10-CM | POA: Diagnosis not present

## 2023-04-20 LAB — CMP14+EGFR
ALT: 29 [IU]/L (ref 0–44)
AST: 29 [IU]/L (ref 0–40)
Albumin: 4.8 g/dL (ref 3.8–4.9)
Alkaline Phosphatase: 66 [IU]/L (ref 44–121)
BUN/Creatinine Ratio: 19 (ref 9–20)
BUN: 30 mg/dL — ABNORMAL HIGH (ref 6–24)
Bilirubin Total: 0.5 mg/dL (ref 0.0–1.2)
CO2: 21 mmol/L (ref 20–29)
Calcium: 9.7 mg/dL (ref 8.7–10.2)
Chloride: 101 mmol/L (ref 96–106)
Creatinine, Ser: 1.54 mg/dL — ABNORMAL HIGH (ref 0.76–1.27)
Globulin, Total: 2.8 g/dL (ref 1.5–4.5)
Glucose: 97 mg/dL (ref 70–99)
Potassium: 4.5 mmol/L (ref 3.5–5.2)
Sodium: 141 mmol/L (ref 134–144)
Total Protein: 7.6 g/dL (ref 6.0–8.5)
eGFR: 53 mL/min/{1.73_m2} — ABNORMAL LOW (ref >59)

## 2023-04-20 LAB — THYROID PANEL WITH TSH
Free Thyroxine Index: 2.4 (ref 1.2–4.9)
T3 Uptake Ratio: 26 % (ref 24–39)
T4, Total: 9.1 ug/dL (ref 4.5–12.0)
TSH: 2.25 u[IU]/mL (ref 0.450–4.500)

## 2023-04-20 LAB — PSA, TOTAL AND FREE
PSA, Free Pct: 54 %
PSA, Free: 0.27 ng/mL
Prostate Specific Ag, Serum: 0.5 ng/mL (ref 0.0–4.0)

## 2023-04-20 LAB — MICROALBUMIN / CREATININE URINE RATIO
Creatinine, Urine: 164.1 mg/dL
Microalb/Creat Ratio: 94 mg/g{creat} — ABNORMAL HIGH (ref 0–29)
Microalbumin, Urine: 154.3 ug/mL

## 2023-04-20 LAB — LIPID PANEL
Chol/HDL Ratio: 2.9 {ratio} (ref 0.0–5.0)
Cholesterol, Total: 165 mg/dL (ref 100–199)
HDL: 56 mg/dL (ref >39)
LDL Chol Calc (NIH): 75 mg/dL (ref 0–99)
Triglycerides: 209 mg/dL — ABNORMAL HIGH (ref 0–149)
VLDL Cholesterol Cal: 34 mg/dL (ref 5–40)

## 2023-04-20 NOTE — Addendum Note (Signed)
Addended by: Sonny Masters on: 04/20/2023 03:02 PM   Modules accepted: Orders

## 2023-04-23 ENCOUNTER — Encounter: Payer: Self-pay | Admitting: Family Medicine

## 2023-04-23 ENCOUNTER — Other Ambulatory Visit: Payer: Self-pay

## 2023-04-23 ENCOUNTER — Other Ambulatory Visit (HOSPITAL_COMMUNITY): Payer: Self-pay

## 2023-04-23 DIAGNOSIS — F4320 Adjustment disorder, unspecified: Secondary | ICD-10-CM | POA: Diagnosis not present

## 2023-04-23 MED ORDER — FREESTYLE LIBRE 3 SENSOR MISC
1.0000 | 11 refills | Status: DC
  Filled 2023-04-23 – 2023-04-28 (×3): qty 2, 28d supply, fill #0
  Filled 2023-05-28: qty 2, 28d supply, fill #1
  Filled 2023-07-02: qty 2, 28d supply, fill #2
  Filled 2023-07-31: qty 2, 28d supply, fill #3
  Filled 2023-08-22: qty 2, 28d supply, fill #4
  Filled 2023-09-21 (×2): qty 2, 28d supply, fill #0

## 2023-04-23 NOTE — Addendum Note (Signed)
Addended by: Sonny Masters on: 04/23/2023 06:18 PM   Modules accepted: Orders

## 2023-04-24 ENCOUNTER — Other Ambulatory Visit: Payer: Self-pay

## 2023-04-24 ENCOUNTER — Other Ambulatory Visit (HOSPITAL_COMMUNITY): Payer: Self-pay

## 2023-04-25 ENCOUNTER — Other Ambulatory Visit (HOSPITAL_COMMUNITY): Payer: Self-pay

## 2023-04-25 ENCOUNTER — Other Ambulatory Visit: Payer: Self-pay

## 2023-04-26 DIAGNOSIS — F4320 Adjustment disorder, unspecified: Secondary | ICD-10-CM | POA: Diagnosis not present

## 2023-04-27 ENCOUNTER — Ambulatory Visit: Payer: BC Managed Care – PPO | Admitting: Nurse Practitioner

## 2023-04-27 ENCOUNTER — Encounter: Payer: Self-pay | Admitting: Pharmacist

## 2023-04-27 ENCOUNTER — Encounter: Payer: Self-pay | Admitting: Nurse Practitioner

## 2023-04-27 ENCOUNTER — Encounter: Payer: Self-pay | Admitting: Family Medicine

## 2023-04-27 ENCOUNTER — Other Ambulatory Visit (HOSPITAL_COMMUNITY): Payer: Self-pay

## 2023-04-27 ENCOUNTER — Other Ambulatory Visit (HOSPITAL_BASED_OUTPATIENT_CLINIC_OR_DEPARTMENT_OTHER): Payer: Self-pay

## 2023-04-27 ENCOUNTER — Other Ambulatory Visit: Payer: Self-pay

## 2023-04-27 VITALS — BP 128/92 | HR 92 | Temp 98.2°F | Ht 74.0 in | Wt 233.0 lb

## 2023-04-27 DIAGNOSIS — H66001 Acute suppurative otitis media without spontaneous rupture of ear drum, right ear: Secondary | ICD-10-CM

## 2023-04-27 DIAGNOSIS — H6122 Impacted cerumen, left ear: Secondary | ICD-10-CM | POA: Diagnosis not present

## 2023-04-27 DIAGNOSIS — F4320 Adjustment disorder, unspecified: Secondary | ICD-10-CM | POA: Diagnosis not present

## 2023-04-27 MED ORDER — FLUTICASONE PROPIONATE 50 MCG/ACT NA SUSP
2.0000 | Freq: Every day | NASAL | 6 refills | Status: AC
  Filled 2023-04-27 (×2): qty 16, 30d supply, fill #0

## 2023-04-27 MED ORDER — CEFDINIR 300 MG PO CAPS
300.0000 mg | ORAL_CAPSULE | Freq: Two times a day (BID) | ORAL | 0 refills | Status: DC
  Filled 2023-04-27 (×2): qty 20, 10d supply, fill #0

## 2023-04-27 MED ORDER — METHYLPREDNISOLONE ACETATE 80 MG/ML IJ SUSP
80.0000 mg | Freq: Once | INTRAMUSCULAR | Status: AC
  Administered 2023-04-27: 80 mg via INTRAMUSCULAR

## 2023-04-27 NOTE — Progress Notes (Signed)
Subjective:    Patient ID: Zachary [name], male    DOB: [birth year], 58 y.o.   MRN: [id number]   Chief Complaint: Ear Pain (Right ear/)   Otalgia  There is pain in the right ear. This is a new problem. The current episode started in the past 7 days. The problem occurs constantly. The problem has been waxing and waning. There has been no fever. The pain is at a severity of 2/10. The pain is mild. Pertinent negatives include no coughing, ear discharge or rhinorrhea. He has tried acetaminophen for the symptoms.    Patient Active Problem List   Diagnosis Date Noted   Erectile dysfunction due to diabetes mellitus (HCC) 09/05/2022   CKD stage 3 due to type 2 diabetes mellitus (HCC) 09/05/2022   Rosacea 09/05/2022   Depression, recurrent (HCC) 09/05/2022   NAFLD (nonalcoholic fatty liver disease) 40/98/1191   Gastroesophageal reflux disease without esophagitis 07/10/2019   Obesity, Class III, BMI 40-49.9 (morbid obesity) (HCC) 03/30/2017   Type 2 diabetes mellitus with hyperglycemia (HCC) 10/29/2014   Mild intermittent asthma without complication 08/06/2009   Hyperlipidemia associated with type 2 diabetes mellitus (HCC) 08/06/2009   Hypertension associated with diabetes (HCC) 05/01/2007   ADHD (attention deficit hyperactivity disorder), inattentive type 08/29/2006   Binge eating disorder 03/27/1988       Review of Systems  Constitutional:  Negative for chills and fever.  HENT:  Positive for ear pain. Negative for congestion, ear discharge and rhinorrhea.   Respiratory:  Negative for cough.        Objective:   Physical Exam Vitals reviewed.  Constitutional:      Appearance: Normal appearance.  HENT:     Right Ear: A middle ear effusion is present. Tympanic membrane is erythematous.     Left Ear: There is impacted cerumen.  Cardiovascular:     Rate and Rhythm: Normal rate and regular rhythm.     Heart sounds: Normal heart sounds.  Pulmonary:     Effort: Pulmonary effort is  normal.     Breath sounds: Normal breath sounds.  Skin:    General: Skin is warm.  Neurological:     General: No focal deficit present.     Mental Status: He is alert and oriented to person, place, and time.  Psychiatric:        Mood and Affect: Mood normal.        Behavior: Behavior normal.    BP (!) 128/92   Pulse 92   Temp 98.2 F (36.8 C) (Temporal)   Ht 6\' 2"  (1.88 m)   Wt 233 lb (105.7 kg)   SpO2 96%   BMI 29.92 kg/m   S?P ear irrigation- upper edge oftm clear      Assessment & Plan:   Rollene Rotunda in today with chief complaint of Ear Pain (Right ear/)   1. Non-recurrent acute suppurative otitis media of right ear without spontaneous rupture of tympanic membrane (Primary) Force fluids Tylenol as needed for pain Meds ordered this encounter  Medications   fluticasone (FLONASE) 50 MCG/ACT nasal spray    Sig: Place 2 sprays into both nostrils daily.    Dispense:  16 g    Refill:  6    Supervising Provider:   Arville Care A [[id number]]   cefdinir (OMNICEF) 300 MG capsule    Sig: Take 1 capsule (300 mg total) by mouth 2 (two) times daily. 1 po BID    Dispense:  20 capsule  Refill:  0    Supervising Provider:   Arville Care A [[id number]]   methylPREDNISolone acetate (DEPO-MEDROL) injection 80 mg       2. Impacted cerumen of left ear Debrox several times a week    The above assessment and management plan was discussed with the patient. The patient verbalized understanding of and has agreed to the management plan. Patient is aware to call the clinic if symptoms persist or worsen. Patient is aware when to return to the clinic for a follow-up visit. Patient educated on when it is appropriate to go to the emergency department.   Mary-Margaret Daphine Deutscher, FNP

## 2023-04-27 NOTE — Patient Instructions (Signed)
 Earwax Buildup, Adult Your ears make something called earwax. It helps keep germs called bacteria away and protects the skin in your ears. Sometimes, too much earwax can build up. This can cause discomfort or make it harder to hear. What are the causes? Earwax buildup can happen when you have too much earwax in your ears. Earwax is made in the outer part of your ear canal. It's supposed to fall out in small amounts over time. But if your ears aren't able to clean themselves like they should, earwax can build up. What increases the risk? You're more likely to get earwax buildup if: You clean your ears with cotton swabs. You pick at your ears. You use earplugs or in-ear headphones a lot. You wear hearing aids. You may also be more likely to get it if: You're male. You're older. Your ears naturally make more earwax. You have narrow ear canals or extra hair in your ears. Your earwax is too thick or sticky. You have eczema. You're dehydrated. This means there's not enough fluid in your body. What are the signs or symptoms? Symptoms of earwax buildup include: Not being able to hear as well. A feeling of fullness in your ear. Feeling like your ear is plugged. Fluid coming from your ear. Ear pain or an itchy ear. Ringing in your ear. Coughing or problems with balance. How is this diagnosed? Earwax buildup may be diagnosed based on your symptoms, medical history, and an ear exam. During the exam, your health care provider will look into your ear with a tool called an otoscope. You may also have tests, such as a hearing test. How is this treated? Earwax buildup may be treated by: Using ear drops. Having the earwax removed by a provider. The provider may: Flush the ear with water. Use a tool called a curette that has a loop on the end. Use a suction device. Having surgery. This may be done in severe cases. Follow these instructions at home:  Cleaning your ears Clean your ears as told  by your provider. You can clean the outside of your ears with a washcloth or tissue. Do not overclean your ears. Do not put anything into your ear unless told. This includes cotton swabs. General instructions Take over-the-counter and prescription medicines only as told by your provider. Drink enough fluid to keep your pee (urine) pale yellow. This helps thin the earwax. If you have hearing aids, clean them as told. Keep all follow-up visits. If earwax builds up in your ears often or if you use hearing aids, ask your provider how often you should have your ears cleaned. Contact a health care provider if: Your ear pain gets worse. You have a fever. You have pus, blood, or other fluid coming from your ear. You have hearing loss. You have ringing in your ears that won't go away. You feel like the room is spinning. This is called vertigo. Your symptoms don't get better with treatment. This information is not intended to replace advice given to you by your health care provider. Make sure you discuss any questions you have with your health care provider. Document Revised: 05/25/2022 Document Reviewed: 05/25/2022 Elsevier Patient Education  2024 [employer].

## 2023-04-30 ENCOUNTER — Other Ambulatory Visit (HOSPITAL_BASED_OUTPATIENT_CLINIC_OR_DEPARTMENT_OTHER): Payer: Self-pay

## 2023-04-30 ENCOUNTER — Encounter: Payer: Self-pay | Admitting: Nurse Practitioner

## 2023-04-30 ENCOUNTER — Ambulatory Visit: Payer: BC Managed Care – PPO | Admitting: Nurse Practitioner

## 2023-04-30 VITALS — BP 137/92 | HR 85 | Temp 98.2°F | Ht 74.0 in | Wt 233.0 lb

## 2023-04-30 DIAGNOSIS — H6122 Impacted cerumen, left ear: Secondary | ICD-10-CM | POA: Diagnosis not present

## 2023-04-30 DIAGNOSIS — F4320 Adjustment disorder, unspecified: Secondary | ICD-10-CM | POA: Diagnosis not present

## 2023-04-30 DIAGNOSIS — J069 Acute upper respiratory infection, unspecified: Secondary | ICD-10-CM | POA: Diagnosis not present

## 2023-04-30 MED ORDER — BENZONATATE 100 MG PO CAPS
100.0000 mg | ORAL_CAPSULE | Freq: Two times a day (BID) | ORAL | 0 refills | Status: DC | PRN
  Filled 2023-04-30: qty 20, 10d supply, fill #0

## 2023-04-30 NOTE — Patient Instructions (Signed)
 Earwax Buildup, Adult Your ears make something called earwax. It helps keep germs called bacteria away and protects the skin in your ears. Sometimes, too much earwax can build up. This can cause discomfort or make it harder to hear. What are the causes? Earwax buildup can happen when you have too much earwax in your ears. Earwax is made in the outer part of your ear canal. It's supposed to fall out in small amounts over time. But if your ears aren't able to clean themselves like they should, earwax can build up. What increases the risk? You're more likely to get earwax buildup if: You clean your ears with cotton swabs. You pick at your ears. You use earplugs or in-ear headphones a lot. You wear hearing aids. You may also be more likely to get it if: You're male. You're older. Your ears naturally make more earwax. You have narrow ear canals or extra hair in your ears. Your earwax is too thick or sticky. You have eczema. You're dehydrated. This means there's not enough fluid in your body. What are the signs or symptoms? Symptoms of earwax buildup include: Not being able to hear as well. A feeling of fullness in your ear. Feeling like your ear is plugged. Fluid coming from your ear. Ear pain or an itchy ear. Ringing in your ear. Coughing or problems with balance. How is this diagnosed? Earwax buildup may be diagnosed based on your symptoms, medical history, and an ear exam. During the exam, your health care provider will look into your ear with a tool called an otoscope. You may also have tests, such as a hearing test. How is this treated? Earwax buildup may be treated by: Using ear drops. Having the earwax removed by a provider. The provider may: Flush the ear with water. Use a tool called a curette that has a loop on the end. Use a suction device. Having surgery. This may be done in severe cases. Follow these instructions at home:  Cleaning your ears Clean your ears as told  by your provider. You can clean the outside of your ears with a washcloth or tissue. Do not overclean your ears. Do not put anything into your ear unless told. This includes cotton swabs. General instructions Take over-the-counter and prescription medicines only as told by your provider. Drink enough fluid to keep your pee (urine) pale yellow. This helps thin the earwax. If you have hearing aids, clean them as told. Keep all follow-up visits. If earwax builds up in your ears often or if you use hearing aids, ask your provider how often you should have your ears cleaned. Contact a health care provider if: Your ear pain gets worse. You have a fever. You have pus, blood, or other fluid coming from your ear. You have hearing loss. You have ringing in your ears that won't go away. You feel like the room is spinning. This is called vertigo. Your symptoms don't get better with treatment. This information is not intended to replace advice given to you by your health care provider. Make sure you discuss any questions you have with your health care provider. Document Revised: 05/25/2022 Document Reviewed: 05/25/2022 Elsevier Patient Education  2024 [employer].

## 2023-04-30 NOTE — Progress Notes (Signed)
Subjective:    Patient ID: Zachary [name], male    DOB: [birth year], 58 y.o.   MRN: [id number]   Chief Complaint: Left ear clogged up and Cough   Cough This is a new problem. The current episode started yesterday. The problem has been waxing and waning. Associated symptoms include ear congestion, rhinorrhea and shortness of breath. Pertinent negatives include no chills or fever. Nothing aggravates the symptoms. He has tried OTC cough suppressant for the symptoms. The treatment provided mild relief.   Had cerumen impaction last Friday - could not get all wax out. He has been using debrox OTC and ear still feels clogged Patient Active Problem List   Diagnosis Date Noted   Erectile dysfunction due to diabetes mellitus (HCC) 09/05/2022   CKD stage 3 due to type 2 diabetes mellitus (HCC) 09/05/2022   Rosacea 09/05/2022   Depression, recurrent (HCC) 09/05/2022   NAFLD (nonalcoholic fatty liver disease) 01/21/2535   Gastroesophageal reflux disease without esophagitis 07/10/2019   Obesity, Class III, BMI 40-49.9 (morbid obesity) (HCC) 03/30/2017   Type 2 diabetes mellitus with hyperglycemia (HCC) 10/29/2014   Mild intermittent asthma without complication 08/06/2009   Hyperlipidemia associated with type 2 diabetes mellitus (HCC) 08/06/2009   Hypertension associated with diabetes (HCC) 05/01/2007   ADHD (attention deficit hyperactivity disorder), inattentive type 08/29/2006   Binge eating disorder 03/27/1988       Review of Systems  Constitutional:  Positive for fatigue. Negative for chills and fever.  HENT:  Positive for rhinorrhea.   Respiratory:  Positive for cough and shortness of breath.        Objective:   Physical Exam Constitutional:      Appearance: Normal appearance.  HENT:     Right Ear: Tympanic membrane normal.     Left Ear: There is impacted cerumen.     Nose: Congestion and rhinorrhea present.  Cardiovascular:     Rate and Rhythm: Normal rate and regular rhythm.      Heart sounds: Normal heart sounds.  Pulmonary:     Effort: Pulmonary effort is normal.     Breath sounds: Normal breath sounds.     Comments: Dry cough Skin:    General: Skin is warm.  Neurological:     General: No focal deficit present.     Mental Status: He is alert and oriented to person, place, and time.  Psychiatric:        Mood and Affect: Mood normal.        Behavior: Behavior normal.    BP (!) 137/92   Pulse 85   Temp 98.2 F (36.8 C) (Temporal)   Ht 6\' 2"  (1.88 m)   Wt 233 lb (105.7 kg)   SpO2 91%   BMI 29.92 kg/m    S/P ear lavage- TM normal     Assessment & Plan:   Zachary [name] in today with chief complaint of Left ear clogged up and Cough   1. URI with cough and congestion (Primary) 1. Take meds as prescribed 2. Use a cool mist humidifier especially during the winter months and when heat has been humid. 3. Use saline nose sprays frequently 4. Saline irrigations of the nose can be very helpful if done frequently.  * 4X daily for 1 week*  * Use of a nettie pot can be helpful with this. Follow directions with this* 5. Drink plenty of fluids 6. Keep thermostat turn down low 7.For any cough or congestion- tessalon perles 8. For fever or  aces or pains- take tylenol or ibuprofen appropriate for age and weight.  * for fevers greater than 101 orally you may alternate ibuprofen and tylenol every  3 hours.   Meds ordered this encounter  Medications   benzonatate (TESSALON) 100 MG capsule    Sig: Take 1 capsule (100 mg total) by mouth 2 (two) times daily as needed for cough.    Dispense:  20 capsule    Refill:  0    Supervising Provider:   Arville Care A [[id number]]     2. Hearing loss of left ear due to cerumen impaction Continue debrox 3 x a week    The above assessment and management plan was discussed with the patient. The patient verbalized understanding of and has agreed to the management plan. Patient is aware to call the clinic if  symptoms persist or worsen. Patient is aware when to return to the clinic for a follow-up visit. Patient educated on when it is appropriate to go to the emergency department.   Mary-Margaret Daphine Deutscher, FNP

## 2023-05-03 ENCOUNTER — Encounter: Payer: Self-pay | Admitting: Orthopaedic Surgery

## 2023-05-03 ENCOUNTER — Ambulatory Visit: Payer: BC Managed Care – PPO | Admitting: Orthopaedic Surgery

## 2023-05-03 DIAGNOSIS — M25562 Pain in left knee: Secondary | ICD-10-CM | POA: Diagnosis not present

## 2023-05-03 DIAGNOSIS — G8929 Other chronic pain: Secondary | ICD-10-CM | POA: Diagnosis not present

## 2023-05-03 NOTE — Progress Notes (Signed)
 Zachary [name] is doing great now.  He reports that his left knee is asymptomatic.  He is about 3 months more out from a hyaluronic acid injection in that knee.  In the interim he is also significant mount of weight and his blood glucose is under excellent control to the point he actually needs to increase his carbohydrate intake.  Examination of his left knee shows excellent range of motion and no effusion.  It is pain-free.  This point follow-up for his knee can be as needed.  I have known Zachary [name] for most of my life and he knows to reach out to us  and come be seen for any orthopedic issues if he has any issues.

## 2023-05-09 DIAGNOSIS — F4320 Adjustment disorder, unspecified: Secondary | ICD-10-CM | POA: Diagnosis not present

## 2023-05-10 ENCOUNTER — Other Ambulatory Visit (HOSPITAL_COMMUNITY): Payer: Self-pay

## 2023-05-10 DIAGNOSIS — F4320 Adjustment disorder, unspecified: Secondary | ICD-10-CM | POA: Diagnosis not present

## 2023-05-11 DIAGNOSIS — F4320 Adjustment disorder, unspecified: Secondary | ICD-10-CM | POA: Diagnosis not present

## 2023-05-14 ENCOUNTER — Other Ambulatory Visit (HOSPITAL_BASED_OUTPATIENT_CLINIC_OR_DEPARTMENT_OTHER): Payer: Self-pay

## 2023-05-14 DIAGNOSIS — F4320 Adjustment disorder, unspecified: Secondary | ICD-10-CM | POA: Diagnosis not present

## 2023-05-14 MED ORDER — MOUNJARO 7.5 MG/0.5ML ~~LOC~~ SOAJ
7.5000 mg | SUBCUTANEOUS | 0 refills | Status: DC
  Filled 2023-05-14: qty 6, 84d supply, fill #0

## 2023-05-14 MED ORDER — COMIRNATY 30 MCG/0.3ML IM SUSY
0.3000 mL | PREFILLED_SYRINGE | Freq: Once | INTRAMUSCULAR | 0 refills | Status: AC
  Filled 2023-05-14: qty 0.3, 1d supply, fill #0

## 2023-05-15 DIAGNOSIS — F4320 Adjustment disorder, unspecified: Secondary | ICD-10-CM | POA: Diagnosis not present

## 2023-05-18 DIAGNOSIS — F4320 Adjustment disorder, unspecified: Secondary | ICD-10-CM | POA: Diagnosis not present

## 2023-05-22 ENCOUNTER — Other Ambulatory Visit: Payer: Self-pay | Admitting: Family Medicine

## 2023-05-22 DIAGNOSIS — R040 Epistaxis: Secondary | ICD-10-CM

## 2023-05-22 DIAGNOSIS — N183 Chronic kidney disease, stage 3 unspecified: Secondary | ICD-10-CM

## 2023-05-23 ENCOUNTER — Ambulatory Visit: Payer: BC Managed Care – PPO | Admitting: Family Medicine

## 2023-05-24 DIAGNOSIS — F4320 Adjustment disorder, unspecified: Secondary | ICD-10-CM | POA: Diagnosis not present

## 2023-05-25 DIAGNOSIS — F4320 Adjustment disorder, unspecified: Secondary | ICD-10-CM | POA: Diagnosis not present

## 2023-05-28 ENCOUNTER — Other Ambulatory Visit (HOSPITAL_BASED_OUTPATIENT_CLINIC_OR_DEPARTMENT_OTHER): Payer: Self-pay

## 2023-05-29 DIAGNOSIS — F4320 Adjustment disorder, unspecified: Secondary | ICD-10-CM | POA: Diagnosis not present

## 2023-05-30 ENCOUNTER — Encounter: Payer: Self-pay | Admitting: Family Medicine

## 2023-05-30 DIAGNOSIS — F4320 Adjustment disorder, unspecified: Secondary | ICD-10-CM | POA: Diagnosis not present

## 2023-06-05 ENCOUNTER — Encounter: Payer: Self-pay | Admitting: Family Medicine

## 2023-06-06 DIAGNOSIS — F4320 Adjustment disorder, unspecified: Secondary | ICD-10-CM | POA: Diagnosis not present

## 2023-06-08 DIAGNOSIS — F4320 Adjustment disorder, unspecified: Secondary | ICD-10-CM | POA: Diagnosis not present

## 2023-06-14 DIAGNOSIS — F4320 Adjustment disorder, unspecified: Secondary | ICD-10-CM | POA: Diagnosis not present

## 2023-06-15 ENCOUNTER — Encounter: Payer: Self-pay | Admitting: Podiatry

## 2023-06-15 ENCOUNTER — Ambulatory Visit: Admitting: Podiatry

## 2023-06-15 ENCOUNTER — Encounter: Payer: Self-pay | Admitting: Family Medicine

## 2023-06-15 DIAGNOSIS — L600 Ingrowing nail: Secondary | ICD-10-CM | POA: Diagnosis not present

## 2023-06-15 DIAGNOSIS — F4320 Adjustment disorder, unspecified: Secondary | ICD-10-CM | POA: Diagnosis not present

## 2023-06-15 DIAGNOSIS — E1165 Type 2 diabetes mellitus with hyperglycemia: Secondary | ICD-10-CM

## 2023-06-15 NOTE — Progress Notes (Signed)
 Subjective:  Patient ID: Zachary [name], male    DOB: [birth year],   MRN: [id number]  No chief complaint on file.   58 y.o. male presents for concern of bilateral great toenail pain and possible ingrown nails. He relates the toes have always been sore and curved in as they grown. He relates he previously was out of control diabetic and was never able to have the procedure done. Now has his sugars undercotnrol and doing a lot better.  Patient is diabetic and last A1c was  Lab Results  Component Value Date   HGBA1C 4.8 04/19/2023   .   PCP:  Sonny Masters, FNP    . Denies any other pedal complaints. Denies n/v/f/c.   Past Medical History:  Diagnosis Date   ADHD    Asthma    Binge eating disorder    Diabetes mellitus    GERD (gastroesophageal reflux disease)    Gout    Hyperlipidemia    Hypertension    Insomnia    Kidney stones    Low magnesium level    Vitamin D deficiency     Objective:  Physical Exam: Vascular: DP/PT pulses 2/4 bilateral. CFT <3 seconds. Normal hair growth on digits. No edema.  Skin. No lacerations or abrasions bilateral feet. Incurvation of bilateral borders or right hallux and medial border of left hallux No erythema edema or purulence noted. Tender to palpation Musculoskeletal: MMT 5/5 bilateral lower extremities in DF, PF, Inversion and Eversion. Deceased ROM in DF of ankle joint.  Neurological: Sensation intact to light touch.   Assessment:   1. Ingrown left greater toenail   2. Ingrown right greater toenail   3. Type 2 diabetes mellitus with hyperglycemia, without long-term current use of insulin (HCC)      Plan:  Patient was evaluated and treated and all questions answered. Discussed ingrown toenails etiology and treatment options including procedure for removal vs conservative care.  Patient requesting removal of ingrown nail today. Procedure below.  Discussed procedure and post procedure care and patient expressed understanding.  Will  follow-up in 2 weeks for nail check or sooner if any problems arise.    Procedure:  Procedure: partial Nail Avulsion of bilaterally hallux right nail border and medial left hallux nail border Surgeon: Louann Sjogren, DPM  Pre-op Dx: Ingrown toenail without infection Post-op: Same  Place of Surgery: Office exam room.  Indications for surgery: Painful and ingrown toenail.    The patient is requesting removal of nail with  chemical matrixectomy. Risks and complications were discussed with the patient for which they understand and written consent was obtained. Under sterile conditions a total of 3 mL of  1% lidocaine plain was infiltrated in a hallux block fashion. Once anesthetized, the skin was prepped in sterile fashion. A tourniquet was then applied. Next the medial aspect of the left hallux and bilateral right aspect of hallux nail border was then sharply excised making sure to remove the entire offending nail border.  Next phenol was then applied under standard conditions to permanently destroy the matrix and copiously irrigated. Silvadene was applied. A dry sterile dressing was applied. After application of the dressing the tourniquet was removed and there is found to be an immediate capillary refill time to the digit. The patient tolerated the procedure well without any complications. Post procedure instructions were discussed the patient for which he verbally understood. Follow-up in two weeks for nail check or sooner if any problems are to arise. Discussed signs/symptoms of  infection and directed to call the office immediately should any occur or go directly to the emergency room. In the meantime, encouraged to call the office with any questions, concerns, changes symptoms.   Louann Sjogren, DPM

## 2023-06-15 NOTE — Patient Instructions (Signed)

## 2023-06-19 DIAGNOSIS — F4320 Adjustment disorder, unspecified: Secondary | ICD-10-CM | POA: Diagnosis not present

## 2023-06-21 DIAGNOSIS — F4320 Adjustment disorder, unspecified: Secondary | ICD-10-CM | POA: Diagnosis not present

## 2023-06-22 DIAGNOSIS — F4320 Adjustment disorder, unspecified: Secondary | ICD-10-CM | POA: Diagnosis not present

## 2023-06-25 ENCOUNTER — Other Ambulatory Visit: Payer: Self-pay | Admitting: Nephrology

## 2023-06-25 DIAGNOSIS — N1831 Chronic kidney disease, stage 3a: Secondary | ICD-10-CM

## 2023-06-25 DIAGNOSIS — E559 Vitamin D deficiency, unspecified: Secondary | ICD-10-CM | POA: Diagnosis not present

## 2023-06-25 DIAGNOSIS — E1122 Type 2 diabetes mellitus with diabetic chronic kidney disease: Secondary | ICD-10-CM | POA: Diagnosis not present

## 2023-06-25 DIAGNOSIS — I129 Hypertensive chronic kidney disease with stage 1 through stage 4 chronic kidney disease, or unspecified chronic kidney disease: Secondary | ICD-10-CM | POA: Diagnosis not present

## 2023-06-25 DIAGNOSIS — Z1159 Encounter for screening for other viral diseases: Secondary | ICD-10-CM | POA: Diagnosis not present

## 2023-06-25 DIAGNOSIS — N200 Calculus of kidney: Secondary | ICD-10-CM | POA: Diagnosis not present

## 2023-06-28 ENCOUNTER — Encounter: Payer: Self-pay | Admitting: Podiatry

## 2023-06-28 ENCOUNTER — Ambulatory Visit: Admitting: Podiatry

## 2023-06-28 DIAGNOSIS — B351 Tinea unguium: Secondary | ICD-10-CM | POA: Diagnosis not present

## 2023-06-28 DIAGNOSIS — L600 Ingrowing nail: Secondary | ICD-10-CM

## 2023-06-28 DIAGNOSIS — M79674 Pain in right toe(s): Secondary | ICD-10-CM

## 2023-06-28 DIAGNOSIS — M79675 Pain in left toe(s): Secondary | ICD-10-CM

## 2023-06-28 DIAGNOSIS — F4320 Adjustment disorder, unspecified: Secondary | ICD-10-CM | POA: Diagnosis not present

## 2023-06-28 DIAGNOSIS — E1165 Type 2 diabetes mellitus with hyperglycemia: Secondary | ICD-10-CM | POA: Diagnosis not present

## 2023-06-28 NOTE — Progress Notes (Signed)
  Subjective:  Patient ID: Zachary [name], male    DOB: [birth year],   MRN: [id number]  Chief Complaint  Patient presents with   Ingrown Toenail    Pt presents for bil ingrown toenail follow up states he is doing better. Denies any other pedal complaints.    58 y.o. male presents for follow-up for ingrown nail and would also like to have his other toenails trimmed. Relates his great toes are doing well with no issue.  Also oncern of thickened elongated and painful nails that are difficult to trim. Requesting to have them trimmed today. Denies  burning and tingling in their feet. Patient is diabetic and last A1c was  Lab Results  Component Value Date   HGBA1C 4.8 04/19/2023   .   PCP:  Sonny Masters, FNP    . Denies any other pedal complaints. Denies n/v/f/c.   Past Medical History:  Diagnosis Date   ADHD    Asthma    Binge eating disorder    Diabetes mellitus    GERD (gastroesophageal reflux disease)    Gout    Hyperlipidemia    Hypertension    Insomnia    Kidney stones    Low magnesium level    Vitamin D deficiency     Objective:  Physical Exam: Vascular: DP/PT pulses 2/4 bilateral. CFT <3 seconds. Absent hair growth on digits. Edema noted to bilateral lower extremities. Xerosis noted bilaterally.  Skin. No lacerations or abrasions bilateral feet. Nails 1-5 bilateral  are thickened discolored and elongated with subungual debris. Bilateral hallux nails healing well Musculoskeletal: MMT 5/5 bilateral lower extremities in DF, PF, Inversion and Eversion. Deceased ROM in DF of ankle joint.  Neurological: Sensation intact to light touch. Protective sensation slightly diminished bilateral.    Assessment:   1. Type 2 diabetes mellitus with hyperglycemia, without long-term current use of insulin (HCC)   2. Pain due to onychomycosis of toenails of both feet   3. Ingrown left greater toenail   4. Ingrown right greater toenail      Plan:  Patient was evaluated and treated and  all questions answered. Toe was evaluated and appears to be healing well.  May discontinue soaks and neosporin. .  -Discussed and educated patient on diabetic foot care, especially with  regards to the vascular, neurological and musculoskeletal systems.  -Stressed the importance of good glycemic control and the detriment of not  controlling glucose levels in relation to the foot. -Discussed supportive shoes at all times and checking feet regularly.  -Mechanically debrided all nails 1-5 bilateral using sterile nail nipper and filed with dremel without incident  -Answered all patient questions -Patient to return  in 3 months for at risk foot care -Patient advised to call the office if any problems or questions arise in the meantime.   Louann Sjogren, DPM

## 2023-06-29 ENCOUNTER — Other Ambulatory Visit

## 2023-06-29 DIAGNOSIS — F4320 Adjustment disorder, unspecified: Secondary | ICD-10-CM | POA: Diagnosis not present

## 2023-07-02 ENCOUNTER — Ambulatory Visit
Admission: RE | Admit: 2023-07-02 | Discharge: 2023-07-02 | Disposition: A | Discharge status: Home / Self Care | Source: Ambulatory Visit | Attending: Nephrology | Admitting: Nephrology

## 2023-07-02 ENCOUNTER — Other Ambulatory Visit: Payer: Self-pay | Admitting: Family Medicine

## 2023-07-02 ENCOUNTER — Other Ambulatory Visit (HOSPITAL_BASED_OUTPATIENT_CLINIC_OR_DEPARTMENT_OTHER): Payer: Self-pay

## 2023-07-02 DIAGNOSIS — N1831 Chronic kidney disease, stage 3a: Secondary | ICD-10-CM

## 2023-07-02 MED ORDER — MOUNJARO 7.5 MG/0.5ML ~~LOC~~ SOAJ
7.5000 mg | SUBCUTANEOUS | 0 refills | Status: DC
  Filled 2023-07-02 – 2023-07-16 (×3): qty 6, 84d supply, fill #0

## 2023-07-03 ENCOUNTER — Encounter: Payer: Self-pay | Admitting: Family Medicine

## 2023-07-03 ENCOUNTER — Other Ambulatory Visit (HOSPITAL_BASED_OUTPATIENT_CLINIC_OR_DEPARTMENT_OTHER): Payer: Self-pay

## 2023-07-04 ENCOUNTER — Other Ambulatory Visit (HOSPITAL_BASED_OUTPATIENT_CLINIC_OR_DEPARTMENT_OTHER): Payer: Self-pay

## 2023-07-04 ENCOUNTER — Other Ambulatory Visit: Payer: Self-pay

## 2023-07-04 MED ORDER — DAPAGLIFLOZIN PROPANEDIOL 10 MG PO TABS
10.0000 mg | ORAL_TABLET | Freq: Every day | ORAL | 1 refills | Status: DC
  Filled 2023-07-04: qty 90, 90d supply, fill #0

## 2023-07-05 DIAGNOSIS — F4320 Adjustment disorder, unspecified: Secondary | ICD-10-CM | POA: Diagnosis not present

## 2023-07-06 DIAGNOSIS — F4320 Adjustment disorder, unspecified: Secondary | ICD-10-CM | POA: Diagnosis not present

## 2023-07-09 ENCOUNTER — Other Ambulatory Visit (HOSPITAL_BASED_OUTPATIENT_CLINIC_OR_DEPARTMENT_OTHER): Payer: Self-pay

## 2023-07-11 DIAGNOSIS — F4320 Adjustment disorder, unspecified: Secondary | ICD-10-CM | POA: Diagnosis not present

## 2023-07-12 DIAGNOSIS — F4320 Adjustment disorder, unspecified: Secondary | ICD-10-CM | POA: Diagnosis not present

## 2023-07-13 ENCOUNTER — Encounter: Payer: Self-pay | Admitting: Family Medicine

## 2023-07-16 ENCOUNTER — Other Ambulatory Visit (HOSPITAL_BASED_OUTPATIENT_CLINIC_OR_DEPARTMENT_OTHER): Payer: Self-pay

## 2023-07-16 DIAGNOSIS — F4320 Adjustment disorder, unspecified: Secondary | ICD-10-CM | POA: Diagnosis not present

## 2023-07-19 ENCOUNTER — Encounter: Payer: Self-pay | Admitting: Family Medicine

## 2023-07-19 ENCOUNTER — Ambulatory Visit: Admitting: Family Medicine

## 2023-07-19 ENCOUNTER — Encounter: Payer: Self-pay | Admitting: Orthopaedic Surgery

## 2023-07-19 VITALS — BP 129/80 | HR 89 | Temp 97.6°F | Ht 74.0 in | Wt 234.8 lb

## 2023-07-19 DIAGNOSIS — E785 Hyperlipidemia, unspecified: Secondary | ICD-10-CM | POA: Diagnosis not present

## 2023-07-19 DIAGNOSIS — E1169 Type 2 diabetes mellitus with other specified complication: Secondary | ICD-10-CM | POA: Diagnosis not present

## 2023-07-19 DIAGNOSIS — I152 Hypertension secondary to endocrine disorders: Secondary | ICD-10-CM

## 2023-07-19 DIAGNOSIS — N183 Chronic kidney disease, stage 3 unspecified: Secondary | ICD-10-CM

## 2023-07-19 DIAGNOSIS — N521 Erectile dysfunction due to diseases classified elsewhere: Secondary | ICD-10-CM

## 2023-07-19 DIAGNOSIS — E1159 Type 2 diabetes mellitus with other circulatory complications: Secondary | ICD-10-CM | POA: Diagnosis not present

## 2023-07-19 DIAGNOSIS — Z7984 Long term (current) use of oral hypoglycemic drugs: Secondary | ICD-10-CM

## 2023-07-19 DIAGNOSIS — E1122 Type 2 diabetes mellitus with diabetic chronic kidney disease: Secondary | ICD-10-CM

## 2023-07-19 DIAGNOSIS — F4320 Adjustment disorder, unspecified: Secondary | ICD-10-CM | POA: Diagnosis not present

## 2023-07-19 DIAGNOSIS — E1165 Type 2 diabetes mellitus with hyperglycemia: Secondary | ICD-10-CM

## 2023-07-19 LAB — BAYER DCA HB A1C WAIVED: HB A1C (BAYER DCA - WAIVED): 5 % (ref 4.8–5.6)

## 2023-07-19 LAB — LIPID PANEL

## 2023-07-19 MED ORDER — TADALAFIL 20 MG PO TABS: 20.0000 mg | ORAL_TABLET | ORAL | 3 refills | Status: DC | PRN

## 2023-07-19 MED ORDER — AMLODIPINE BESYLATE 5 MG PO TABS: 5.0000 mg | ORAL_TABLET | Freq: Every day | ORAL | 3 refills | Status: AC

## 2023-07-19 NOTE — Progress Notes (Signed)
 Subjective:  Patient ID: Zachary [name], male    DOB: 1966/01/24, 58 y.o.   MRN: [id number]  Patient Care Team: Galvin Jules, FNP as PCP - General (Family Medicine)   Chief Complaint:  Diabetes (3 month follow up )   HPI: Zachary [name] is a 58 y.o. male presenting on 07/19/2023 for Diabetes (3 month follow up )   History of Present Illness   Zachary [name] "Zachary [name]" is a 58 year old male with diabetes and hypertension who presents for routine follow-up.  His blood sugar levels have been stable with his current medication regimen, which includes Farxiga  and Mounjaro  at 7.5 mg. He has started consuming more carbohydrates, approximately two to three servings per week, and consumes one latte in the morning as his only sugary drink.  He notes that his current dose of sildenafil  is not as effective as he would like.  His renal ultrasound showed atrophy of the left kidney, but he has no changes in urine output. His renal function was recently tested with a creatinine level of 1.4. No swelling in his feet or ankles and no changes in his blood pressure, which he has not been monitoring at home. No headaches, chest pain, leg swelling, or shortness of breath.  He has a history of asthma but has not experienced reactive airway disease symptoms in the past two years. He has an albuterol  inhaler but has not needed to use it recently.          Relevant past medical, surgical, family, and social history reviewed and updated as indicated.  Allergies and medications reviewed and updated. Data reviewed: Chart in Epic.   Past Medical History:  Diagnosis Date   ADHD    Asthma    Binge eating disorder    Diabetes mellitus    GERD (gastroesophageal reflux disease)    Gout    Hyperlipidemia    Hypertension    Insomnia    Kidney stones    Low magnesium level    Vitamin D deficiency     Past Surgical History:  Procedure Laterality Date   WISDOM TOOTH EXTRACTION     WRIST SURGERY Right      Social History   Socioeconomic History   Marital status: Married    Spouse name: Not on file   Number of children: Not on file   Years of education: Not on file   Highest education level: Bachelor's degree (e.g., BA, AB, BS)  Occupational History   Not on file  Tobacco Use   Smoking status: Never   Smokeless tobacco: Never  Vaping Use   Vaping status: Never Used  Substance and Sexual Activity   Alcohol use: Yes    Comment: occ   Drug use: No   Sexual activity: Not Currently  Other Topics Concern   Not on file  Social History Narrative   Lives with spouse and kids   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         Social Drivers of Health   Financial Resource Strain: Low Risk  (04/29/2023)   Overall Financial Resource Strain (CARDIA)    Difficulty of Paying Living Expenses: Not hard at all  Food Insecurity: No Food Insecurity (04/29/2023)   Hunger Vital Sign    Worried About Running Out of Food in the Last Year: Never true    Ran Out of Food in the Last Year: Never true  Transportation Needs: No Transportation Needs (  04/29/2023)   PRAPARE - Administrator, Civil Service (Medical): No    Lack of Transportation (Non-Medical): No  Physical Activity: Insufficiently Active (04/29/2023)   Exercise Vital Sign    Days of Exercise per Week: 3 days    Minutes of Exercise per Session: 40 min  Stress: Stress Concern Present (04/29/2023)   [employer] of Occupational Health - Occupational Stress Questionnaire    Feeling of Stress : To some extent  Social Connections: Socially Integrated (04/29/2023)   Social Connection and Isolation Panel [NHANES]    Frequency of Communication with Friends and Family: More than three times a week    Frequency of Social Gatherings with Friends and Family: Three times a week    Attends Religious Services: More than 4 times per year    Active Member of Clubs or Organizations: Yes    Attends Banker  Meetings: More than 4 times per year    Marital Status: Married  Catering manager Violence: Not At Risk (01/31/2022)   Received from [facility], [facility]   HITS    Over the last 12 months how often did your partner physically hurt you?: Never    Over the last 12 months how often did your partner insult you or talk down to you?: Never    Over the last 12 months how often did your partner threaten you with physical harm?: Never    Over the last 12 months how often did your partner scream or curse at you?: Never    Outpatient Encounter Medications as of 07/19/2023  Medication Sig   aspirin 81 MG tablet Take 81 mg by mouth daily.    benzonatate  (TESSALON ) 100 MG capsule Take 1 capsule (100 mg total) by mouth 2 (two) times daily as needed for cough.   buPROPion  (WELLBUTRIN  XL) 300 MG 24 hr tablet Take 1 tablet (300 mg total) by mouth daily.   Continuous Glucose Receiver (FREESTYLE LIBRE 3 READER) DEVI 1 each by Does not apply route in the morning, at noon, in the evening, and at bedtime.   Continuous Glucose Sensor (FREESTYLE LIBRE 3 SENSOR) MISC Place 1 sensor on the skin every 14 days. Use to check glucose continuously   dapagliflozin  propanediol (FARXIGA ) 10 MG TABS tablet Take 1 tablet (10 mg total) by mouth daily.   famotidine  (PEPCID ) 20 MG tablet Take 1 tablet (20 mg total) by mouth 2 (two) times daily.   fluticasone  (FLONASE ) 50 MCG/ACT nasal spray Place 2 sprays into both nostrils daily.   rosuvastatin  (CRESTOR ) 20 MG tablet Take 1 tablet (20 mg total) by mouth daily.   tirzepatide  (MOUNJARO ) 7.5 MG/0.5ML Pen Inject 7.5 mg into the skin once a week.   valsartan  (DIOVAN ) 320 MG tablet Take 1 tablet (320 mg total) by mouth daily.   [DISCONTINUED] amLODipine  (NORVASC ) 5 MG tablet Take 1 tablet (5 mg total) by mouth daily.   [DISCONTINUED] dapagliflozin  propanediol (FARXIGA ) 10 MG TABS tablet Take 1 tablet (10 mg total) by mouth daily.   [DISCONTINUED] tadalafil  (CIALIS ) 20 MG tablet  Take 1 tablet (20 mg total) by mouth every other day as needed for erectile dysfunction.   amLODipine  (NORVASC ) 5 MG tablet Take 1 tablet (5 mg total) by mouth daily.   tadalafil  (CIALIS ) 20 MG tablet Take 1 tablet (20 mg total) by mouth every other day as needed for erectile dysfunction.   [DISCONTINUED] cefdinir  (OMNICEF ) 300 MG capsule Take 1 capsule (300 mg total) by mouth 2 (two)  times daily. 1 po BID   No facility-administered encounter medications on file as of 07/19/2023.    Allergies  Allergen Reactions   Ace Inhibitors     REACTION: cough   Sulfa Antibiotics Other (See Comments)    unk   Sulfamethoxazole-Trimethoprim     REACTION: unspecified    Pertinent ROS per HPI, otherwise unremarkable      Objective:  BP 129/80   Pulse 89   Temp 97.6 F (36.4 C)   Ht 6\' 2"  (1.88 m)   Wt 234 lb 12.8 oz (106.5 kg)   BMI 30.15 kg/m    Wt Readings from Last 3 Encounters:  07/19/23 234 lb 12.8 oz (106.5 kg)  04/30/23 233 lb (105.7 kg)  04/27/23 233 lb (105.7 kg)    Physical Exam Vitals and nursing note reviewed.  Constitutional:      General: He is not in acute distress.    Appearance: Normal appearance. He is obese. He is not ill-appearing, toxic-appearing or diaphoretic.  HENT:     Head: Normocephalic and atraumatic.     Mouth/Throat:     Mouth: Mucous membranes are moist.  Eyes:     Conjunctiva/sclera: Conjunctivae normal.     Pupils: Pupils are equal, round, and reactive to light.  Cardiovascular:     Rate and Rhythm: Normal rate and regular rhythm.     Heart sounds: Normal heart sounds.  Pulmonary:     Effort: Pulmonary effort is normal.     Breath sounds: Normal breath sounds.  Musculoskeletal:     Right lower leg: No edema.     Left lower leg: No edema.  Skin:    General: Skin is warm and dry.     Capillary Refill: Capillary refill takes less than 2 seconds.  Neurological:     General: No focal deficit present.     Mental Status: He is alert and  oriented to person, place, and time.  Psychiatric:        Mood and Affect: Mood normal.        Behavior: Behavior normal.        Thought Content: Thought content normal.        Judgment: Judgment normal.       Results for orders placed or performed in visit on 07/19/23  Bayer DCA Hb A1c Waived   Collection Time: 07/19/23 11:06 AM  Result Value Ref Range   HB A1C (BAYER DCA - WAIVED) 5.0 4.8 - 5.6 %       Pertinent labs & imaging results that were available during my care of the patient were reviewed by me and considered in my medical decision making.  Assessment & Plan:  Zachary [name]" was seen today for diabetes.  Diagnoses and all orders for this visit:  CKD stage 3 due to type 2 diabetes mellitus (HCC) -     CMP14+EGFR  Type 2 diabetes mellitus with hyperglycemia, without long-term current use of insulin  (HCC) -     Bayer DCA Hb A1c Waived  Hyperlipidemia associated with type 2 diabetes mellitus (HCC) -     Lipid panel  Hypertension associated with diabetes (HCC) -     amLODipine  (NORVASC ) 5 MG tablet; Take 1 tablet (5 mg total) by mouth daily.  Erectile dysfunction due to diabetes mellitus (HCC) -     tadalafil  (CIALIS ) 20 MG tablet; Take 1 tablet (20 mg total) by mouth every other day as needed for erectile dysfunction.     Assessment and Plan  Type 2 Diabetes Mellitus Blood glucose levels are well-controlled with an A1c of 5.0. He has slightly increased carbohydrate intake, which is acceptable given the current A1c level. Continues on Farxiga  and Mounjaro . Pharmacist consultation confirmed current Mounjaro  dose is effective for glycemic control and weight management; no need to increase to 10 mg for maintenance.  Hypertension Blood pressure is well-controlled with no symptoms such as headaches, chest pain, leg swelling, or dyspnea. No recent home blood pressure monitoring reported.  Chronic Kidney Disease, unspecified Ultrasound shows left kidney atrophy,  likely age-related and associated with diabetes and hypertension. Renal function is stable with creatinine at 1.4 and no changes in urine output.  Hyperlipidemia Continues on Crestor  with no issues. Recent cholesterol levels were drawn, awaiting results.  Depression Continues on Wellbutrin  with good effect.  Asthma No recent asthma symptoms or need for albuterol . Last episode of reactive airway disease was two years ago. Advised to report if symptoms become bothersome, especially during pollen season. - Re-prescribe albuterol  if asthma symptoms become bothersome.          Continue all other maintenance medications.  Follow up plan: Return if symptoms worsen or fail to improve.   Continue healthy lifestyle choices, including diet (rich in fruits, vegetables, and lean proteins, and low in salt and simple carbohydrates) and exercise (at least 30 minutes of moderate physical activity daily).  Educational handout given for DM  The above assessment and management plan was discussed with the patient. The patient verbalized understanding of and has agreed to the management plan. Patient is aware to call the clinic if they develop any new symptoms or if symptoms persist or worsen. Patient is aware when to return to the clinic for a follow-up visit. Patient educated on when it is appropriate to go to the emergency department.   Zachary Parrot, FNP-C [facility] [phone]

## 2023-07-19 NOTE — Patient Instructions (Signed)

## 2023-07-20 ENCOUNTER — Ambulatory Visit: Payer: BC Managed Care – PPO | Admitting: Family Medicine

## 2023-07-20 DIAGNOSIS — F4320 Adjustment disorder, unspecified: Secondary | ICD-10-CM | POA: Diagnosis not present

## 2023-07-20 LAB — LIPID PANEL
Cholesterol, Total: 151 mg/dL (ref 100–199)
HDL: 58 mg/dL (ref >39)
LDL CALC COMMENT:: 2.6 ratio (ref 0.0–5.0)
LDL Chol Calc (NIH): 78 mg/dL (ref 0–99)
Triglycerides: 79 mg/dL (ref 0–149)
VLDL Cholesterol Cal: 15 mg/dL (ref 5–40)

## 2023-07-20 LAB — CMP14+EGFR
ALT: 37 IU/L (ref 0–44)
AST: 27 IU/L (ref 0–40)
Albumin: 4.6 g/dL (ref 3.8–4.9)
Alkaline Phosphatase: 50 IU/L (ref 44–121)
BUN/Creatinine Ratio: 18 (ref 9–20)
BUN: 23 mg/dL (ref 6–24)
Bilirubin Total: 0.7 mg/dL (ref 0.0–1.2)
CO2: 23 mmol/L (ref 20–29)
Calcium: 9.2 mg/dL (ref 8.7–10.2)
Chloride: 105 mmol/L (ref 96–106)
Creatinine, Ser: 1.29 mg/dL — ABNORMAL HIGH (ref 0.76–1.27)
Globulin, Total: 2.5 g/dL (ref 1.5–4.5)
Glucose: 92 mg/dL (ref 70–99)
Potassium: 4.5 mmol/L (ref 3.5–5.2)
Sodium: 142 mmol/L (ref 134–144)
Total Protein: 7.1 g/dL (ref 6.0–8.5)
eGFR: 65 mL/min/{1.73_m2} (ref >59)

## 2023-07-23 ENCOUNTER — Institutional Professional Consult (permissible substitution) (INDEPENDENT_AMBULATORY_CARE_PROVIDER_SITE_OTHER): Admitting: Otolaryngology

## 2023-07-24 ENCOUNTER — Ambulatory Visit (INDEPENDENT_AMBULATORY_CARE_PROVIDER_SITE_OTHER): Admitting: Otolaryngology

## 2023-07-24 ENCOUNTER — Encounter (INDEPENDENT_AMBULATORY_CARE_PROVIDER_SITE_OTHER): Payer: Self-pay | Admitting: Otolaryngology

## 2023-07-24 VITALS — BP 133/89 | HR 94 | Ht 74.0 in

## 2023-07-24 DIAGNOSIS — J3089 Other allergic rhinitis: Secondary | ICD-10-CM

## 2023-07-24 DIAGNOSIS — R0982 Postnasal drip: Secondary | ICD-10-CM

## 2023-07-24 DIAGNOSIS — J342 Deviated nasal septum: Secondary | ICD-10-CM | POA: Diagnosis not present

## 2023-07-24 DIAGNOSIS — J343 Hypertrophy of nasal turbinates: Secondary | ICD-10-CM

## 2023-07-24 DIAGNOSIS — R0981 Nasal congestion: Secondary | ICD-10-CM

## 2023-07-24 DIAGNOSIS — R040 Epistaxis: Secondary | ICD-10-CM

## 2023-07-24 NOTE — Patient Instructions (Signed)
 Epistaxis prevention instructions given to the patient: - use nasal saline spray x6/day and Vaseline twice a day to prevent nose bleeds - for active nose bleeds use Afrin and nasal tip pressure x 10 min to stop them - if nose bleed does not stop with above measures, please go to Emergency Room  - please see your primary care provider to check your blood pressure and make sure it is under control - return for recurrent nose bleeds and we will consider cautery of your nasal blood vessels  - Purchase BleedStop to have at home and help with epistaxis control

## 2023-07-24 NOTE — Progress Notes (Signed)
 ENT CONSULT:  Reason for Consult: epistaxis  b/l   HPI: Discussed the use of AI scribe software for clinical note transcription with the patient, who gave verbal consent to proceed.  History of Present Illness Zachary [name] "Zachary [name]" is a 58 year old male who presents with recurrent epistaxis.  He has been experiencing recurrent epistaxis from both nares over the past month, occurring intermittently. He suspects a correlation with starting an aspirin regimen. There is no history of recent trauma or nasal cauterization. As a child, he had recurrent epistaxis due to a broken nose from playing basketball.  He manages the epistaxis by applying direct pressure and occasionally using Afrin nasal spray to constrict blood vessels. The last episode occurred the night before the visit, with remnants of clots noted in one of the nares. He has not required nasal packing in the past.  He has a history of well-controlled blood pressure and a recent stable creatinine level after a renal workup. No use of CPAP at night and he is unsure about having seasonal or environmental allergies. He occasionally uses Afrin and Zyrtec, and takes Flonase  on a PRN basis, though he does not find it effective.  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 He shares anecdotes from his career, including experiences with nasal intubations and the use of Vicks VapoRub for bad odors.   Records Reviewed:  PCP office visit 07/19/23 His blood sugar levels have been stable with his current medication regimen, which includes Farxiga  and Mounjaro  at 7.5 mg. He has started consuming more carbohydrates, approximately two to three servings per week, and consumes one latte in the morning as his only sugary drink.   He notes that his current dose of sildenafil  is not as effective as he would like.   His renal ultrasound showed atrophy of the left kidney, but he has no  changes in urine output. His renal function was recently tested with a creatinine level of 1.4. No swelling in his feet or ankles and no changes in his blood pressure, which he has not been monitoring at home. No headaches, chest pain, leg swelling, or shortness of breath.   He has a history of asthma but has not experienced reactive airway disease symptoms in the past two years. He has an albuterol  inhaler but has not needed to use it recently.        Past Medical History:  Diagnosis Date   ADHD    Asthma    Binge eating disorder    Diabetes mellitus    GERD (gastroesophageal reflux disease)    Gout    Hyperlipidemia    Hypertension    Insomnia    Kidney stones    Low magnesium level    Vitamin D deficiency     Past Surgical History:  Procedure Laterality Date   WISDOM TOOTH EXTRACTION     WRIST SURGERY Right     Family History  Problem Relation Age of Onset   Breast cancer Mother    Hypertension Mother    Pneumonia Mother    Crohn's disease Mother    Leukemia Father    GER disease Father    Macular degeneration Father    Alcohol abuse Brother    ADD / ADHD Son    Stroke Maternal Grandmother    Stroke Paternal Grandmother     Social History:  reports that he has never smoked. He has never used  smokeless tobacco. He reports current alcohol use. He reports that he does not use drugs.  Allergies:  Allergies  Allergen Reactions   Ace Inhibitors     REACTION: cough   Sulfa Antibiotics Other (See Comments)    unk   Sulfamethoxazole-Trimethoprim     REACTION: unspecified    Medications: I have reviewed the patient's current medications.   The PMH, PSH, Medications, Allergies, and SH were reviewed and updated.  ROS: Constitutional: Negative for fever, weight loss and weight gain. Cardiovascular: Negative for chest pain and dyspnea on exertion. Respiratory: Is not experiencing shortness of breath at rest. Gastrointestinal: Negative for nausea and  vomiting. Neurological: Negative for headaches. Psychiatric: The patient is not nervous/anxious  Blood pressure 133/89, pulse 94, height 6\' 2"  (1.88 m), SpO2 99%.  PHYSICAL EXAM:  Exam: General: Well-developed, well-nourished  Respiratory Respiratory effort: Equal inspiration and expiration without stridor Cardiovascular Peripheral Vascular: Warm extremities with equal color/perfusion Eyes: No nystagmus with equal extraocular motion bilaterally Neuro/Psych/Balance: Patient oriented to person, place, and time; Appropriate mood and affect; Gait is intact with no imbalance; Cranial nerves I-XII are intact Head and Face Inspection: Normocephalic and atraumatic without mass or lesion Palpation: Facial skeleton intact without bony stepoffs Salivary Glands: No mass or tenderness Facial Strength: Facial motility symmetric and full bilaterally ENT Pinna: External ear intact and fully developed External canal: Canal is patent with intact skin Tympanic Membrane: Clear and mobile External Nose: No scar or anatomic deformity Internal Nose: Septum is S-shaped and deviated with narrowing of R > L nasal passages. No polyp, or purulence. Mucosal edema and erythema present. No blood or clot. R anterior septum with a few prominent blood vessels.  Bilateral inferior turbinate hypertrophy.  Lips, Teeth, and gums: Mucosa and teeth intact and viable TMJ: No pain to palpation with full mobility Oral cavity/oropharynx: No erythema or exudate, no lesions present Nasopharynx: No mass or lesion with intact mucosa Neck Neck and Trachea: Midline trachea without mass or lesion Thyroid : No mass or nodularity Lymphatics: No lymphadenopathy  Procedure:   PROCEDURE NOTE: nasal endoscopy  Preoperative diagnosis: recurrent epistaxis b/l  Postoperative diagnosis: same  Procedure: Diagnostic nasal endoscopy (82956)  Surgeon: Artice Last, M.D.  Anesthesia: Topical lidocaine  and Afrin  H&P REVIEW: The  patient's history and physical were reviewed today prior to procedure. All medications were reviewed and updated as well. Complications: None Condition is stable throughout exam Indications and consent: The patient presents with symptoms of chronic sinusitis not responding to previous therapies. All the risks, benefits, and potential complications were reviewed with the patient preoperatively and informed consent was obtained. The time out was completed with confirmation of the correct procedure.   Procedure: The patient was seated upright in the clinic. Topical lidocaine  and Afrin were applied to the nasal cavity. After adequate anesthesia had occurred, the rigid nasal endoscope was passed into the nasal cavity. The nasal mucosa, turbinates, septum, and sinus drainage pathways were visualized bilaterally. This revealed no purulence or significant secretions that might be cultured. There were no polyps or sites of significant inflammation. The mucosa was intact and there was no crusting present. The scope was then slowly withdrawn and the patient tolerated the procedure well. There were no complications or blood loss.    Studies Reviewed: CT neck w/con 10/06/22 IMPRESSION: Normal CT of the neck. No enlarged or abnormal density lymph nodes present on either side of the neck. Normal bilateral cervical chain nodes.   Assessment/Plan: Encounter Diagnoses  Name Primary?  Epistaxis Yes   Environmental and seasonal allergies    Chronic nasal congestion    Nasal septal deviation    Hypertrophy of both inferior nasal turbinates    Post-nasal drip     Assessment and Plan Assessment & Plan Recurrent nosebleeds low-volume, b/l Recurrent epistaxis likely due to aspirin use, nasal dryness, and prominent septal vessels/septal deviation. No polyps, masses, or tumors on nasal endoscopy but evidence of S-shaped septum/ITH and right side with a few prominent blood vessels.. Blood pressure well controlled  per report. He never needed cautery or nasal packing. Discussed silver nitrate cauterization right side; he opted to monitor and will consider doing it in the future.  - Advised saline nasal spray and Vaseline application. - Continue Afrin during active epistaxis with 10-minute direct pressure. - Avoid Flonase .  - Consider silver nitrate cauterization if epistaxis persists (R side). - Provide after-visit summary with epistaxis management instructions.  Deviated nasal septum ITH Septal deviation with leftward S-shaped configuration and right spur causing congestion and altered airflow, predisposing to epistaxis. - Advise humidifier use to prevent nasal dryness at night  - epistaxis prevention instructions provided    Thank you for allowing me to participate in the care of this patient. Please do not hesitate to contact me with any questions or concerns.   Artice Last, MD Otolaryngology [facility] Phone: [phone] Fax: [phone]    07/24/2023, 2:30 PM

## 2023-07-25 DIAGNOSIS — F4320 Adjustment disorder, unspecified: Secondary | ICD-10-CM | POA: Diagnosis not present

## 2023-07-31 ENCOUNTER — Other Ambulatory Visit (HOSPITAL_BASED_OUTPATIENT_CLINIC_OR_DEPARTMENT_OTHER): Payer: Self-pay

## 2023-08-01 ENCOUNTER — Other Ambulatory Visit (HOSPITAL_BASED_OUTPATIENT_CLINIC_OR_DEPARTMENT_OTHER): Payer: Self-pay

## 2023-08-01 ENCOUNTER — Encounter: Payer: Self-pay | Admitting: Family Medicine

## 2023-08-01 ENCOUNTER — Ambulatory Visit: Admitting: Family Medicine

## 2023-08-01 VITALS — BP 125/84 | HR 90 | Temp 97.7°F | Ht 74.0 in | Wt 229.4 lb

## 2023-08-01 DIAGNOSIS — R110 Nausea: Secondary | ICD-10-CM

## 2023-08-01 DIAGNOSIS — R197 Diarrhea, unspecified: Secondary | ICD-10-CM | POA: Diagnosis not present

## 2023-08-01 MED ORDER — ONDANSETRON 4 MG PO TBDP
4.0000 mg | ORAL_TABLET | Freq: Three times a day (TID) | ORAL | 0 refills | Status: AC | PRN
  Filled 2023-08-01: qty 30, 10d supply, fill #0

## 2023-08-01 MED ORDER — AZITHROMYCIN 500 MG PO TABS
500.0000 mg | ORAL_TABLET | Freq: Every day | ORAL | 0 refills | Status: AC
Start: 2023-08-01 — End: 2023-08-04
  Filled 2023-08-01: qty 3, 3d supply, fill #0

## 2023-08-01 NOTE — Progress Notes (Signed)
 Subjective:  Patient ID: Zachary [name], male    DOB: [birth year], 58 y.o.   MRN: [id number]  Patient Care Team: Galvin Jules, FNP as PCP - General (Family Medicine)   Chief Complaint:  Abdominal Pain, Nausea, and Diarrhea (X 3 days )   HPI: Zachary [name] is a 58 y.o. male presenting on 08/01/2023 for Abdominal Pain, Nausea, and Diarrhea (X 3 days )   History of Present Illness   Zachary [name] "Zachary [name]" is a 58 year old male who presents with diarrhea and nausea.  He has been experiencing diarrhea for the past three days, which began after consuming scallops at a restaurant in [place]. The diarrhea is frequent and accompanied by stomach cramps. No fever or blood in the stool. He notes that part of the stool contained black rice, which he associates with the meal he had.  He also experiences nausea, which he manages with Zofran, effectively alleviating the nausea without adverse effects. No vomiting is reported.  He mentions a lack of appetite and has not been eating much over the past three days, resulting in some weight loss. He confirms staying hydrated and urinating more than three times a day.  He denies any recent changes in medication, dietary habits, or travel outside the states, except for the trip to [place]. He is currently taking Mounjaro  at a dose of 7.5 mg, which typically causes constipation rather than diarrhea. He has been using Imodium in small quantities to manage the diarrhea.        Relevant past medical, surgical, family, and social history reviewed and updated as indicated.  Allergies and medications reviewed and updated. Data reviewed: Chart in Epic.   Past Medical History:  Diagnosis Date   ADHD    Asthma    Binge eating disorder    Diabetes mellitus    GERD (gastroesophageal reflux disease)    Gout    Hyperlipidemia    Hypertension    Insomnia    Kidney stones    Low magnesium level    Vitamin D deficiency     Past Surgical History:   Procedure Laterality Date   WISDOM TOOTH EXTRACTION     WRIST SURGERY Right     Social History   Socioeconomic History   Marital status: Married    Spouse name: Not on file   Number of children: Not on file   Years of education: Not on file   Highest education level: Bachelor's degree (e.g., BA, AB, BS)  Occupational History   Not on file  Tobacco Use   Smoking status: Never   Smokeless tobacco: Never  Vaping Use   Vaping status: Never Used  Substance and Sexual Activity   Alcohol use: Yes    Comment: occ   Drug use: No   Sexual activity: Not Currently  Other Topics Concern   Not on file  Social History Narrative   Lives with spouse and kids   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         Social Drivers of Health   Financial Resource Strain: Low Risk  (04/29/2023)   Overall Financial Resource Strain (CARDIA)    Difficulty of Paying Living Expenses: Not hard at all  Food Insecurity: No Food Insecurity (04/29/2023)   Hunger Vital Sign    Worried About Running Out of Food in the Last Year: Never true    Ran Out of Food in the Last Year: Never true  Transportation Needs: No Transportation Needs (04/29/2023)   PRAPARE - Administrator, Civil Service (Medical): No    Lack of Transportation (Non-Medical): No  Physical Activity: Insufficiently Active (04/29/2023)   Exercise Vital Sign    Days of Exercise per Week: 3 days    Minutes of Exercise per Session: 40 min  Stress: Stress Concern Present (04/29/2023)   [employer] of Occupational Health - Occupational Stress Questionnaire    Feeling of Stress : To some extent  Social Connections: Socially Integrated (04/29/2023)   Social Connection and Isolation Panel [NHANES]    Frequency of Communication with Friends and Family: More than three times a week    Frequency of Social Gatherings with Friends and Family: Three times a week    Attends Religious Services: More than 4 times per year     Active Member of Clubs or Organizations: Yes    Attends Banker Meetings: More than 4 times per year    Marital Status: Married  Catering manager Violence: Not At Risk (01/31/2022)   Received from [facility], [facility]   HITS    Over the last 12 months how often did your partner physically hurt you?: Never    Over the last 12 months how often did your partner insult you or talk down to you?: Never    Over the last 12 months how often did your partner threaten you with physical harm?: Never    Over the last 12 months how often did your partner scream or curse at you?: Never    Outpatient Encounter Medications as of 08/01/2023  Medication Sig   amLODipine  (NORVASC ) 5 MG tablet Take 1 tablet (5 mg total) by mouth daily.   aspirin 81 MG tablet Take 81 mg by mouth daily.    azithromycin  (ZITHROMAX ) 500 MG tablet Take 1 tablet (500 mg total) by mouth daily for 3 days.   buPROPion  (WELLBUTRIN  XL) 300 MG 24 hr tablet Take 1 tablet (300 mg total) by mouth daily.   Continuous Glucose Receiver (FREESTYLE LIBRE 3 READER) DEVI 1 each by Does not apply route in the morning, at noon, in the evening, and at bedtime.   Continuous Glucose Sensor (FREESTYLE LIBRE 3 SENSOR) MISC Place 1 sensor on the skin every 14 days. Use to check glucose continuously   dapagliflozin  propanediol (FARXIGA ) 10 MG TABS tablet Take 1 tablet (10 mg total) by mouth daily.   famotidine  (PEPCID ) 20 MG tablet Take 1 tablet (20 mg total) by mouth 2 (two) times daily.   fluticasone  (FLONASE ) 50 MCG/ACT nasal spray Place 2 sprays into both nostrils daily.   ondansetron (ZOFRAN-ODT) 4 MG disintegrating tablet Take 1 tablet (4 mg total) by mouth every 8 (eight) hours as needed for nausea or vomiting.   rosuvastatin  (CRESTOR ) 20 MG tablet Take 1 tablet (20 mg total) by mouth daily.   tadalafil  (CIALIS ) 20 MG tablet Take 1 tablet (20 mg total) by mouth every other day as needed for erectile dysfunction.   tirzepatide   (MOUNJARO ) 7.5 MG/0.5ML Pen Inject 7.5 mg into the skin once a week.   valsartan  (DIOVAN ) 320 MG tablet Take 1 tablet (320 mg total) by mouth daily.   [DISCONTINUED] benzonatate  (TESSALON ) 100 MG capsule Take 1 capsule (100 mg total) by mouth 2 (two) times daily as needed for cough. (Patient not taking: Reported on 07/24/2023)   No facility-administered encounter medications on file as of 08/01/2023.    Allergies  Allergen Reactions   Ace  Inhibitors     REACTION: cough   Sulfa Antibiotics Other (See Comments)    unk   Sulfamethoxazole-Trimethoprim     REACTION: unspecified    Pertinent ROS per HPI, otherwise unremarkable      Objective:  BP 125/84   Pulse 90   Temp 97.7 F (36.5 C)   Ht 6\' 2"  (1.88 m)   Wt 229 lb 6.4 oz (104.1 kg)   SpO2 96%   BMI 29.45 kg/m    Wt Readings from Last 3 Encounters:  08/01/23 229 lb 6.4 oz (104.1 kg)  07/19/23 234 lb 12.8 oz (106.5 kg)  04/30/23 233 lb (105.7 kg)    Physical Exam Vitals and nursing note reviewed.  Constitutional:      General: He is not in acute distress.    Appearance: Normal appearance. He is well-developed, well-groomed and overweight. He is not ill-appearing, toxic-appearing or diaphoretic.  HENT:     Head: Normocephalic and atraumatic.     Nose: Nose normal.     Mouth/Throat:     Mouth: Mucous membranes are moist.  Eyes:     Conjunctiva/sclera: Conjunctivae normal.     Pupils: Pupils are equal, round, and reactive to light.  Cardiovascular:     Rate and Rhythm: Normal rate.  Pulmonary:     Effort: Pulmonary effort is normal.  Musculoskeletal:     Cervical back: Neck supple.  Skin:    General: Skin is warm and dry.     Capillary Refill: Capillary refill takes less than 2 seconds.  Neurological:     General: No focal deficit present.     Mental Status: He is alert and oriented to person, place, and time.  Psychiatric:        Mood and Affect: Mood normal.        Behavior: Behavior normal. Behavior is  cooperative.        Thought Content: Thought content normal.        Judgment: Judgment normal.      Results for orders placed or performed in visit on 07/19/23  Bayer DCA Hb A1c Waived   Collection Time: 07/19/23 11:06 AM  Result Value Ref Range   HB A1C (BAYER DCA - WAIVED) 5.0 4.8 - 5.6 %  CMP14+EGFR   Collection Time: 07/19/23 11:08 AM  Result Value Ref Range   Glucose 92 70 - 99 mg/dL   BUN 23 6 - 24 mg/dL   Creatinine, Ser 1.47 (H) 0.76 - 1.27 mg/dL   eGFR 65 >82 NF/AOZ/3.08   BUN/Creatinine Ratio 18 9 - 20   Sodium 142 134 - 144 mmol/L   Potassium 4.5 3.5 - 5.2 mmol/L   Chloride 105 96 - 106 mmol/L   CO2 23 20 - 29 mmol/L   Calcium  9.2 8.7 - 10.2 mg/dL   Total Protein 7.1 6.0 - 8.5 g/dL   Albumin 4.6 3.8 - 4.9 g/dL   Globulin, Total 2.5 1.5 - 4.5 g/dL   Bilirubin Total 0.7 0.0 - 1.2 mg/dL   Alkaline Phosphatase 50 44 - 121 IU/L   AST 27 0 - 40 IU/L   ALT 37 0 - 44 IU/L  Lipid panel   Collection Time: 07/19/23 11:08 AM  Result Value Ref Range   Cholesterol, Total 151 100 - 199 mg/dL   Triglycerides 79 0 - 149 mg/dL   HDL 58 >65 mg/dL   VLDL Cholesterol Cal 15 5 - 40 mg/dL   LDL Chol Calc (NIH) 78 0 - 99 mg/dL  Chol/HDL Ratio 2.6 0.0 - 5.0 ratio       Pertinent labs & imaging results that were available during my care of the patient were reviewed by me and considered in my medical decision making.  Assessment & Plan:  Zachary "Zachary [name]" was seen today for abdominal pain, nausea and diarrhea.  Diagnoses and all orders for this visit:  Diarrhea of presumed infectious origin -     azithromycin  (ZITHROMAX ) 500 MG tablet; Take 1 tablet (500 mg total) by mouth daily for 3 days.  Nausea -     ondansetron (ZOFRAN-ODT) 4 MG disintegrating tablet; Take 1 tablet (4 mg total) by mouth every 8 (eight) hours as needed for nausea or vomiting.      Infectious diarrhea Acute onset of diarrhea for three days, likely due to food poisoning from scallops consumed in [place].  No fever, blood in stool, recent travel outside the states, or antibiotic use. No dietary changes except seafood. No significant colitis or diverticulitis, thus not requiring Cipro and Flagyl . Not contagious. - Prescribe Azithromycin  500 mg for three days. - Advise hydration and monitor urine output. - Recommend BRAT diet (bananas, rice, applesauce, toast) to manage symptoms. - Suggest Metamucil or Benafiber powder to bulk stools. - Allow Imodium in small quantities if necessary.  Nausea Nausea associated with diarrhea, managed with Zofran. No vomiting or adverse effects from Zofran. - Prescribe Zofran ODT as needed for nausea.          Continue all other maintenance medications.  Follow up plan: Return if symptoms worsen or fail to improve.   Continue healthy lifestyle choices, including diet (rich in fruits, vegetables, and lean proteins, and low in salt and simple carbohydrates) and exercise (at least 30 minutes of moderate physical activity daily).  Educational handout given for diarrhea   The above assessment and management plan was discussed with the patient. The patient verbalized understanding of and has agreed to the management plan. Patient is aware to call the clinic if they develop any new symptoms or if symptoms persist or worsen. Patient is aware when to return to the clinic for a follow-up visit. Patient educated on when it is appropriate to go to the emergency department.   Kattie Parrot, FNP-C [facility] [phone]

## 2023-08-03 DIAGNOSIS — F4320 Adjustment disorder, unspecified: Secondary | ICD-10-CM | POA: Diagnosis not present

## 2023-08-07 DIAGNOSIS — F4320 Adjustment disorder, unspecified: Secondary | ICD-10-CM | POA: Diagnosis not present

## 2023-08-08 ENCOUNTER — Ambulatory Visit: Admitting: Nurse Practitioner

## 2023-08-08 ENCOUNTER — Encounter: Payer: Self-pay | Admitting: Nurse Practitioner

## 2023-08-08 VITALS — BP 122/82 | HR 94 | Temp 97.5°F | Ht 74.0 in | Wt 234.8 lb

## 2023-08-08 DIAGNOSIS — R110 Nausea: Secondary | ICD-10-CM | POA: Insufficient documentation

## 2023-08-08 DIAGNOSIS — R197 Diarrhea, unspecified: Secondary | ICD-10-CM | POA: Diagnosis not present

## 2023-08-08 NOTE — Progress Notes (Signed)
 Acute Office Visit  Subjective:     Patient ID: Zachary [name], male    DOB: 1965-11-23, 58 y.o.   MRN: [id number]  Patient Care Team: Galvin Jules, FNP as PCP - General (Family Medicine)   Chief Complaint  Patient presents with   Abdominal Pain    Dx with food poisoning last week, symptoms got better but symptoms are back now, stomach discomfort, nausea, diarrhea     HPI Zachary [name] is a 58 year old male who presents on 08/08/2023 for an acute visit due to recurrence of diarrhea. He was seen by his PCP on 08/01/2023 for suspected food poisoning and was prescribed a 3-day course of Zithromax  (azithromycin ). The patient reports that his symptoms initially improved following completion of the antibiotic; however, he began experiencing loose stools again yesterday, along with nausea and belching. He denies fever or vomiting.  Relevant past medical, surgical, family, and social history reviewed and updated as indicated.  Allergies and medications reviewed and updated. Data reviewed: Chart in Epic.  Active Ambulatory Problems    Diagnosis Date Noted   Hypertension associated with diabetes (HCC) 05/01/2007   ADHD (attention deficit hyperactivity disorder), inattentive type 08/29/2006   Type 2 diabetes mellitus with hyperglycemia (HCC) 10/29/2014   Obesity, Class III, BMI 40-49.9 (morbid obesity) 03/30/2017   Gastroesophageal reflux disease without esophagitis 07/10/2019   Mild intermittent asthma without complication 08/06/2009   NAFLD (nonalcoholic fatty liver disease) 64/40/3474   Hyperlipidemia associated with type 2 diabetes mellitus (HCC) 08/06/2009   Binge eating disorder 03/27/1988   Erectile dysfunction due to diabetes mellitus (HCC) 09/05/2022   CKD stage 3 due to type 2 diabetes mellitus (HCC) 09/05/2022   Rosacea 09/05/2022   Depression, recurrent (HCC) 09/05/2022   Nausea 08/08/2023   Diarrhea of presumed infectious origin 08/08/2023   Resolved Ambulatory Problems     Diagnosis Date Noted   Hyperlipidemia 05/01/2007   GOUT 11/02/2008   EUSTACHIAN TUBE DYSFUNCTION 08/29/2006   Asthma 08/29/2006   RECTAL BLEEDING 08/16/2007   Scrotal varicose veins 02/24/2014   Kidney stones 09/24/2014   TFCC (triangular fibrocartilage complex) injury, left, subsequent encounter 10/05/2016   Pain in left wrist 12/06/2016   Past Medical History:  Diagnosis Date   ADHD    Asthma    Diabetes mellitus    GERD (gastroesophageal reflux disease)    Gout    Hypertension    Insomnia    Low magnesium level    Vitamin D deficiency      Review of Systems  Constitutional:  Negative for chills and fever.  HENT:  Negative for congestion and sore throat.   Gastrointestinal:  Positive for diarrhea and nausea. Negative for vomiting.  Neurological:  Negative for dizziness and headaches.   Negative unless indicated in HPI    Objective:    BP 122/82   Pulse 94   Temp (!) 97.5 F (36.4 C) (Temporal)   Ht 6\' 2"  (1.88 m)   Wt 234 lb 12.8 oz (106.5 kg)   SpO2 98%   BMI 30.15 kg/m  BP Readings from Last 3 Encounters:  08/08/23 122/82  08/01/23 125/84  07/24/23 133/89   Wt Readings from Last 3 Encounters:  08/08/23 234 lb 12.8 oz (106.5 kg)  08/01/23 229 lb 6.4 oz (104.1 kg)  07/19/23 234 lb 12.8 oz (106.5 kg)    Physical Exam Vitals and nursing note reviewed.  HENT:     Head: Normocephalic and atraumatic.     Nose: Nose normal.  Mouth/Throat:     Mouth: Mucous membranes are moist.  Eyes:     General: No scleral icterus.    Extraocular Movements: Extraocular movements intact.     Conjunctiva/sclera: Conjunctivae normal.     Pupils: Pupils are equal, round, and reactive to light.  Cardiovascular:     Heart sounds: Normal heart sounds.  Pulmonary:     Effort: Pulmonary effort is normal.     Breath sounds: Normal breath sounds.  Abdominal:     General: Bowel sounds are normal. There is no distension.     Palpations: Abdomen is soft. There is no  mass.     Tenderness: There is no abdominal tenderness. There is no guarding or rebound.  Musculoskeletal:        General: Normal range of motion.     Right lower leg: No edema.     Left lower leg: No edema.  Skin:    General: Skin is warm and dry.     Findings: No rash.  Neurological:     Mental Status: He is alert and oriented to person, place, and time.     Gait: Gait is intact.     No results found for any visits on 08/08/23.      Assessment & Plan:  Diarrhea of presumed infectious origin -     Cdiff NAA+O+P+Stool Culture  Nausea  Zachary [name] is a 58 year old Caucasian male seen today for diarrhea, no acute distress  diarrhea: stool culture Nausea: Continue previously prescribed Zofran  PRN -Increase hydration, Encourage BRAT Diet (bananas, Rice, applesauce, toast)  The above assessment and management plan was discussed with the patient. The patient verbalized understanding of and has agreed to the management plan. Patient is aware to call the clinic if they develop any new symptoms or if symptoms persist or worsen. Patient is aware when to return to the clinic for a follow-up visit. Patient educated on when it is appropriate to go to the emergency department.  Return if symptoms worsen or fail to improve.  Monick Rena [place] Thompson, DNP [facility] [address] [place], [place] [address] [phone]  Note: This document was prepared by [employer] voice dictation technology and any errors that results from this process are unintentional.

## 2023-08-11 ENCOUNTER — Encounter: Payer: Self-pay | Admitting: Family Medicine

## 2023-08-14 DIAGNOSIS — F4320 Adjustment disorder, unspecified: Secondary | ICD-10-CM | POA: Diagnosis not present

## 2023-08-16 ENCOUNTER — Other Ambulatory Visit

## 2023-08-16 DIAGNOSIS — R197 Diarrhea, unspecified: Secondary | ICD-10-CM | POA: Diagnosis not present

## 2023-08-20 LAB — CDIFF NAA+O+P+STOOL CULTURE
E coli, Shiga toxin Assay: NEGATIVE
Toxigenic C. Difficile by PCR: NEGATIVE

## 2023-08-21 ENCOUNTER — Encounter: Payer: Self-pay | Admitting: Family Medicine

## 2023-08-21 ENCOUNTER — Ambulatory Visit: Payer: Self-pay | Admitting: Nurse Practitioner

## 2023-08-22 ENCOUNTER — Other Ambulatory Visit (HOSPITAL_BASED_OUTPATIENT_CLINIC_OR_DEPARTMENT_OTHER): Payer: Self-pay

## 2023-08-24 ENCOUNTER — Ambulatory Visit: Admitting: Family Medicine

## 2023-08-24 DIAGNOSIS — F4320 Adjustment disorder, unspecified: Secondary | ICD-10-CM | POA: Diagnosis not present

## 2023-08-28 DIAGNOSIS — F4320 Adjustment disorder, unspecified: Secondary | ICD-10-CM | POA: Diagnosis not present

## 2023-08-29 ENCOUNTER — Encounter: Payer: Self-pay | Admitting: Family Medicine

## 2023-08-29 ENCOUNTER — Encounter: Payer: Self-pay | Admitting: Orthopaedic Surgery

## 2023-09-05 DIAGNOSIS — F4320 Adjustment disorder, unspecified: Secondary | ICD-10-CM | POA: Diagnosis not present

## 2023-09-12 DIAGNOSIS — F4320 Adjustment disorder, unspecified: Secondary | ICD-10-CM | POA: Diagnosis not present

## 2023-09-21 ENCOUNTER — Other Ambulatory Visit (HOSPITAL_BASED_OUTPATIENT_CLINIC_OR_DEPARTMENT_OTHER): Payer: Self-pay

## 2023-09-21 DIAGNOSIS — F4320 Adjustment disorder, unspecified: Secondary | ICD-10-CM | POA: Diagnosis not present

## 2023-09-21 MED ORDER — FREESTYLE LIBRE 3 SENSOR MISC
5 refills | Status: AC
  Filled 2023-09-21: qty 2, 28d supply, fill #0

## 2023-09-26 DIAGNOSIS — F4320 Adjustment disorder, unspecified: Secondary | ICD-10-CM | POA: Diagnosis not present

## 2023-10-02 ENCOUNTER — Other Ambulatory Visit (HOSPITAL_BASED_OUTPATIENT_CLINIC_OR_DEPARTMENT_OTHER): Payer: Self-pay

## 2023-10-02 DIAGNOSIS — F4320 Adjustment disorder, unspecified: Secondary | ICD-10-CM | POA: Diagnosis not present

## 2023-10-03 ENCOUNTER — Other Ambulatory Visit (HOSPITAL_BASED_OUTPATIENT_CLINIC_OR_DEPARTMENT_OTHER): Payer: Self-pay

## 2023-10-04 ENCOUNTER — Encounter: Payer: Self-pay | Admitting: Family Medicine

## 2023-10-04 ENCOUNTER — Ambulatory Visit: Admitting: Podiatry

## 2023-10-04 ENCOUNTER — Telehealth: Payer: Self-pay

## 2023-10-04 ENCOUNTER — Other Ambulatory Visit (HOSPITAL_BASED_OUTPATIENT_CLINIC_OR_DEPARTMENT_OTHER): Payer: Self-pay

## 2023-10-04 ENCOUNTER — Encounter: Payer: Self-pay | Admitting: Podiatry

## 2023-10-04 DIAGNOSIS — E1165 Type 2 diabetes mellitus with hyperglycemia: Secondary | ICD-10-CM | POA: Diagnosis not present

## 2023-10-04 DIAGNOSIS — M79675 Pain in left toe(s): Secondary | ICD-10-CM

## 2023-10-04 DIAGNOSIS — B351 Tinea unguium: Secondary | ICD-10-CM | POA: Diagnosis not present

## 2023-10-04 DIAGNOSIS — M79674 Pain in right toe(s): Secondary | ICD-10-CM

## 2023-10-04 NOTE — Progress Notes (Signed)
  Subjective:  Patient ID: Zachary [name], male    DOB: [birth year],   MRN: [id number]  No chief complaint on file.   58 y.o. male presents for concern of thickened elongated and painful nails that are difficult to trim. Requesting to have them trimmed today. Denies  burning and tingling in their feet. Patient is diabetic and last A1c was  Lab Results  Component Value Date   HGBA1C 5.0 07/19/2023   .   PCP:  Severa Rock HERO, FNP    . Denies any other pedal complaints. Denies n/v/f/c.   Past Medical History:  Diagnosis Date   ADHD    Asthma    Binge eating disorder    Diabetes mellitus    GERD (gastroesophageal reflux disease)    Gout    Hyperlipidemia    Hypertension    Insomnia    Kidney stones    Low magnesium level    Vitamin D deficiency     Objective:  Physical Exam: Vascular: DP/PT pulses 2/4 bilateral. CFT <3 seconds. Absent hair growth on digits. Edema noted to bilateral lower extremities. Xerosis noted bilaterally.  Skin. No lacerations or abrasions bilateral feet. Nails 1-5 bilateral  are thickened discolored and elongated with subungual debris. Bilateral hallux nails healing well Musculoskeletal: MMT 5/5 bilateral lower extremities in DF, PF, Inversion and Eversion. Deceased ROM in DF of ankle joint.  Neurological: Sensation intact to light touch. Protective sensation slightly diminished bilateral.    Assessment:   1. Pain due to onychomycosis of toenails of both feet   2. Type 2 diabetes mellitus with hyperglycemia, without long-term current use of insulin  (HCC)      Plan:  Patient was evaluated and treated and all questions answered. -Discussed and educated patient on diabetic foot care, especially with  regards to the vascular, neurological and musculoskeletal systems.  -Stressed the importance of good glycemic control and the detriment of not  controlling glucose levels in relation to the foot. -Discussed supportive shoes at all times and checking feet  regularly.  -Mechanically debrided all nails 1-5 bilateral using sterile nail nipper and filed with dremel without incident  -Answered all patient questions -Patient to return  in 3 months for at risk foot care -Patient advised to call the office if any problems or questions arise in the meantime.   Asberry Failing, DPM

## 2023-10-04 NOTE — Telephone Encounter (Signed)
 Copied from CRM [id number]. Topic: Clinical - Request for Lab/Test Order >> Oct 04, 2023 11:45 AM Wyona SQUIBB wrote: Reason for CRM: Pt has upcoming 3 month follow up on 08/19 and would like to have labs for the follow up.   Once order is in pls reach out to pt to schedule pt

## 2023-10-04 NOTE — Telephone Encounter (Signed)
 Will discuss labs at apt. Nothing needed at this time. LS

## 2023-10-05 ENCOUNTER — Other Ambulatory Visit (HOSPITAL_BASED_OUTPATIENT_CLINIC_OR_DEPARTMENT_OTHER): Payer: Self-pay

## 2023-10-08 ENCOUNTER — Other Ambulatory Visit (HOSPITAL_BASED_OUTPATIENT_CLINIC_OR_DEPARTMENT_OTHER): Payer: Self-pay

## 2023-10-09 ENCOUNTER — Ambulatory Visit: Admitting: Family Medicine

## 2023-10-09 DIAGNOSIS — I781 Nevus, non-neoplastic: Secondary | ICD-10-CM | POA: Diagnosis not present

## 2023-10-09 DIAGNOSIS — H25013 Cortical age-related cataract, bilateral: Secondary | ICD-10-CM | POA: Diagnosis not present

## 2023-10-09 DIAGNOSIS — E083213 Diabetes mellitus due to underlying condition with mild nonproliferative diabetic retinopathy with macular edema, bilateral: Secondary | ICD-10-CM | POA: Diagnosis not present

## 2023-10-09 DIAGNOSIS — H35363 Drusen (degenerative) of macula, bilateral: Secondary | ICD-10-CM | POA: Diagnosis not present

## 2023-10-09 LAB — HM DIABETES EYE EXAM

## 2023-10-10 ENCOUNTER — Ambulatory Visit: Payer: Self-pay | Admitting: Family Medicine

## 2023-10-10 DIAGNOSIS — F4320 Adjustment disorder, unspecified: Secondary | ICD-10-CM | POA: Diagnosis not present

## 2023-10-12 ENCOUNTER — Telehealth: Payer: Self-pay

## 2023-10-12 ENCOUNTER — Ambulatory Visit: Admitting: Family Medicine

## 2023-10-12 ENCOUNTER — Other Ambulatory Visit (HOSPITAL_BASED_OUTPATIENT_CLINIC_OR_DEPARTMENT_OTHER): Payer: Self-pay

## 2023-10-12 ENCOUNTER — Ambulatory Visit: Payer: Self-pay | Admitting: Family Medicine

## 2023-10-12 ENCOUNTER — Other Ambulatory Visit: Payer: Self-pay

## 2023-10-12 ENCOUNTER — Other Ambulatory Visit (HOSPITAL_COMMUNITY): Payer: Self-pay

## 2023-10-12 ENCOUNTER — Encounter: Payer: Self-pay | Admitting: Family Medicine

## 2023-10-12 VITALS — BP 128/70 | HR 81 | Temp 97.7°F | Ht 74.0 in | Wt 247.0 lb

## 2023-10-12 DIAGNOSIS — Z7984 Long term (current) use of oral hypoglycemic drugs: Secondary | ICD-10-CM

## 2023-10-12 DIAGNOSIS — E1159 Type 2 diabetes mellitus with other circulatory complications: Secondary | ICD-10-CM

## 2023-10-12 DIAGNOSIS — E785 Hyperlipidemia, unspecified: Secondary | ICD-10-CM

## 2023-10-12 DIAGNOSIS — N183 Chronic kidney disease, stage 3 unspecified: Secondary | ICD-10-CM

## 2023-10-12 DIAGNOSIS — I152 Hypertension secondary to endocrine disorders: Secondary | ICD-10-CM

## 2023-10-12 DIAGNOSIS — E1169 Type 2 diabetes mellitus with other specified complication: Secondary | ICD-10-CM

## 2023-10-12 DIAGNOSIS — N521 Erectile dysfunction due to diseases classified elsewhere: Secondary | ICD-10-CM

## 2023-10-12 DIAGNOSIS — E1165 Type 2 diabetes mellitus with hyperglycemia: Secondary | ICD-10-CM | POA: Diagnosis not present

## 2023-10-12 DIAGNOSIS — E1122 Type 2 diabetes mellitus with diabetic chronic kidney disease: Secondary | ICD-10-CM | POA: Diagnosis not present

## 2023-10-12 DIAGNOSIS — R55 Syncope and collapse: Secondary | ICD-10-CM

## 2023-10-12 DIAGNOSIS — E113553 Type 2 diabetes mellitus with stable proliferative diabetic retinopathy, bilateral: Secondary | ICD-10-CM

## 2023-10-12 DIAGNOSIS — K219 Gastro-esophageal reflux disease without esophagitis: Secondary | ICD-10-CM

## 2023-10-12 LAB — BMP8+EGFR
BUN/Creatinine Ratio: 20 (ref 9–20)
BUN: 26 mg/dL — ABNORMAL HIGH (ref 6–24)
CO2: 19 mmol/L — ABNORMAL LOW (ref 20–29)
Calcium: 9 mg/dL (ref 8.7–10.2)
Chloride: 106 mmol/L (ref 96–106)
Creatinine, Ser: 1.33 mg/dL — ABNORMAL HIGH (ref 0.76–1.27)
Glucose: 84 mg/dL (ref 70–99)
Potassium: 4.6 mmol/L (ref 3.5–5.2)
Sodium: 142 mmol/L (ref 134–144)
eGFR: 63 mL/min/1.73 (ref >59)

## 2023-10-12 LAB — BAYER DCA HB A1C WAIVED: HB A1C (BAYER DCA - WAIVED): 5.1 % (ref 4.8–5.6)

## 2023-10-12 MED ORDER — FREESTYLE LIBRE 3 SENSOR MISC
1.0000 | 4 refills | Status: AC
  Filled 2023-10-12: qty 6, 84d supply, fill #0
  Filled 2024-01-16: qty 6, 84d supply, fill #1
  Filled 2024-04-04: qty 6, 84d supply, fill #2

## 2023-10-12 MED ORDER — ROSUVASTATIN CALCIUM 20 MG PO TABS: 20.0000 mg | ORAL_TABLET | Freq: Every day | ORAL | 3 refills | Status: AC

## 2023-10-12 MED ORDER — MOUNJARO 7.5 MG/0.5ML ~~LOC~~ SOAJ
7.5000 mg | SUBCUTANEOUS | 3 refills | Status: DC
  Filled 2023-10-12: qty 6, 84d supply, fill #0

## 2023-10-12 MED ORDER — DAPAGLIFLOZIN PROPANEDIOL 10 MG PO TABS
10.0000 mg | ORAL_TABLET | Freq: Every day | ORAL | 3 refills | Status: DC
  Filled 2023-10-12: qty 90, 90d supply, fill #0

## 2023-10-12 MED ORDER — FAMOTIDINE 20 MG PO TABS
20.0000 mg | ORAL_TABLET | Freq: Two times a day (BID) | ORAL | 3 refills | Status: AC
Start: 2023-10-12 — End: ?

## 2023-10-12 NOTE — Addendum Note (Signed)
 Addended by: SEVERA ROCK HERO on: 10/12/2023 09:40 AM   Modules accepted: Orders

## 2023-10-12 NOTE — Patient Instructions (Addendum)

## 2023-10-12 NOTE — Telephone Encounter (Signed)
 fyi

## 2023-10-12 NOTE — Telephone Encounter (Signed)
 The Freestyle Libre 14 day, 2 and 3 are being discontinued in September 2025. The new version is 2 plus and 3 plus and are available. We are highly encouraging providers to go ahead and make the switch now. That will prevent us  from doing the prior auth on the old version now and then having to do a prior auth on the new version again in a few months. Thanks!

## 2023-10-12 NOTE — Progress Notes (Signed)
 Subjective:  Patient ID: Zachary [name], male    DOB: 1965-08-29, 58 y.o.   MRN: [id number]  Patient Care Team: Severa Rock HERO, FNP as PCP - General (Family Medicine)   Chief Complaint:  Medical Management of Chronic Issues (Chronic check up )   HPI: Zachary [name] is a 58 y.o. male presenting on 10/12/2023 for Medical Management of Chronic Issues (Chronic check up )   Zachary [name] is a 58 year old male with erectile dysfunction and diabetes who presents with concerns about medication efficacy and eye health.  He is concerned about the efficacy of his current erectile dysfunction medication. He takes Cialis  20 mg every other day, which he finds ineffective. Previously, he tried Viagra  100 mg without success. He has not consulted a urologist for this issue, although he did see one for a kidney stone in the past.  Regarding his eye health, he is currently wearing glasses due to a rapid drop in blood sugar levels. His eye doctor has advised a follow-up in six months to monitor for any progression of retinopathy. He has not been referred to a retinal specialist at this time.  His blood sugar levels have been stable, with the highest reading being 116 over the past 14 days. No increased hunger, thirst, or urination. He is taking Farxiga  and Mounjaro . He avoids sugars and drinks only Coke Zero or [employer] with lemon.  No numbness or tingling in his legs or feet, no changes in vision, no chest pain, leg swelling, or shortness of breath. His blood pressure has been stable, and he has not experienced headaches.          Relevant past medical, surgical, family, and social history reviewed and updated as indicated.  Allergies and medications reviewed and updated. Data reviewed: Chart in Epic.   Past Medical History:  Diagnosis Date   ADHD    Asthma    Binge eating disorder    Diabetes mellitus    GERD (gastroesophageal reflux disease)    Gout    Hyperlipidemia     Hypertension    Insomnia    Kidney stones    Low magnesium level    Vitamin D deficiency     Past Surgical History:  Procedure Laterality Date   WISDOM TOOTH EXTRACTION     WRIST SURGERY Right     Social History   Socioeconomic History   Marital status: Married    Spouse name: Not on file   Number of children: Not on file   Years of education: Not on file   Highest education level: Bachelor's degree (e.g., BA, AB, BS)  Occupational History   Not on file  Tobacco Use   Smoking status: Never   Smokeless tobacco: Never  Vaping Use   Vaping status: Never Used  Substance and Sexual Activity   Alcohol use: Yes    Comment: occ   Drug use: No   Sexual activity: Not Currently  Other Topics Concern   Not on file  Social History Narrative   Lives with spouse and kids   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         Social Drivers of Health   Financial Resource Strain: Low Risk  (04/29/2023)   Overall Financial Resource Strain (CARDIA)    Difficulty of Paying Living Expenses: Not hard at all  Food Insecurity: No Food Insecurity (04/29/2023)   Hunger Vital Sign    Worried About  Running Out of Food in the Last Year: Never true    Ran Out of Food in the Last Year: Never true  Transportation Needs: No Transportation Needs (04/29/2023)   PRAPARE - Administrator, Civil Service (Medical): No    Lack of Transportation (Non-Medical): No  Physical Activity: Insufficiently Active (04/29/2023)   Exercise Vital Sign    Days of Exercise per Week: 3 days    Minutes of Exercise per Session: 40 min  Stress: Stress Concern Present (04/29/2023)   [employer] of Occupational Health - Occupational Stress Questionnaire    Feeling of Stress : To some extent  Social Connections: Socially Integrated (04/29/2023)   Social Connection and Isolation Panel    Frequency of Communication with Friends and Family: More than three times a week    Frequency of Social  Gatherings with Friends and Family: Three times a week    Attends Religious Services: More than 4 times per year    Active Member of Clubs or Organizations: Yes    Attends Banker Meetings: More than 4 times per year    Marital Status: Married  Catering manager Violence: Not At Risk (01/31/2022)   Received from [employer] [facility]   HITS    Over the last 12 months how often did your partner physically hurt you?: Never    Over the last 12 months how often did your partner insult you or talk down to you?: Never    Over the last 12 months how often did your partner threaten you with physical harm?: Never    Over the last 12 months how often did your partner scream or curse at you?: Never    Outpatient Encounter Medications as of 10/12/2023  Medication Sig   amLODipine  (NORVASC ) 5 MG tablet Take 1 tablet (5 mg total) by mouth daily.   aspirin 81 MG tablet Take 81 mg by mouth daily.    buPROPion  (WELLBUTRIN  XL) 300 MG 24 hr tablet Take 1 tablet (300 mg total) by mouth daily.   Continuous Glucose Receiver (FREESTYLE LIBRE 3 READER) DEVI 1 each by Does not apply route in the morning, at noon, in the evening, and at bedtime.   Continuous Glucose Sensor (FREESTYLE LIBRE 3 SENSOR) MISC Place 1 sensor on the skin every 14 days. Use to check glucose continuously   Continuous Glucose Sensor (FREESTYLE LIBRE 3 SENSOR) MISC Place 1 sensor onto the skin every 14 days. Use to check glucose continuously   dapagliflozin  propanediol (FARXIGA ) 10 MG TABS tablet Take 1 tablet (10 mg total) by mouth daily.   famotidine  (PEPCID ) 20 MG tablet Take 1 tablet (20 mg total) by mouth 2 (two) times daily.   fluticasone  (FLONASE ) 50 MCG/ACT nasal spray Place 2 sprays into both nostrils daily.   ondansetron  (ZOFRAN -ODT) 4 MG disintegrating tablet Take 1 tablet (4 mg total) by mouth every 8 (eight) hours as needed for nausea or vomiting.   rosuvastatin  (CRESTOR ) 20 MG tablet Take 1 tablet (20 mg total) by mouth daily.    tadalafil  (CIALIS ) 20 MG tablet Take 1 tablet (20 mg total) by mouth every other day as needed for erectile dysfunction.   tirzepatide  (MOUNJARO ) 7.5 MG/0.5ML Pen Inject 7.5 mg into the skin once a week.   valsartan  (DIOVAN ) 320 MG tablet Take 1 tablet (320 mg total) by mouth daily.   [DISCONTINUED] dapagliflozin  propanediol (FARXIGA ) 10 MG TABS tablet Take 1 tablet (10 mg total) by mouth daily.   [DISCONTINUED] famotidine  (PEPCID ) 20  MG tablet Take 1 tablet (20 mg total) by mouth 2 (two) times daily.   [DISCONTINUED] rosuvastatin  (CRESTOR ) 20 MG tablet Take 1 tablet (20 mg total) by mouth daily.   [DISCONTINUED] tirzepatide  (MOUNJARO ) 7.5 MG/0.5ML Pen Inject 7.5 mg into the skin once a week.   No facility-administered encounter medications on file as of 10/12/2023.    Allergies  Allergen Reactions   Ace Inhibitors     REACTION: cough   Sulfa Antibiotics Other (See Comments)    unk   Sulfamethoxazole-Trimethoprim     REACTION: unspecified    Pertinent ROS per HPI, otherwise unremarkable      Objective:  BP 128/70   Pulse 81   Temp 97.7 F (36.5 C)   Ht 6' 2 (1.88 m)   Wt 247 lb (112 kg)   SpO2 98%   BMI 31.71 kg/m    Wt Readings from Last 3 Encounters:  10/12/23 247 lb (112 kg)  08/08/23 234 lb 12.8 oz (106.5 kg)  08/01/23 229 lb 6.4 oz (104.1 kg)    Physical Exam Vitals and nursing note reviewed.  Constitutional:      Appearance: Normal appearance. He is obese.  HENT:     Head: Normocephalic and atraumatic.     Nose: Nose normal.     Mouth/Throat:     Mouth: Mucous membranes are moist.  Eyes:     Pupils: Pupils are equal, round, and reactive to light.  Cardiovascular:     Rate and Rhythm: Normal rate and regular rhythm.     Heart sounds: Normal heart sounds.  Pulmonary:     Effort: Pulmonary effort is normal.     Breath sounds: Normal breath sounds.  Musculoskeletal:     Cervical back: Neck supple.     Right lower leg: No edema.     Left lower leg: No  edema.  Skin:    General: Skin is warm and dry.     Capillary Refill: Capillary refill takes less than 2 seconds.  Neurological:     General: No focal deficit present.     Mental Status: He is alert and oriented to person, place, and time.  Psychiatric:        Mood and Affect: Mood normal.        Behavior: Behavior normal.        Thought Content: Thought content normal.        Judgment: Judgment normal.      Results for orders placed or performed in visit on 10/10/23  HM DIABETES EYE EXAM   Collection Time: 10/09/23  9:31 AM  Result Value Ref Range   HM Diabetic Eye Exam Retinopathy (A) No Retinopathy       Pertinent labs & imaging results that were available during my care of the patient were reviewed by me and considered in my medical decision making.  Assessment & Plan:  Zachary [name] was seen today for medical management of chronic issues.  Diagnoses and all orders for this visit:  Type 2 diabetes mellitus with hyperglycemia, without long-term current use of insulin  (HCC) -     Bayer DCA Hb A1c Waived -     tirzepatide  (MOUNJARO ) 7.5 MG/0.5ML Pen; Inject 7.5 mg into the skin once a week. -     dapagliflozin  propanediol (FARXIGA ) 10 MG TABS tablet; Take 1 tablet (10 mg total) by mouth daily. -     BMP8+EGFR  Hyperlipidemia associated with type 2 diabetes mellitus (HCC) -     Bayer  DCA Hb A1c Waived -     rosuvastatin  (CRESTOR ) 20 MG tablet; Take 1 tablet (20 mg total) by mouth daily. -     BMP8+EGFR  Hypertension associated with diabetes (HCC) -     Bayer DCA Hb A1c Waived -     BMP8+EGFR  CKD stage 3 due to type 2 diabetes mellitus (HCC) -     Bayer DCA Hb A1c Waived -     BMP8+EGFR  Gastroesophageal reflux disease without esophagitis -     famotidine  (PEPCID ) 20 MG tablet; Take 1 tablet (20 mg total) by mouth 2 (two) times daily.  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 Referral to a urologist is recommended for further evaluation and management. - Refer to Dr. Sherrilee, a urologist in [place], for further evaluation and management.  Diabetes Mellitus Blood sugar levels are well-managed with Farxiga  and Mounjaro , which are effective in managing kidney function and A1c levels. Increased appetite noted; potential increase in Mounjaro  dosage will be considered after reviewing A1c results. - Continue Farxiga  and Mounjaro . - Review A1c results to determine if an increase in Mounjaro  dosage is necessary.  Diabetic Retinopathy Vision changes attributed to a rapid drop in blood sugar levels. Currently under the care of an eye specialist with a follow-up scheduled in six months. If retinopathy worsens, a referral to a retinopathy specialist in [place] will be considered. - Continue follow-up with the eye specialist in six months. - Consider referral to a retinopathy specialist in [facility] if retinopathy worsens.  Chronic Kidney Disease Creatinine level is well-managed at 1.29, confirmed by a nephrologist. Current management is advised as kidney function is stable. - Continue current management and monitoring of kidney function.  Hypertension Blood pressure is well-controlled. Consideration to discontinue amlodipine  in three months if blood pressure remains stable. - Monitor blood pressure and consider discontinuing amlodipine  in three months if stable.          Continue all other maintenance medications.  Follow up plan: Return in about 3 months (around 01/12/2024), or if symptoms worsen or fail to improve, for DM.   Continue healthy lifestyle choices, including diet (rich in fruits, vegetables, and lean proteins, and low in salt and simple carbohydrates) and exercise (at least 30  minutes of moderate physical activity daily).  Educational handout given for DM  The above assessment and management plan was discussed with the patient. The patient verbalized understanding of and has agreed to the management plan. Patient is aware to call the clinic if they develop any new symptoms or if symptoms persist or worsen. Patient is aware when to return to the clinic for a follow-up visit. Patient educated on when it is appropriate to go to the emergency department.   Rosaline Bruns, FNP-C [facility] [phone]

## 2023-10-15 DIAGNOSIS — F4320 Adjustment disorder, unspecified: Secondary | ICD-10-CM | POA: Diagnosis not present

## 2023-10-16 ENCOUNTER — Encounter: Payer: Self-pay | Admitting: Family Medicine

## 2023-10-22 DIAGNOSIS — F4320 Adjustment disorder, unspecified: Secondary | ICD-10-CM | POA: Diagnosis not present

## 2023-11-02 DIAGNOSIS — F4320 Adjustment disorder, unspecified: Secondary | ICD-10-CM | POA: Diagnosis not present

## 2023-11-09 ENCOUNTER — Encounter: Payer: Self-pay | Admitting: Family Medicine

## 2023-11-09 ENCOUNTER — Other Ambulatory Visit

## 2023-11-09 DIAGNOSIS — F4320 Adjustment disorder, unspecified: Secondary | ICD-10-CM | POA: Diagnosis not present

## 2023-11-13 ENCOUNTER — Ambulatory Visit: Admitting: Family Medicine

## 2023-11-15 DIAGNOSIS — F4320 Adjustment disorder, unspecified: Secondary | ICD-10-CM | POA: Diagnosis not present

## 2023-11-21 ENCOUNTER — Encounter: Payer: Self-pay | Admitting: Urology

## 2023-11-21 ENCOUNTER — Ambulatory Visit: Admitting: Urology

## 2023-11-21 VITALS — BP 119/86 | HR 102

## 2023-11-21 DIAGNOSIS — N529 Male erectile dysfunction, unspecified: Secondary | ICD-10-CM

## 2023-11-21 MED ORDER — AMBULATORY NON FORMULARY MEDICATION: 0.2000 mL | 5 refills | Status: AC | PRN

## 2023-11-21 MED ORDER — AMBULATORY NON FORMULARY MEDICATION
0.2000 mL | 5 refills | Status: DC | PRN
Start: 2023-11-21 — End: 2023-11-21

## 2023-11-21 NOTE — Progress Notes (Signed)
 11/21/2023 3:11 PM   Zachary [name] Daughters May 22, 1965 [id number]  Referring provider: Severa Rock HERO, FNP [address] [place],  [place] [address]  Erectile dysfunction   HPI: [name] is a 58yo here for evaluation of erectile dysfunction. He has previously tried sildenafil  100mg  and tadalafil  20mg  with poor results. He has had DMII for over 10 years and originally it was poorly controlled and now it is under good control. Last A1c was 4.5. Libido is good.    PMH: Past Medical History:  Diagnosis Date   ADHD    Asthma    Binge eating disorder    Diabetes mellitus    GERD (gastroesophageal reflux disease)    Gout    Hyperlipidemia    Hypertension    Insomnia    Kidney stones    Low magnesium level    Vitamin D deficiency     Surgical History: Past Surgical History:  Procedure Laterality Date   WISDOM TOOTH EXTRACTION     WRIST SURGERY Right     Home Medications:  Allergies as of 11/21/2023       Reactions   Ace Inhibitors    REACTION: cough   Sulfa Antibiotics Other (See Comments)   unk   Sulfamethoxazole-trimethoprim    REACTION: unspecified        Medication List        Accurate as of November 21, 2023  3:11 PM. If you have any questions, ask your nurse or doctor.          amLODipine  5 MG tablet Commonly known as: NORVASC  Take 1 tablet (5 mg total) by mouth daily.   aspirin 81 MG tablet Take 81 mg by mouth daily.   buPROPion  300 MG 24 hr tablet Commonly known as: Wellbutrin  XL Take 1 tablet (300 mg total) by mouth daily.   famotidine  20 MG tablet Commonly known as: Pepcid  Take 1 tablet (20 mg total) by mouth 2 (two) times daily.   Farxiga  10 MG Tabs tablet Generic drug: dapagliflozin  propanediol Take 1 tablet (10 mg total) by mouth daily.   fluticasone  50 MCG/ACT nasal spray Commonly known as: FLONASE  Place 2 sprays into both nostrils daily.   FreeStyle Libre 3 Reader Devi 1 each by Does not apply route in the morning, at noon, in the  evening, and at bedtime.   FreeStyle Libre 3 Sensor Misc Place 1 sensor onto the skin every 14 days. Use to check glucose continuously   FreeStyle Libre 3 Sensor Misc Place 1 sensor on the skin every 14 days. Use to check glucose continuously   Mounjaro  7.5 MG/0.5ML Pen Generic drug: tirzepatide  Inject 7.5 mg into the skin once a week.   ondansetron  4 MG disintegrating tablet Commonly known as: ZOFRAN -ODT Take 1 tablet (4 mg total) by mouth every 8 (eight) hours as needed for nausea or vomiting.   rosuvastatin  20 MG tablet Commonly known as: CRESTOR  Take 1 tablet (20 mg total) by mouth daily.   tadalafil  20 MG tablet Commonly known as: CIALIS  Take 1 tablet (20 mg total) by mouth every other day as needed for erectile dysfunction.   valsartan  320 MG tablet Commonly known as: DIOVAN  Take 1 tablet (320 mg total) by mouth daily.        Allergies:  Allergies  Allergen Reactions   Ace Inhibitors     REACTION: cough   Sulfa Antibiotics Other (See Comments)    unk   Sulfamethoxazole-Trimethoprim     REACTION: unspecified  Family History: Family History  Problem Relation Age of Onset   Breast cancer Mother    Hypertension Mother    Pneumonia Mother    Crohn's disease Mother    Leukemia Father    GER disease Father    Macular degeneration Father    Alcohol abuse Brother    ADD / ADHD Son    Stroke Maternal Grandmother    Stroke Paternal Grandmother     Social History:  reports that he has never smoked. He has never used smokeless tobacco. He reports current alcohol use. He reports that he does not use drugs.  ROS: All other review of systems were reviewed and are negative except what is noted above in HPI  Physical Exam: BP 119/86   Pulse (!) 102   Constitutional:  Alert and oriented, No acute distress. HEENT: [place] AT, moist mucus membranes.  Trachea midline, no masses. Cardiovascular: No clubbing, cyanosis, or edema. Respiratory: Normal respiratory effort,  no increased work of breathing. GI: Abdomen is soft, nontender, nondistended, no abdominal masses GU: No CVA tenderness.  Lymph: No cervical or inguinal lymphadenopathy. Skin: No rashes, bruises or suspicious lesions. Neurologic: Grossly intact, no focal deficits, moving all 4 extremities. Psychiatric: Normal mood and affect.  Laboratory Data: Lab Results  Component Value Date   WBC 6.6 04/19/2023   HGB 17.0 04/19/2023   HCT 49.8 04/19/2023   MCV 94 04/19/2023   PLT 210 04/19/2023    Lab Results  Component Value Date   CREATININE 1.33 (H) 10/12/2023    Lab Results  Component Value Date   PSA 0.55 05/22/2018   PSA 0.26 08/06/2008    No results found for: TESTOSTERONE  Lab Results  Component Value Date   HGBA1C 5.1 10/12/2023    Urinalysis    Component Value Date/Time   COLORURINE LT. YELLOW 08/06/2008 0733   APPEARANCEUR CLEAR 08/06/2008 0733   LABSPEC 1.020 08/06/2008 0733   PHURINE 6.0 08/06/2008 0733   GLUCOSEU NEGATIVE 08/06/2008 0733   BILIRUBINUR N 09/04/2016 1226   KETONESUR NEGATIVE 08/06/2008 0733   PROTEINUR 1+ 09/04/2016 1226   UROBILINOGEN 0.2 09/04/2016 1226   UROBILINOGEN 0.2 08/06/2008 0733   NITRITE N 09/04/2016 1226   NITRITE NEGATIVE 08/06/2008 0733   LEUKOCYTESUR Negative 09/04/2016 1226    Lab Results  Component Value Date   LABMICR 154.3 04/19/2023    Pertinent Imaging:  No results found for this or any previous visit.  No results found for this or any previous visit.  No results found for this or any previous visit.  No results found for this or any previous visit.  Results for orders placed during the hospital encounter of 07/02/23  US  RENAL  Narrative CLINICAL DATA:  CKD STAGE 3A, GFR 45-59 ML/MIN  EXAM: RENAL / URINARY TRACT ULTRASOUND COMPLETE  COMPARISON:  None Available.  FINDINGS: Right Kidney:  Renal measurements: 12.8 x 5.9 x 5.6 cm = volume: 221 mL. Echogenicity within normal limits. No mass or  hydronephrosis visualized.  Left Kidney:  Renal measurements: 8.1 x 4.5 x 4.5 cm = volume: 87 mL. Echogenicity within normal limits. No mass or hydronephrosis visualized.  Bladder:  Appears normal for degree of bladder distention. Both ureteral jets visualized.  Other:  None.  IMPRESSION: 1.  No hydronephrosis or nephrolithiasis. 2. Mildly atrophic left kidney.   Electronically Signed By: Rogelia Myers M.D. On: 07/02/2023 15:40  No results found for this or any previous visit.  No results found for this or any  previous visit.  No results found for this or any previous visit.   Assessment & Plan:    1. Erectile dysfunction, unspecified erectile dysfunction type (Primary) -We discussed VED, ICI, and MUSe. After discussing the options the patient elects for trimix therapy.   No follow-ups on file.  Belvie Clara, MD  [facility] [place]

## 2023-11-21 NOTE — Addendum Note (Signed)
 Addended by: SAMMIE EXIE HERO on: 11/21/2023 03:33 PM   Modules accepted: Orders

## 2023-11-23 DIAGNOSIS — F4320 Adjustment disorder, unspecified: Secondary | ICD-10-CM | POA: Diagnosis not present

## 2023-11-30 DIAGNOSIS — F4320 Adjustment disorder, unspecified: Secondary | ICD-10-CM | POA: Diagnosis not present

## 2023-12-04 DIAGNOSIS — F4320 Adjustment disorder, unspecified: Secondary | ICD-10-CM | POA: Diagnosis not present

## 2023-12-14 DIAGNOSIS — F4320 Adjustment disorder, unspecified: Secondary | ICD-10-CM | POA: Diagnosis not present

## 2023-12-17 DIAGNOSIS — F4320 Adjustment disorder, unspecified: Secondary | ICD-10-CM | POA: Diagnosis not present

## 2023-12-24 DIAGNOSIS — N1831 Chronic kidney disease, stage 3a: Secondary | ICD-10-CM | POA: Diagnosis not present

## 2023-12-24 DIAGNOSIS — E1122 Type 2 diabetes mellitus with diabetic chronic kidney disease: Secondary | ICD-10-CM | POA: Diagnosis not present

## 2023-12-24 DIAGNOSIS — E559 Vitamin D deficiency, unspecified: Secondary | ICD-10-CM | POA: Diagnosis not present

## 2023-12-24 DIAGNOSIS — I129 Hypertensive chronic kidney disease with stage 1 through stage 4 chronic kidney disease, or unspecified chronic kidney disease: Secondary | ICD-10-CM | POA: Diagnosis not present

## 2023-12-27 ENCOUNTER — Encounter: Payer: Self-pay | Admitting: Family Medicine

## 2023-12-27 ENCOUNTER — Other Ambulatory Visit (HOSPITAL_BASED_OUTPATIENT_CLINIC_OR_DEPARTMENT_OTHER): Payer: Self-pay

## 2023-12-27 ENCOUNTER — Ambulatory Visit: Admitting: Family Medicine

## 2023-12-27 VITALS — BP 129/91 | HR 89 | Temp 97.6°F | Ht 74.0 in | Wt 247.0 lb

## 2023-12-27 DIAGNOSIS — Z7985 Long-term (current) use of injectable non-insulin antidiabetic drugs: Secondary | ICD-10-CM | POA: Diagnosis not present

## 2023-12-27 DIAGNOSIS — S39012A Strain of muscle, fascia and tendon of lower back, initial encounter: Secondary | ICD-10-CM | POA: Diagnosis not present

## 2023-12-27 DIAGNOSIS — E1165 Type 2 diabetes mellitus with hyperglycemia: Secondary | ICD-10-CM | POA: Diagnosis not present

## 2023-12-27 MED ORDER — TIRZEPATIDE 10 MG/0.5ML ~~LOC~~ SOAJ
10.0000 mg | SUBCUTANEOUS | 2 refills | Status: AC
  Filled 2023-12-27: qty 6, 84d supply, fill #0
  Filled 2024-04-04: qty 6, 84d supply, fill #1

## 2023-12-27 MED ORDER — CYCLOBENZAPRINE HCL 10 MG PO TABS
10.0000 mg | ORAL_TABLET | Freq: Three times a day (TID) | ORAL | 0 refills | Status: AC | PRN
  Filled 2023-12-27 (×2): qty 30, 10d supply, fill #0

## 2023-12-27 MED ORDER — METHYLPREDNISOLONE ACETATE 80 MG/ML IJ SUSP
80.0000 mg | Freq: Once | INTRAMUSCULAR | Status: AC
  Administered 2023-12-27: 40 mg via INTRAMUSCULAR

## 2023-12-27 NOTE — Progress Notes (Signed)
 Subjective:  Patient ID: Zachary [name], male    DOB: June 10, 1965, 58 y.o.   MRN: [id number]  Patient Care Team: Severa Rock HERO, FNP as PCP - General (Family Medicine)   Chief Complaint:  Back Pain (Started hurting yesterday after playing golf. )   HPI: Zachary [name] is a 58 y.o. male presenting on 12/27/2023 for Back Pain (Started hurting yesterday after playing golf. )    Zachary [name] is a 58 year old male who presents with muscle spasms across the thoracic back area.  He experiences significant muscle spasms across the thoracic back area, specifically on the left side. He manages the pain with Voltaren  and heat application, which provides some relief. He has previously used Flexeril  as a muscle relaxer, which took a few days to work. He has also tried massage therapy in the past.  He mentions a recent weight gain of approximately twenty pounds and wants to start losing weight again.          Relevant past medical, surgical, family, and social history reviewed and updated as indicated.  Allergies and medications reviewed and updated. Data reviewed: Chart in Epic.   Past Medical History:  Diagnosis Date   ADHD    Asthma    Binge eating disorder    Diabetes mellitus    GERD (gastroesophageal reflux disease)    Gout    Hyperlipidemia    Hypertension    Insomnia    Kidney stones    Low magnesium level    Vitamin D deficiency     Past Surgical History:  Procedure Laterality Date   WISDOM TOOTH EXTRACTION     WRIST SURGERY Right     Social History   Socioeconomic History   Marital status: Married    Spouse name: Not on file   Number of children: Not on file   Years of education: Not on file   Highest education level: Bachelor's degree (e.g., BA, AB, BS)  Occupational History   Not on file  Tobacco Use   Smoking status: Never   Smokeless tobacco: Never  Vaping Use   Vaping status: Never Used  Substance and Sexual Activity   Alcohol use: Yes     Comment: occ   Drug use: No   Sexual activity: Not Currently  Other Topics Concern   Not on file  Social History Narrative   Lives with spouse and kids   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         Social Drivers of Health   Financial Resource Strain: Low Risk  (04/29/2023)   Overall Financial Resource Strain (CARDIA)    Difficulty of Paying Living Expenses: Not hard at all  Food Insecurity: No Food Insecurity (04/29/2023)   Hunger Vital Sign    Worried About Running Out of Food in the Last Year: Never true    Ran Out of Food in the Last Year: Never true  Transportation Needs: No Transportation Needs (04/29/2023)   PRAPARE - Administrator, Civil Service (Medical): No    Lack of Transportation (Non-Medical): No  Physical Activity: Insufficiently Active (04/29/2023)   Exercise Vital Sign    Days of Exercise per Week: 3 days    Minutes of Exercise per Session: 40 min  Stress: Stress Concern Present (04/29/2023)   [employer] of Occupational Health - Occupational Stress Questionnaire    Feeling of Stress : To some extent  Social Connections: Socially  Integrated (04/29/2023)   Social Connection and Isolation Panel    Frequency of Communication with Friends and Family: More than three times a week    Frequency of Social Gatherings with Friends and Family: Three times a week    Attends Religious Services: More than 4 times per year    Active Member of Clubs or Organizations: Yes    Attends Banker Meetings: More than 4 times per year    Marital Status: Married  Catering manager Violence: Not At Risk (01/31/2022)   Received from [facility]   HITS    Over the last 12 months how often did your partner physically hurt you?: Never    Over the last 12 months how often did your partner insult you or talk down to you?: Never    Over the last 12 months how often did your partner threaten you with physical harm?: Never    Over the last 12  months how often did your partner scream or curse at you?: Never    Outpatient Encounter Medications as of 12/27/2023  Medication Sig   AMBULATORY NON FORMULARY MEDICATION 0.2 mLs by Intracavernosal route as needed. Medication Name: Trimix  PGE 30mcg Pap 30mg  Phent 1mg    amLODipine  (NORVASC ) 5 MG tablet Take 1 tablet (5 mg total) by mouth daily.   aspirin 81 MG tablet Take 81 mg by mouth daily.    buPROPion  (WELLBUTRIN  XL) 300 MG 24 hr tablet Take 1 tablet (300 mg total) by mouth daily.   Continuous Glucose Receiver (FREESTYLE LIBRE 3 READER) DEVI 1 each by Does not apply route in the morning, at noon, in the evening, and at bedtime.   Continuous Glucose Sensor (FREESTYLE LIBRE 3 SENSOR) MISC Place 1 sensor onto the skin every 14 days. Use to check glucose continuously   Continuous Glucose Sensor (FREESTYLE LIBRE 3 SENSOR) MISC Place 1 sensor on the skin every 14 days. Use to check glucose continuously   cyclobenzaprine  (FLEXERIL ) 10 MG tablet Take 1 tablet (10 mg total) by mouth 3 (three) times daily as needed for muscle spasms.   dapagliflozin  propanediol (FARXIGA ) 10 MG TABS tablet Take 1 tablet (10 mg total) by mouth daily.   famotidine  (PEPCID ) 20 MG tablet Take 1 tablet (20 mg total) by mouth 2 (two) times daily.   fluticasone  (FLONASE ) 50 MCG/ACT nasal spray Place 2 sprays into both nostrils daily.   ondansetron  (ZOFRAN -ODT) 4 MG disintegrating tablet Take 1 tablet (4 mg total) by mouth every 8 (eight) hours as needed for nausea or vomiting.   rosuvastatin  (CRESTOR ) 20 MG tablet Take 1 tablet (20 mg total) by mouth daily.   tadalafil  (CIALIS ) 20 MG tablet Take 1 tablet (20 mg total) by mouth every other day as needed for erectile dysfunction.   tirzepatide  (MOUNJARO ) 10 MG/0.5ML Pen Inject 10 mg into the skin once a week.   valsartan  (DIOVAN ) 320 MG tablet Take 1 tablet (320 mg total) by mouth daily.   [DISCONTINUED] tirzepatide  (MOUNJARO ) 7.5 MG/0.5ML Pen Inject 7.5 mg into the skin  once a week.   [EXPIRED] methylPREDNISolone  acetate (DEPO-MEDROL ) injection 80 mg    No facility-administered encounter medications on file as of 12/27/2023.    Allergies  Allergen Reactions   Ace Inhibitors     REACTION: cough   Sulfa Antibiotics Other (See Comments)    unk   Sulfamethoxazole-Trimethoprim     REACTION: unspecified    Pertinent ROS per HPI, otherwise unremarkable      Objective:  BP (!) 129/91   Pulse 89   Temp 97.6 F (36.4 C)   Ht 6' 2 (1.88 m)   Wt 247 lb (112 kg)   SpO2 96%   BMI 31.71 kg/m    Wt Readings from Last 3 Encounters:  12/27/23 247 lb (112 kg)  10/12/23 247 lb (112 kg)  08/08/23 234 lb 12.8 oz (106.5 kg)    Physical Exam Vitals and nursing note reviewed.  Constitutional:      General: He is not in acute distress.    Appearance: Normal appearance. He is obese. He is not ill-appearing, toxic-appearing or diaphoretic.  HENT:     Head: Normocephalic and atraumatic.     Nose: Nose normal.     Mouth/Throat:     Mouth: Mucous membranes are moist.  Eyes:     Pupils: Pupils are equal, round, and reactive to light.  Cardiovascular:     Rate and Rhythm: Normal rate.  Pulmonary:     Effort: Pulmonary effort is normal.  Musculoskeletal:     Cervical back: Normal and neck supple.     Thoracic back: Spasms and tenderness present. No swelling, edema, deformity, signs of trauma, lacerations or bony tenderness. Normal range of motion. No scoliosis.     Lumbar back: Normal.       Back:     Right lower leg: No edema.     Left lower leg: No edema.  Skin:    General: Skin is warm and dry.     Capillary Refill: Capillary refill takes less than 2 seconds.  Neurological:     General: No focal deficit present.     Mental Status: He is alert and oriented to person, place, and time.  Psychiatric:        Mood and Affect: Mood normal.        Behavior: Behavior normal.        Thought Content: Thought content normal.        Judgment: Judgment  normal.       Results for orders placed or performed in visit on 10/12/23  Bayer DCA Hb A1c Waived   Collection Time: 10/12/23  8:22 AM  Result Value Ref Range   HB A1C (BAYER DCA - WAIVED) 5.1 4.8 - 5.6 %  BMP8+EGFR   Collection Time: 10/12/23  8:30 AM  Result Value Ref Range   Glucose 84 70 - 99 mg/dL   BUN 26 (H) 6 - 24 mg/dL   Creatinine, Ser 8.66 (H) 0.76 - 1.27 mg/dL   eGFR 63 >40 fO/fpw/8.26   BUN/Creatinine Ratio 20 9 - 20   Sodium 142 134 - 144 mmol/L   Potassium 4.6 3.5 - 5.2 mmol/L   Chloride 106 96 - 106 mmol/L   CO2 19 (L) 20 - 29 mmol/L   Calcium  9.0 8.7 - 10.2 mg/dL       Pertinent labs & imaging results that were available during my care of the patient were reviewed by me and considered in my medical decision making.  Assessment & Plan:  Zachary [name] was seen today for back pain.  Diagnoses and all orders for this visit:  Back strain, initial encounter -     cyclobenzaprine  (FLEXERIL ) 10 MG tablet; Take 1 tablet (10 mg total) by mouth 3 (three) times daily as needed for muscle spasms. -     methylPREDNISolone  acetate (DEPO-MEDROL ) injection 80 mg  Type 2 diabetes mellitus with hyperglycemia, without long-term current use of insulin  (HCC) -  tirzepatide  (MOUNJARO ) 10 MG/0.5ML Pen; Inject 10 mg into the skin once a week.        Lower back muscle strain Chronic lower back muscle strain with significant spasm across the bra line. Previous treatment with Voltaren  and heat. Avoidance of oral steroids due to potential hyperglycemia. - Administered steroid injection today - Prescribed muscle relaxers - Advised use of heat and Biofreeze - Instructed to report if symptoms worsen  Obesity Recent weight gain of approximately 20 pounds. Current treatment with Mounjaro , with consideration for dosage increase to aid in weight loss. - Increased Mounjaro  dosage to 10 mg    Continue all other maintenance medications.  Follow up plan: Return if symptoms  worsen or fail to improve.   Continue healthy lifestyle choices, including diet (rich in fruits, vegetables, and lean proteins, and low in salt and simple carbohydrates) and exercise (at least 30 minutes of moderate physical activity daily).  Educational handout given for low back sprain  The above assessment and management plan was discussed with the patient. The patient verbalized understanding of and has agreed to the management plan. Patient is aware to call the clinic if they develop any new symptoms or if symptoms persist or worsen. Patient is aware when to return to the clinic for a follow-up visit. Patient educated on when it is appropriate to go to the emergency department.   Rosaline Bruns, FNP-C [facility] [phone]

## 2023-12-28 DIAGNOSIS — F4320 Adjustment disorder, unspecified: Secondary | ICD-10-CM | POA: Diagnosis not present

## 2023-12-31 ENCOUNTER — Encounter: Payer: Self-pay | Admitting: Family Medicine

## 2024-01-03 ENCOUNTER — Encounter: Payer: Self-pay | Admitting: Orthopaedic Surgery

## 2024-01-04 ENCOUNTER — Ambulatory Visit: Admitting: Podiatry

## 2024-01-04 DIAGNOSIS — F4320 Adjustment disorder, unspecified: Secondary | ICD-10-CM | POA: Diagnosis not present

## 2024-01-04 DIAGNOSIS — Z91199 Patient's noncompliance with other medical treatment and regimen due to unspecified reason: Secondary | ICD-10-CM

## 2024-01-04 NOTE — Progress Notes (Signed)
 No show

## 2024-01-11 DIAGNOSIS — F4320 Adjustment disorder, unspecified: Secondary | ICD-10-CM | POA: Diagnosis not present

## 2024-01-15 DIAGNOSIS — F4320 Adjustment disorder, unspecified: Secondary | ICD-10-CM | POA: Diagnosis not present

## 2024-01-16 ENCOUNTER — Encounter: Payer: Self-pay | Admitting: Podiatry

## 2024-01-16 ENCOUNTER — Ambulatory Visit: Admitting: Family Medicine

## 2024-01-16 ENCOUNTER — Ambulatory Visit: Admitting: Urology

## 2024-01-16 ENCOUNTER — Other Ambulatory Visit (HOSPITAL_BASED_OUTPATIENT_CLINIC_OR_DEPARTMENT_OTHER): Payer: Self-pay

## 2024-01-16 ENCOUNTER — Encounter: Payer: Self-pay | Admitting: Family Medicine

## 2024-01-16 VITALS — BP 128/80 | HR 86 | Temp 97.7°F | Ht 74.0 in | Wt 250.0 lb

## 2024-01-16 DIAGNOSIS — F900 Attention-deficit hyperactivity disorder, predominantly inattentive type: Secondary | ICD-10-CM

## 2024-01-16 DIAGNOSIS — I152 Hypertension secondary to endocrine disorders: Secondary | ICD-10-CM

## 2024-01-16 DIAGNOSIS — E1169 Type 2 diabetes mellitus with other specified complication: Secondary | ICD-10-CM | POA: Diagnosis not present

## 2024-01-16 DIAGNOSIS — Z23 Encounter for immunization: Secondary | ICD-10-CM

## 2024-01-16 DIAGNOSIS — E1122 Type 2 diabetes mellitus with diabetic chronic kidney disease: Secondary | ICD-10-CM

## 2024-01-16 DIAGNOSIS — E1159 Type 2 diabetes mellitus with other circulatory complications: Secondary | ICD-10-CM | POA: Diagnosis not present

## 2024-01-16 DIAGNOSIS — E113553 Type 2 diabetes mellitus with stable proliferative diabetic retinopathy, bilateral: Secondary | ICD-10-CM

## 2024-01-16 DIAGNOSIS — F339 Major depressive disorder, recurrent, unspecified: Secondary | ICD-10-CM | POA: Diagnosis not present

## 2024-01-16 DIAGNOSIS — Z7984 Long term (current) use of oral hypoglycemic drugs: Secondary | ICD-10-CM

## 2024-01-16 DIAGNOSIS — N183 Chronic kidney disease, stage 3 unspecified: Secondary | ICD-10-CM

## 2024-01-16 DIAGNOSIS — E1165 Type 2 diabetes mellitus with hyperglycemia: Secondary | ICD-10-CM

## 2024-01-16 DIAGNOSIS — N529 Male erectile dysfunction, unspecified: Secondary | ICD-10-CM

## 2024-01-16 DIAGNOSIS — E785 Hyperlipidemia, unspecified: Secondary | ICD-10-CM

## 2024-01-16 LAB — BAYER DCA HB A1C WAIVED: HB A1C (BAYER DCA - WAIVED): 5.1 % (ref 4.8–5.6)

## 2024-01-16 MED ORDER — BUPROPION HCL ER (XL) 300 MG PO TB24: 300.0000 mg | ORAL_TABLET | Freq: Every day | ORAL | 3 refills | Status: AC

## 2024-01-16 MED ORDER — TADALAFIL 20 MG PO TABS
20.0000 mg | ORAL_TABLET | ORAL | 3 refills | Status: AC | PRN
Start: 2024-01-16 — End: ?

## 2024-01-16 MED ORDER — DAPAGLIFLOZIN PROPANEDIOL 10 MG PO TABS
10.0000 mg | ORAL_TABLET | Freq: Every day | ORAL | 3 refills | Status: AC
  Filled 2024-01-16: qty 90, 90d supply, fill #0
  Filled 2024-04-04: qty 90, 90d supply, fill #1

## 2024-01-16 MED ORDER — VALSARTAN 320 MG PO TABS: 320.0000 mg | ORAL_TABLET | Freq: Every day | ORAL | 3 refills | Status: AC

## 2024-01-16 NOTE — Patient Instructions (Signed)

## 2024-01-16 NOTE — Progress Notes (Signed)
 Subjective:  Patient ID: Zachary [name], male    DOB: January 08, 1966, 58 y.o.   MRN: [id number]  Patient Care Team: Severa Rock HERO, FNP as PCP - General (Family Medicine)   Chief Complaint:  Diabetes (3 month follow up )   HPI: Zachary [name] is a 58 y.o. male presenting on 01/16/2024 for Diabetes (3 month follow up )    Zachary [name] is a 58 year old male who presents for a follow-up on blood sugar management and medication refills.  His blood sugars have been stable since the increase in medication dose, except for a low reading of 54 mg/dL last night. He ate normally, and his sensor alerted him to the low. He notes that his blood sugar needs to drop below 40 mg/dL for him to experience symptoms.  He is currently taking valsartan , bupropion , and Cialis , all obtained from [employer]. He takes bupropion  at a dose of 300 mg daily without issues and feels his symptoms are well controlled. He also takes Farxiga , which he gets from [facility], and is due for a refill. There has been an increase in his weight from 247 to 250 pounds since his last visit. He has been on a 7.5 mg dose of a medication but plans to start a 10 mg dose now that he has finished the previous supply.  He denies any symptoms of high blood pressure and has not been monitoring it at home. He mentions a pulled muscle in his side, which he has been managing with ice and rest.  He missed a recent podiatry appointment due to an emergency involving his parents. He has been dealing with legal and financial issues related to a caregiver embezzling money from his parents, which has required significant time and effort to resolve. He lives next door to his parents and is actively involved in their care.         01/16/2024    8:57 AM 08/01/2023   12:13 PM 07/19/2023   11:02 AM 04/19/2023    3:07 PM 01/11/2023    3:08 PM  Depression screen PHQ 2/9  Decreased Interest 0 1 0 1 1  Down, Depressed, Hopeless 0 1 1 1   0  PHQ - 2 Score 0 2 1 2 1   Altered sleeping 2 3 3 2 1   Tired, decreased energy 0 1 1 2 1   Change in appetite 0 2 0 0 0  Feeling bad or failure about yourself  1 1 1  0 0  Trouble concentrating 3 1 3 2 1   Moving slowly or fidgety/restless 3 3 3 2 1   Suicidal thoughts 0 0 0 0 0  PHQ-9 Score 9 13 12 10 5   Difficult doing work/chores Not difficult at all Not difficult at all Not difficult at all Somewhat difficult Somewhat difficult      01/16/2024    8:58 AM 08/01/2023   12:13 PM 07/19/2023   11:03 AM 04/19/2023    3:07 PM  GAD 7 : Generalized Anxiety Score  Nervous, Anxious, on Edge 0 1 1 1   Control/stop worrying 0 2 1 1   Worry too much - different things 0 2 1 2   Trouble relaxing 1 2 2 1   Restless 3 3 3 1   Easily annoyed or irritable 1 1 2 1   Afraid - awful might happen 0 1 2 1   Total GAD 7 Score 5 12 12 8   Anxiety Difficulty Not difficult at all Not difficult  at all Not difficult at all Somewhat difficult        Relevant past medical, surgical, family, and social history reviewed and updated as indicated.  Allergies and medications reviewed and updated. Data reviewed: Chart in Epic.   Past Medical History:  Diagnosis Date   ADHD    Asthma    Binge eating disorder    Diabetes mellitus    GERD (gastroesophageal reflux disease)    Gout    Hyperlipidemia    Hypertension    Insomnia    Kidney stones    Low magnesium level    Vitamin D deficiency     Past Surgical History:  Procedure Laterality Date   WISDOM TOOTH EXTRACTION     WRIST SURGERY Right     Social History   Socioeconomic History   Marital status: Married    Spouse name: Not on file   Number of children: Not on file   Years of education: Not on file   Highest education level: Bachelor's degree (e.g., BA, AB, BS)  Occupational History   Not on file  Tobacco Use   Smoking status: Never   Smokeless tobacco: Never  Vaping Use   Vaping status: Never Used  Substance and Sexual Activity   Alcohol  use: Yes    Comment: occ   Drug use: No   Sexual activity: Not Currently  Other Topics Concern   Not on file  Social History Narrative   Lives with spouse and kids   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         Social Drivers of Health   Financial Resource Strain: Low Risk  (04/29/2023)   Overall Financial Resource Strain (CARDIA)    Difficulty of Paying Living Expenses: Not hard at all  Food Insecurity: No Food Insecurity (04/29/2023)   Hunger Vital Sign    Worried About Running Out of Food in the Last Year: Never true    Ran Out of Food in the Last Year: Never true  Transportation Needs: No Transportation Needs (04/29/2023)   PRAPARE - Administrator, Civil Service (Medical): No    Lack of Transportation (Non-Medical): No  Physical Activity: Insufficiently Active (04/29/2023)   Exercise Vital Sign    Days of Exercise per Week: 3 days    Minutes of Exercise per Session: 40 min  Stress: Stress Concern Present (04/29/2023)   [employer] of Occupational Health - Occupational Stress Questionnaire    Feeling of Stress : To some extent  Social Connections: Socially Integrated (04/29/2023)   Social Connection and Isolation Panel    Frequency of Communication with Friends and Family: More than three times a week    Frequency of Social Gatherings with Friends and Family: Three times a week    Attends Religious Services: More than 4 times per year    Active Member of Clubs or Organizations: Yes    Attends Banker Meetings: More than 4 times per year    Marital Status: Married  Catering manager Violence: Not At Risk (01/31/2022)   Received from [employer]   HITS    Over the last 12 months how often did your partner physically hurt you?: Never    Over the last 12 months how often did your partner insult you or talk down to you?: Never    Over the last 12 months how often did your partner threaten you with physical harm?: Never    Over the  last  12 months how often did your partner scream or curse at you?: Never    Outpatient Encounter Medications as of 01/16/2024  Medication Sig   AMBULATORY NON FORMULARY MEDICATION 0.2 mLs by Intracavernosal route as needed. Medication Name: Trimix  PGE 30mcg Pap 30mg  Phent 1mg    amLODipine  (NORVASC ) 5 MG tablet Take 1 tablet (5 mg total) by mouth daily.   aspirin 81 MG tablet Take 81 mg by mouth daily.    Continuous Glucose Receiver (FREESTYLE LIBRE 3 READER) DEVI 1 each by Does not apply route in the morning, at noon, in the evening, and at bedtime.   Continuous Glucose Sensor (FREESTYLE LIBRE 3 SENSOR) MISC Place 1 sensor onto the skin every 14 days. Use to check glucose continuously   Continuous Glucose Sensor (FREESTYLE LIBRE 3 SENSOR) MISC Place 1 sensor on the skin every 14 days. Use to check glucose continuously   cyclobenzaprine  (FLEXERIL ) 10 MG tablet Take 1 tablet (10 mg total) by mouth 3 (three) times daily as needed for muscle spasms.   famotidine  (PEPCID ) 20 MG tablet Take 1 tablet (20 mg total) by mouth 2 (two) times daily.   fluticasone  (FLONASE ) 50 MCG/ACT nasal spray Place 2 sprays into both nostrils daily.   ondansetron  (ZOFRAN -ODT) 4 MG disintegrating tablet Take 1 tablet (4 mg total) by mouth every 8 (eight) hours as needed for nausea or vomiting.   rosuvastatin  (CRESTOR ) 20 MG tablet Take 1 tablet (20 mg total) by mouth daily.   tirzepatide  (MOUNJARO ) 10 MG/0.5ML Pen Inject 10 mg into the skin once a week.   [DISCONTINUED] buPROPion  (WELLBUTRIN  XL) 300 MG 24 hr tablet Take 1 tablet (300 mg total) by mouth daily.   [DISCONTINUED] dapagliflozin  propanediol (FARXIGA ) 10 MG TABS tablet Take 1 tablet (10 mg total) by mouth daily.   [DISCONTINUED] tadalafil  (CIALIS ) 20 MG tablet Take 1 tablet (20 mg total) by mouth every other day as needed for erectile dysfunction.   buPROPion  (WELLBUTRIN  XL) 300 MG 24 hr tablet Take 1 tablet (300 mg total) by mouth daily.   dapagliflozin   propanediol (FARXIGA ) 10 MG TABS tablet Take 1 tablet (10 mg total) by mouth daily.   tadalafil  (CIALIS ) 20 MG tablet Take 1 tablet (20 mg total) by mouth every other day as needed for erectile dysfunction.   valsartan  (DIOVAN ) 320 MG tablet Take 1 tablet (320 mg total) by mouth daily.   [DISCONTINUED] valsartan  (DIOVAN ) 320 MG tablet Take 1 tablet (320 mg total) by mouth daily.   No facility-administered encounter medications on file as of 01/16/2024.    Allergies  Allergen Reactions   Ace Inhibitors     REACTION: cough   Sulfa Antibiotics Other (See Comments)    unk   Sulfamethoxazole-Trimethoprim     REACTION: unspecified    Pertinent ROS per HPI, otherwise unremarkable      Objective:  BP 128/80   Pulse 86   Temp 97.7 F (36.5 C)   Ht 6' 2 (1.88 m)   Wt 250 lb (113.4 kg)   SpO2 95%   BMI 32.10 kg/m    Wt Readings from Last 3 Encounters:  01/16/24 250 lb (113.4 kg)  12/27/23 247 lb (112 kg)  10/12/23 247 lb (112 kg)    Physical Exam Vitals and nursing note reviewed.  Constitutional:      General: He is not in acute distress.    Appearance: Normal appearance. He is obese. He is not ill-appearing, toxic-appearing or diaphoretic.  HENT:     Head:  Normocephalic and atraumatic.     Mouth/Throat:     Mouth: Mucous membranes are moist.  Eyes:     Pupils: Pupils are equal, round, and reactive to light.  Cardiovascular:     Rate and Rhythm: Normal rate and regular rhythm.     Pulses:          Dorsalis pedis pulses are 2+ on the right side and 2+ on the left side.       Posterior tibial pulses are 2+ on the right side and 2+ on the left side.     Heart sounds: Normal heart sounds.  Pulmonary:     Effort: Pulmonary effort is normal.     Breath sounds: Normal breath sounds.  Musculoskeletal:     Right lower leg: No edema.     Left lower leg: No edema.     Right foot: Normal range of motion. No deformity.     Left foot: Normal range of motion. No deformity.   Feet:     Right foot:     Protective Sensation: 10 sites tested.  10 sites sensed.     Skin integrity: Skin integrity normal.     Toenail Condition: Right toenails are normal.     Left foot:     Protective Sensation: 10 sites tested.  10 sites sensed.     Skin integrity: Skin integrity normal.     Toenail Condition: Left toenails are normal.  Skin:    General: Skin is warm and dry.     Capillary Refill: Capillary refill takes less than 2 seconds.  Neurological:     General: No focal deficit present.     Mental Status: He is alert and oriented to person, place, and time.  Psychiatric:        Mood and Affect: Mood normal.        Behavior: Behavior normal.        Thought Content: Thought content normal.        Judgment: Judgment normal.     Results for orders placed or performed in visit on 10/12/23  Bayer DCA Hb A1c Waived   Collection Time: 10/12/23  8:22 AM  Result Value Ref Range   HB A1C (BAYER DCA - WAIVED) 5.1 4.8 - 5.6 %  BMP8+EGFR   Collection Time: 10/12/23  8:30 AM  Result Value Ref Range   Glucose 84 70 - 99 mg/dL   BUN 26 (H) 6 - 24 mg/dL   Creatinine, Ser 8.66 (H) 0.76 - 1.27 mg/dL   eGFR 63 >40 fO/fpw/8.26   BUN/Creatinine Ratio 20 9 - 20   Sodium 142 134 - 144 mmol/L   Potassium 4.6 3.5 - 5.2 mmol/L   Chloride 106 96 - 106 mmol/L   CO2 19 (L) 20 - 29 mmol/L   Calcium  9.0 8.7 - 10.2 mg/dL       Pertinent labs & imaging results that were available during my care of the patient were reviewed by me and considered in my medical decision making.  Assessment & Plan:  Jahmarion Popoff was seen today for diabetes.  Diagnoses and all orders for this visit:  Type 2 diabetes mellitus with hyperglycemia, without long-term current use of insulin  (HCC) -     Bayer DCA Hb A1c Waived -     Basic metabolic panel with GFR -     dapagliflozin  propanediol (FARXIGA ) 10 MG TABS tablet; Take 1 tablet (10 mg total) by mouth daily.  Hypertension associated with diabetes  (  HCC) -     Bayer DCA Hb A1c Waived -     Basic metabolic panel with GFR -     valsartan  (DIOVAN ) 320 MG tablet; Take 1 tablet (320 mg total) by mouth daily.  Hyperlipidemia associated with type 2 diabetes mellitus (HCC) -     Bayer DCA Hb A1c Waived -     Basic metabolic panel with GFR  Depression, recurrent -     buPROPion  (WELLBUTRIN  XL) 300 MG 24 hr tablet; Take 1 tablet (300 mg total) by mouth daily.  ADHD (attention deficit hyperactivity disorder), inattentive type -     buPROPion  (WELLBUTRIN  XL) 300 MG 24 hr tablet; Take 1 tablet (300 mg total) by mouth daily.  Erectile dysfunction due to diabetes mellitus (HCC) -     Bayer DCA Hb A1c Waived -     tadalafil  (CIALIS ) 20 MG tablet; Take 1 tablet (20 mg total) by mouth every other day as needed for erectile dysfunction.  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  Encounter for immunization -     Flu vaccine trivalent PF, 6mos and older(Flulaval,Afluria,Fluarix,Fluzone)     Type 2 diabetes mellitus Type 2 diabetes mellitus with recent medication dosage adjustment. Blood glucose levels are well-controlled with occasional lows, the lowest being 54 mg/dL, without significant hypoglycemic symptoms. A1c is 5.1%. - Continue current diabetes management regimen. - Refill Farxiga  at [facility].  Hypertension Hypertension is well-controlled with no reported symptoms. Home blood pressure readings not provided, but no concerns raised. - Refill valsartan  at [facility].  Obesity Obesity with a slight weight increase from 247 lbs to 250 lbs. Currently on a weight management plan with medication adjustment to 10 mg dose. - Increase medication dose to 10 mg for  weight management.  Depression Depression is well-managed with bupropion  300 mg daily. No reported issues or symptoms indicating poor control. - Continue bupropion  300 mg daily. - Refill bupropion  at [facility].  Muscle strain Reported muscle strain with tightness but no significant impairment. - Apply ice and rest as needed. - Seek further evaluation if symptoms persist beyond two weeks.        Continue all other maintenance medications.  Follow up plan: Return in about 3 months (around 04/17/2024), or if symptoms worsen or fail to improve, for DM, HTN.   Continue healthy lifestyle choices, including diet (rich in fruits, vegetables, and lean proteins, and low in salt and simple carbohydrates) and exercise (at least 30 minutes of moderate physical activity daily).  Educational handout given for DM  The above assessment and management plan was discussed with the patient. The patient verbalized understanding of and has agreed to the management plan. Patient is aware to call the clinic if they develop any new symptoms or if symptoms persist or worsen. Patient is aware when to return to the clinic for a follow-up visit. Patient educated on when it is appropriate to go to the emergency department.   Rosaline Bruns, FNP-C [facility] [phone]

## 2024-01-17 ENCOUNTER — Ambulatory Visit: Payer: Self-pay | Admitting: Family Medicine

## 2024-01-17 LAB — BASIC METABOLIC PANEL WITH GFR
BUN/Creatinine Ratio: 17 (ref 9–20)
BUN: 26 mg/dL — ABNORMAL HIGH (ref 6–24)
CO2: 22 mmol/L (ref 20–29)
Calcium: 9 mg/dL (ref 8.7–10.2)
Chloride: 102 mmol/L (ref 96–106)
Creatinine, Ser: 1.49 mg/dL — ABNORMAL HIGH (ref 0.76–1.27)
Glucose: 91 mg/dL (ref 70–99)
Potassium: 4.5 mmol/L (ref 3.5–5.2)
Sodium: 138 mmol/L (ref 134–144)
eGFR: 55 mL/min/1.73 — ABNORMAL LOW (ref >59)

## 2024-01-18 ENCOUNTER — Ambulatory Visit: Admitting: Podiatry

## 2024-01-18 ENCOUNTER — Encounter: Payer: Self-pay | Admitting: Podiatry

## 2024-01-18 DIAGNOSIS — E1165 Type 2 diabetes mellitus with hyperglycemia: Secondary | ICD-10-CM | POA: Diagnosis not present

## 2024-01-18 DIAGNOSIS — M79675 Pain in left toe(s): Secondary | ICD-10-CM | POA: Diagnosis not present

## 2024-01-18 DIAGNOSIS — B351 Tinea unguium: Secondary | ICD-10-CM

## 2024-01-18 DIAGNOSIS — M79674 Pain in right toe(s): Secondary | ICD-10-CM

## 2024-01-18 NOTE — Progress Notes (Signed)
  Subjective:  Patient ID: Zachary [name], male    DOB: 03-15-1966,   MRN: [id number]  No chief complaint on file.   58 y.o. male presents for concern of thickened elongated and painful nails that are difficult to trim. Requesting to have them trimmed today. Denies  burning and tingling in their feet. Patient is diabetic and last A1c was  Lab Results  Component Value Date   HGBA1C 5.1 01/16/2024   .   PCP:  Severa Rock HERO, FNP    . Denies any other pedal complaints. Denies n/v/f/c.   Past Medical History:  Diagnosis Date   ADHD    Asthma    Binge eating disorder    Diabetes mellitus    GERD (gastroesophageal reflux disease)    Gout    Hyperlipidemia    Hypertension    Insomnia    Kidney stones    Low magnesium level    Vitamin D deficiency     Objective:  Physical Exam: Vascular: DP/PT pulses 2/4 bilateral. CFT <3 seconds. Absent hair growth on digits. Edema noted to bilateral lower extremities. Xerosis noted bilaterally.  Skin. No lacerations or abrasions bilateral feet. Nails 1-5 bilateral  are thickened discolored and elongated with subungual debris. Bilateral hallux nails healing well Musculoskeletal: MMT 5/5 bilateral lower extremities in DF, PF, Inversion and Eversion. Deceased ROM in DF of ankle joint.  Neurological: Sensation intact to light touch. Protective sensation slightly diminished bilateral.    Assessment:   1. Pain due to onychomycosis of toenails of both feet   2. Type 2 diabetes mellitus with hyperglycemia, without long-term current use of insulin  (HCC)      Plan:  Patient was evaluated and treated and all questions answered. -Discussed and educated patient on diabetic foot care, especially with  regards to the vascular, neurological and musculoskeletal systems.  -Stressed the importance of good glycemic control and the detriment of not  controlling glucose levels in relation to the foot. -Discussed supportive shoes at all times and checking feet  regularly.  -Mechanically debrided all nails 1-5 bilateral using sterile nail nipper and filed with dremel without incident  -Answered all patient questions -Patient to return  in 3 months for at risk foot care -Patient advised to call the office if any problems or questions arise in the meantime.   Asberry Failing, DPM

## 2024-01-24 DIAGNOSIS — F4320 Adjustment disorder, unspecified: Secondary | ICD-10-CM | POA: Diagnosis not present

## 2024-01-25 ENCOUNTER — Encounter: Payer: Self-pay | Admitting: Urology

## 2024-01-25 ENCOUNTER — Ambulatory Visit: Admitting: Urology

## 2024-01-25 VITALS — BP 62/34 | HR 64

## 2024-01-25 DIAGNOSIS — N529 Male erectile dysfunction, unspecified: Secondary | ICD-10-CM | POA: Diagnosis not present

## 2024-01-25 NOTE — Patient Instructions (Signed)

## 2024-01-25 NOTE — Progress Notes (Signed)
 01/25/2024 11:45 AM   Lynwood ONEIDA Daughters 05/18/1965 [id number]  Referring provider: Severa Rock HERO, FNP [address] [place],  [place] [address]  Erectile dysfunction   HPI: Mr Roessner is a 58yo here for trimix injection teaching.   PMH: Past Medical History:  Diagnosis Date   ADHD    Asthma    Binge eating disorder    Diabetes mellitus    GERD (gastroesophageal reflux disease)    Gout    Hyperlipidemia    Hypertension    Insomnia    Kidney stones    Low magnesium level    Vitamin D deficiency     Surgical History: Past Surgical History:  Procedure Laterality Date   WISDOM TOOTH EXTRACTION     WRIST SURGERY Right     Home Medications:  Allergies as of 01/25/2024       Reactions   Ace Inhibitors    REACTION: cough   Sulfa Antibiotics Other (See Comments)   unk   Sulfamethoxazole-trimethoprim    REACTION: unspecified        Medication List        Accurate as of January 25, 2024 11:45 AM. If you have any questions, ask your nurse or doctor.          AMBULATORY NON FORMULARY MEDICATION 0.2 mLs by Intracavernosal route as needed. Medication Name: Trimix  PGE 30mcg Pap 30mg  Phent 1mg    amLODipine  5 MG tablet Commonly known as: NORVASC  Take 1 tablet (5 mg total) by mouth daily.   aspirin 81 MG tablet Take 81 mg by mouth daily.   buPROPion  300 MG 24 hr tablet Commonly known as: Wellbutrin  XL Take 1 tablet (300 mg total) by mouth daily.   cyclobenzaprine  10 MG tablet Commonly known as: FLEXERIL  Take 1 tablet (10 mg total) by mouth 3 (three) times daily as needed for muscle spasms.   famotidine  20 MG tablet Commonly known as: Pepcid  Take 1 tablet (20 mg total) by mouth 2 (two) times daily.   Farxiga  10 MG Tabs tablet Generic drug: dapagliflozin  propanediol Take 1 tablet (10 mg total) by mouth daily.   fluticasone  50 MCG/ACT nasal spray Commonly known as: FLONASE  Place 2 sprays into both nostrils daily.   FreeStyle Libre 3 Reader  Devi 1 each by Does not apply route in the morning, at noon, in the evening, and at bedtime.   FreeStyle Libre 3 Sensor Misc Place 1 sensor onto the skin every 14 days. Use to check glucose continuously   FreeStyle Libre 3 Sensor Misc Place 1 sensor on the skin every 14 days. Use to check glucose continuously   Mounjaro  10 MG/0.5ML Pen Generic drug: tirzepatide  Inject 10 mg into the skin once a week.   ondansetron  4 MG disintegrating tablet Commonly known as: ZOFRAN -ODT Take 1 tablet (4 mg total) by mouth every 8 (eight) hours as needed for nausea or vomiting.   rosuvastatin  20 MG tablet Commonly known as: CRESTOR  Take 1 tablet (20 mg total) by mouth daily.   tadalafil  20 MG tablet Commonly known as: CIALIS  Take 1 tablet (20 mg total) by mouth every other day as needed for erectile dysfunction.   valsartan  320 MG tablet Commonly known as: DIOVAN  Take 1 tablet (320 mg total) by mouth daily.        Allergies:  Allergies  Allergen Reactions   Ace Inhibitors     REACTION: cough   Sulfa Antibiotics Other (See Comments)    unk   Sulfamethoxazole-Trimethoprim  REACTION: unspecified    Family History: Family History  Problem Relation Age of Onset   Breast cancer Mother    Hypertension Mother    Pneumonia Mother    Crohn's disease Mother    Leukemia Father    GER disease Father    Macular degeneration Father    Alcohol abuse Brother    ADD / ADHD Son    Stroke Maternal Grandmother    Stroke Paternal Grandmother     Social History:  reports that he has never smoked. He has never used smokeless tobacco. He reports current alcohol use. He reports that he does not use drugs.  ROS: All other review of systems were reviewed and are negative except what is noted above in HPI  Physical Exam: There were no vitals taken for this visit.  Constitutional:  Alert and oriented, No acute distress. HEENT: [place] AT, moist mucus membranes.  Trachea midline, no  masses. Cardiovascular: No clubbing, cyanosis, or edema. Respiratory: Normal respiratory effort, no increased work of breathing. GI: Abdomen is soft, nontender, nondistended, no abdominal masses GU: No CVA tenderness.  Lymph: No cervical or inguinal lymphadenopathy. Skin: No rashes, bruises or suspicious lesions. Neurologic: Grossly intact, no focal deficits, moving all 4 extremities. Psychiatric: Normal mood and affect.  Laboratory Data: Lab Results  Component Value Date   WBC 6.6 04/19/2023   HGB 17.0 04/19/2023   HCT 49.8 04/19/2023   MCV 94 04/19/2023   PLT 210 04/19/2023    Lab Results  Component Value Date   CREATININE 1.49 (H) 01/16/2024    Lab Results  Component Value Date   PSA 0.55 05/22/2018   PSA 0.26 08/06/2008    No results found for: TESTOSTERONE  Lab Results  Component Value Date   HGBA1C 5.1 01/16/2024    Urinalysis    Component Value Date/Time   COLORURINE LT. YELLOW 08/06/2008 0733   APPEARANCEUR CLEAR 08/06/2008 0733   LABSPEC 1.020 08/06/2008 0733   PHURINE 6.0 08/06/2008 0733   GLUCOSEU NEGATIVE 08/06/2008 0733   BILIRUBINUR N 09/04/2016 1226   KETONESUR NEGATIVE 08/06/2008 0733   PROTEINUR 1+ 09/04/2016 1226   UROBILINOGEN 0.2 09/04/2016 1226   UROBILINOGEN 0.2 08/06/2008 0733   NITRITE N 09/04/2016 1226   NITRITE NEGATIVE 08/06/2008 0733   LEUKOCYTESUR Negative 09/04/2016 1226    Lab Results  Component Value Date   LABMICR 154.3 04/19/2023    Pertinent Imaging:  No results found for this or any previous visit.  No results found for this or any previous visit.  No results found for this or any previous visit.  No results found for this or any previous visit.  Results for orders placed during the hospital encounter of 07/02/23  US  RENAL  Narrative CLINICAL DATA:  CKD STAGE 3A, GFR 45-59 ML/MIN  EXAM: RENAL / URINARY TRACT ULTRASOUND COMPLETE  COMPARISON:  None Available.  FINDINGS: Right Kidney:  Renal  measurements: 12.8 x 5.9 x 5.6 cm = volume: 221 mL. Echogenicity within normal limits. No mass or hydronephrosis visualized.  Left Kidney:  Renal measurements: 8.1 x 4.5 x 4.5 cm = volume: 87 mL. Echogenicity within normal limits. No mass or hydronephrosis visualized.  Bladder:  Appears normal for degree of bladder distention. Both ureteral jets visualized.  Other:  None.  IMPRESSION: 1.  No hydronephrosis or nephrolithiasis. 2. Mildly atrophic left kidney.   Electronically Signed By: Rogelia Myers M.D. On: 07/02/2023 15:40  No results found for this or any previous visit.  No results  found for this or any previous visit.  No results found for this or any previous visit.  Trimix injection instruction:  Patient instructed to draw 0.69ml of trimix into the insulin  syringe. I them instructed him to inject at the mid penile shaft at either the 3 or 9 o'clock position. I then injected the patient and he achieved a good erection in 20 minutes. Penile injection completed.    Assessment & Plan:    1. Erectile dysfunction, unspecified erectile dysfunction type (Primary) The patient was instructed on proper technique for trimix injection. He is instructed to alternate side and location of the injection. He was instructed in titrating the trimix dose. He is instructed to call the office for an erection lasting more than 4 hours    No follow-ups on file.  Belvie Clara, MD  [facility] [place]

## 2024-01-26 ENCOUNTER — Other Ambulatory Visit: Payer: Self-pay

## 2024-01-26 ENCOUNTER — Ambulatory Visit
Admission: RE | Admit: 2024-01-26 | Discharge: 2024-01-26 | Disposition: A | Discharge status: Home / Self Care | Source: Ambulatory Visit | Attending: Family Medicine | Admitting: Family Medicine

## 2024-01-26 VITALS — BP 140/93 | HR 83 | Temp 98.3°F

## 2024-01-26 DIAGNOSIS — M545 Low back pain, unspecified: Secondary | ICD-10-CM

## 2024-01-26 DIAGNOSIS — S39012A Strain of muscle, fascia and tendon of lower back, initial encounter: Secondary | ICD-10-CM | POA: Diagnosis not present

## 2024-01-26 DIAGNOSIS — M62830 Muscle spasm of back: Secondary | ICD-10-CM

## 2024-01-26 MED ORDER — PREDNISONE 10 MG (21) PO TBPK: ORAL_TABLET | Freq: Every day | ORAL | 0 refills | Status: DC

## 2024-01-26 MED ORDER — METHOCARBAMOL 500 MG PO TABS
500.0000 mg | ORAL_TABLET | Freq: Three times a day (TID) | ORAL | 0 refills | Status: AC | PRN
Start: 2024-01-26 — End: ?

## 2024-01-26 MED ORDER — METHYLPREDNISOLONE SODIUM SUCC 125 MG IJ SOLR
125.0000 mg | Freq: Once | INTRAMUSCULAR | Status: AC
  Administered 2024-01-26: 125 mg via INTRAMUSCULAR

## 2024-01-26 NOTE — ED Provider Notes (Signed)
 [name]    CSN: [id number] Arrival date & time: 01/26/24  0853      History   Chief Complaint Chief Complaint  Patient presents with   Back Pain    Lower RT    HPI Zachary [name] is a 58 y.o. male.   HPI Very pleasant 58 year old male presents with right sided low back pain secondary to bending over yesterday at his home.  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  Patient is accompanied by his wife this morning.  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 (HCC) 10/12/2023   Erectile dysfunction due to diabetes mellitus (HCC) 09/05/2022   CKD stage 3 due to type 2 diabetes mellitus (HCC) 09/05/2022   Rosacea 09/05/2022   Depression, recurrent 09/05/2022   NAFLD (nonalcoholic fatty liver disease) 90/79/7977   Gastroesophageal reflux disease without esophagitis 07/10/2019   Obesity, Class III, BMI 40-49.9 (morbid obesity) (HCC) 03/30/2017   Type 2 diabetes mellitus with hyperglycemia (HCC) 10/29/2014   Mild intermittent asthma without complication 08/06/2009   Hyperlipidemia associated with type 2 diabetes mellitus (HCC) 08/06/2009   Hypertension associated with diabetes (HCC) 05/01/2007   ADHD (attention deficit hyperactivity disorder), inattentive type 08/29/2006   Binge eating disorder 03/27/1988    Past Surgical History:  Procedure Laterality Date   WISDOM TOOTH EXTRACTION     WRIST SURGERY Right        Home Medications    Prior to Admission medications   Medication Sig Start Date End Date Taking? Authorizing Provider  methocarbamol (ROBAXIN)  500 MG tablet Take 1 tablet (500 mg total) by mouth 3 (three) times daily as needed. 01/26/24  Yes Teddy Sharper, FNP  predniSONE  (STERAPRED UNI-PAK 21 TAB) 10 MG (21) TBPK tablet Take by mouth daily. Take 6 tabs by mouth daily  for 2 days, then 5 tabs for 2 days, then 4 tabs for 2 days, then 3 tabs for 2 days, 2 tabs for 2 days, then 1 tab by mouth daily for 2 days 01/26/24  Yes Teddy Sharper, FNP  AMBULATORY NON FORMULARY MEDICATION 0.2 mLs by Intracavernosal route as needed. Medication Name: Trimix  PGE 30mcg Pap 30mg  Phent 1mg  11/21/23   McKenzie, Belvie CROME, MD  amLODipine  (NORVASC ) 5 MG tablet Take 1 tablet (5 mg total) by mouth daily. 07/19/23   Severa Rock HERO, FNP  aspirin 81 MG tablet Take 81 mg by mouth daily.     [provider]  buPROPion  (WELLBUTRIN  XL) 300 MG 24 hr tablet Take 1 tablet (300 mg total) by mouth daily. 01/16/24   Severa Rock HERO, FNP  Continuous Glucose Receiver (FREESTYLE LIBRE 3 READER) DEVI 1 each by Does not apply route in the morning, at noon, in the evening, and at bedtime. 10/04/22   Severa Rock HERO, FNP  Continuous Glucose Sensor (FREESTYLE LIBRE 3 SENSOR) MISC Place 1 sensor onto the skin every 14 days. Use to check glucose continuously 04/30/23   Rakes, Rock HERO, FNP  Continuous Glucose Sensor (FREESTYLE LIBRE 3 SENSOR) MISC Place 1 sensor on the skin every 14 days. Use to check glucose continuously 10/12/23   Severa Rock HERO, FNP  cyclobenzaprine  (FLEXERIL ) 10 MG tablet Take 1 tablet (10 mg total) by mouth 3 (three) times daily as needed for muscle spasms. 12/27/23   Severa Rock HERO, FNP  dapagliflozin  propanediol (FARXIGA ) 10 MG TABS tablet Take 1 tablet (10 mg total) by mouth daily. 01/16/24   Severa Rock HERO, FNP  famotidine  (PEPCID ) 20 MG tablet Take 1 tablet (20 mg total) by mouth 2 (two) times daily. 10/12/23   Severa Rock HERO, FNP  fluticasone  (FLONASE ) 50 MCG/ACT nasal spray Place 2 sprays into both nostrils daily. 04/27/23   Gladis, Mary-Margaret, FNP   ondansetron  (ZOFRAN -ODT) 4 MG disintegrating tablet Take 1 tablet (4 mg total) by mouth every 8 (eight) hours as needed for nausea or vomiting. 08/01/23   Severa Rock HERO, FNP  rosuvastatin  (CRESTOR ) 20 MG tablet Take 1 tablet (20 mg total) by mouth daily. 10/12/23   Severa Rock HERO, FNP  tadalafil  (CIALIS ) 20 MG tablet Take 1 tablet (20 mg total) by mouth every other day as needed for erectile dysfunction. 01/16/24   Severa Rock HERO, FNP  tirzepatide  (MOUNJARO ) 10 MG/0.5ML Pen Inject 10 mg into the skin once a week. 12/27/23   Severa Rock HERO, FNP  valsartan  (DIOVAN ) 320 MG tablet Take 1 tablet (320 mg total) by mouth daily. 01/16/24   Rakes, Rock HERO, FNP    Family History Family History  Problem Relation Age of Onset   Breast cancer Mother    Hypertension Mother    Pneumonia Mother    Crohn's disease Mother    Leukemia Father    GER disease Father    Macular degeneration Father    Alcohol abuse Brother    ADD / ADHD Son    Stroke Maternal Grandmother    Stroke Paternal Grandmother     Social History Social History   Tobacco Use   Smoking status: Never   Smokeless tobacco: Never  Vaping Use   Vaping status: Never Used  Substance Use Topics   Alcohol use: Yes    Comment: occ   Drug use: No     Allergies   Ace inhibitors, Sulfa antibiotics, and Sulfamethoxazole-trimethoprim   Review of Systems Review of Systems  Musculoskeletal:  Positive for back pain.     Physical Exam Triage Vital Signs ED Triage Vitals  Encounter Vitals Group     BP 01/26/24 0915 (!) 140/93     Girls Systolic BP Percentile --      Girls Diastolic BP Percentile --      Boys Systolic BP Percentile --      Boys Diastolic BP Percentile --      Pulse Rate 01/26/24 0915 83     Resp --      Temp 01/26/24 0915 98.3 F (36.8 C)     Temp Source 01/26/24 0915 Oral     SpO2 01/26/24 0915 97 %     Weight --      Height --      Head Circumference --      Peak Flow --      Pain Score 01/26/24 0913 6      Pain Loc --      Pain Education --      Exclude from Growth Chart --    No data found.  Updated Vital Signs BP (!) 140/93 (BP Location:  Left Arm)   Pulse 83   Temp 98.3 F (36.8 C) (Oral)   SpO2 97%   Physical Exam Vitals and nursing note reviewed.  Constitutional:      General: He is not in acute distress.    Appearance: Normal appearance. He is normal weight. He is not ill-appearing or toxic-appearing.  HENT:     Head: Normocephalic and atraumatic.     Mouth/Throat:     Mouth: Mucous membranes are moist.     Pharynx: Oropharynx is clear.  Eyes:     Extraocular Movements: Extraocular movements intact.     Pupils: Pupils are equal, round, and reactive to light.  Cardiovascular:     Pulses: Normal pulses.     Heart sounds: Normal heart sounds.  Pulmonary:     Effort: Pulmonary effort is normal.     Breath sounds: Normal breath sounds. No wheezing, rhonchi or rales.  Musculoskeletal:        General: Normal range of motion.     Comments: Lumbar spine left-sided (inferior aspect): TTP over paraspinous muscles/inferior spinal erectors with palpable muscle adhesions noted  Skin:    General: Skin is warm and dry.     Findings: Lesion present.  Neurological:     General: No focal deficit present.     Mental Status: He is alert and oriented to person, place, and time. Mental status is at baseline.      UC Treatments / Results  Labs (all labs ordered are listed, but only abnormal results are displayed) Labs Reviewed - No data to display  EKG   Radiology No results found.  Procedures Procedures (including critical care time)  Medications Ordered in UC Medications  methylPREDNISolone  sodium succinate (SOLU-MEDROL ) 125 mg/2 mL injection 125 mg (125 mg Intramuscular Given 01/26/24 0937)    Initial Impression / Assessment and Plan / UC Course  I have reviewed the triage vital signs and the nursing notes.  Pertinent labs & imaging results that were available  during my care of the patient were reviewed by me and considered in my medical decision making (see chart for details).     MDM: 1.  Acute left-sided low back pain without sciatica-IM Solu-Medrol  125 mg given once in clinic and prior to discharge; 2.  Strain of lumbar region, initial encounter-Rx'd Sterapred Unipak (42 tab 10 mg taper): Take as directed; 3.  Muscle spasm of back-Rx'd Robaxin 500 mg tablet: Take 1 tablet 3 times daily, as needed. Advised patient to take medication as directed with food to completion.  Advised may use Robaxin daily or as needed for accompanying muscle spasms of back.  Encouraged to increase daily water intake to 64 ounces per day while taking these medications.  Advised if symptoms worsen and are unresolved please follow-up with your PCP or here for further evaluation.  Patient discharged home, hemodynamically stable. Final Clinical Impressions(s) / UC Diagnoses   Final diagnoses:  Strain of lumbar region, initial encounter  Acute left-sided low back pain without sciatica  Muscle spasm of back     Discharge Instructions      Advised patient to take medication as directed with food to completion.  Advised may use Robaxin daily or as needed for accompanying muscle spasms of back.  Encouraged to increase daily water intake to 64 ounces per day while taking these medications.  Advised if symptoms worsen and are unresolved please follow-up with your PCP or here for further evaluation.     ED Prescriptions  Medication Sig Dispense Auth. Provider   predniSONE  (STERAPRED UNI-PAK 21 TAB) 10 MG (21) TBPK tablet Take by mouth daily. Take 6 tabs by mouth daily  for 2 days, then 5 tabs for 2 days, then 4 tabs for 2 days, then 3 tabs for 2 days, 2 tabs for 2 days, then 1 tab by mouth daily for 2 days 42 tablet Teddy Sharper, FNP   methocarbamol (ROBAXIN) 500 MG tablet Take 1 tablet (500 mg total) by mouth 3 (three) times daily as needed. 30 tablet Jarian Longoria, FNP       PDMP not reviewed this encounter.   Teddy Sharper, FNP 01/26/24 [id number]

## 2024-01-26 NOTE — Discharge Instructions (Addendum)
 Advised patient to take medication as directed with food to completion.  Advised may use Robaxin daily or as needed for accompanying muscle spasms of back.  Encouraged to increase daily water intake to 64 ounces per day while taking these medications.  Advised if symptoms worsen and are unresolved please follow-up with your PCP or here for further evaluation.

## 2024-01-26 NOTE — ED Triage Notes (Signed)
 Pt c/o RT sided lower back pain since yesterday. Says he was bending over to grab the cat and felt a sharp shooting pain. No hx of sciatica. Flexeril  and motrin prn.

## 2024-01-27 ENCOUNTER — Telehealth: Payer: Self-pay

## 2024-01-27 NOTE — Telephone Encounter (Signed)
 Called to check on patient. Got meds. Pain better; 2/10 versus 6/10 at visit. Had a massage. No needs from ucc.

## 2024-01-28 ENCOUNTER — Encounter: Payer: Self-pay | Admitting: Radiology

## 2024-01-29 ENCOUNTER — Encounter: Payer: Self-pay | Admitting: Urology

## 2024-01-29 DIAGNOSIS — F4320 Adjustment disorder, unspecified: Secondary | ICD-10-CM | POA: Diagnosis not present

## 2024-01-30 ENCOUNTER — Encounter: Payer: Self-pay | Admitting: Physician Assistant

## 2024-01-30 ENCOUNTER — Other Ambulatory Visit: Payer: Self-pay

## 2024-01-30 ENCOUNTER — Ambulatory Visit: Admitting: Physician Assistant

## 2024-01-30 DIAGNOSIS — M25522 Pain in left elbow: Secondary | ICD-10-CM

## 2024-01-30 NOTE — Progress Notes (Signed)
 HPI: Zachary [name] is a 58 year old male well-known to Dr. Vernetta.  About a month ago he injured his left elbow while playing golf.  He reports that he was swinging a ball and hit a root.  He initially sent Dr. Vernetta a message via MyChart due to bruising forearm.  He had decreased range of motion.  No numbness tingling.  He continues to have an area of ecchymosis that is traveling down the arm.  Feels his pain is getting better.  Ranks his pain over the last 2 to 3 days to be 2 out of 10.  Review of systems: See HPI.  Physical exam: General no acute distress mood and affect appropriate. Psych: Alert and orient x 3 Bilateral elbows full extension full flexion full supination bilateral forearms full pronation bilateral forearms.  Has tenderness left arm only over the lateral epicondyle distal biceps and distal triceps insertion.  Has 5 out of 5 strength biceps triceps bilaterally against resistance.  Ecchymosis over the dorsal aspect of the left upper forearm.  No ecchymosis or erythema about the elbow itself on the left.  Provocative maneuvers of the left wrist causes no pain at the elbow.  Radiographs: 3 views left elbow: Elbow is well located.  No acute fractures acute findings.  No bony lesions.  Impression: Left elbow strain.  Plan: Due to the fact that he is improving recommend modification of activities as tolerated..  Did discuss with him that the area of ecchymosis may go into the hand and cause hand pain at some point in time.  Questions are encouraged and answered.  Follow-up as needed pain persist or becomes worse.

## 2024-02-06 DIAGNOSIS — F4320 Adjustment disorder, unspecified: Secondary | ICD-10-CM | POA: Diagnosis not present

## 2024-02-07 DIAGNOSIS — F4320 Adjustment disorder, unspecified: Secondary | ICD-10-CM | POA: Diagnosis not present

## 2024-02-15 DIAGNOSIS — F4320 Adjustment disorder, unspecified: Secondary | ICD-10-CM | POA: Diagnosis not present

## 2024-02-19 DIAGNOSIS — F4320 Adjustment disorder, unspecified: Secondary | ICD-10-CM | POA: Diagnosis not present

## 2024-02-27 DIAGNOSIS — F4320 Adjustment disorder, unspecified: Secondary | ICD-10-CM | POA: Diagnosis not present

## 2024-03-06 DIAGNOSIS — F4320 Adjustment disorder, unspecified: Secondary | ICD-10-CM | POA: Diagnosis not present

## 2024-03-13 DIAGNOSIS — F4320 Adjustment disorder, unspecified: Secondary | ICD-10-CM | POA: Diagnosis not present

## 2024-04-04 ENCOUNTER — Other Ambulatory Visit (HOSPITAL_BASED_OUTPATIENT_CLINIC_OR_DEPARTMENT_OTHER): Payer: Self-pay

## 2024-04-04 ENCOUNTER — Other Ambulatory Visit: Payer: Self-pay

## 2024-04-04 ENCOUNTER — Telehealth: Payer: Self-pay | Admitting: Family Medicine

## 2024-04-04 NOTE — Telephone Encounter (Unsigned)
 Copied from CRM [id number]. Topic: General - Other >> Apr 04, 2024  9:58 AM Alfonso ORN wrote: Reason for CRM:   Patient want to send message use to work with Rakes,Linda M for 24 yrs and its basketball season and Madie is a terrible team ,(patient joking)

## 2024-04-08 ENCOUNTER — Other Ambulatory Visit (HOSPITAL_BASED_OUTPATIENT_CLINIC_OR_DEPARTMENT_OTHER): Payer: Self-pay

## 2024-04-08 ENCOUNTER — Ambulatory Visit: Admitting: Family Medicine

## 2024-04-08 ENCOUNTER — Ambulatory Visit: Payer: Self-pay | Admitting: Family Medicine

## 2024-04-08 ENCOUNTER — Encounter: Payer: Self-pay | Admitting: Family Medicine

## 2024-04-08 VITALS — BP 128/77 | HR 84 | Temp 98.0°F | Ht 74.0 in | Wt 256.0 lb

## 2024-04-08 DIAGNOSIS — Z7984 Long term (current) use of oral hypoglycemic drugs: Secondary | ICD-10-CM | POA: Diagnosis not present

## 2024-04-08 DIAGNOSIS — E1169 Type 2 diabetes mellitus with other specified complication: Secondary | ICD-10-CM

## 2024-04-08 DIAGNOSIS — E1165 Type 2 diabetes mellitus with hyperglycemia: Secondary | ICD-10-CM

## 2024-04-08 DIAGNOSIS — N183 Chronic kidney disease, stage 3 unspecified: Secondary | ICD-10-CM

## 2024-04-08 DIAGNOSIS — E113553 Type 2 diabetes mellitus with stable proliferative diabetic retinopathy, bilateral: Secondary | ICD-10-CM | POA: Diagnosis not present

## 2024-04-08 DIAGNOSIS — E785 Hyperlipidemia, unspecified: Secondary | ICD-10-CM

## 2024-04-08 DIAGNOSIS — E1159 Type 2 diabetes mellitus with other circulatory complications: Secondary | ICD-10-CM

## 2024-04-08 DIAGNOSIS — Z7985 Long-term (current) use of injectable non-insulin antidiabetic drugs: Secondary | ICD-10-CM

## 2024-04-08 DIAGNOSIS — E1122 Type 2 diabetes mellitus with diabetic chronic kidney disease: Secondary | ICD-10-CM

## 2024-04-08 DIAGNOSIS — I152 Hypertension secondary to endocrine disorders: Secondary | ICD-10-CM | POA: Diagnosis not present

## 2024-04-08 DIAGNOSIS — F5101 Primary insomnia: Secondary | ICD-10-CM

## 2024-04-08 LAB — BASIC METABOLIC PANEL WITH GFR
BUN/Creatinine Ratio: 17 (ref 9–20)
BUN: 24 mg/dL (ref 6–24)
CO2: 21 mmol/L (ref 20–29)
Calcium: 9.4 mg/dL (ref 8.7–10.2)
Chloride: 104 mmol/L (ref 96–106)
Creatinine, Ser: 1.44 mg/dL — ABNORMAL HIGH (ref 0.76–1.27)
Glucose: 92 mg/dL (ref 70–99)
Potassium: 4.6 mmol/L (ref 3.5–5.2)
Sodium: 141 mmol/L (ref 134–144)
eGFR: 57 mL/min/1.73 — ABNORMAL LOW

## 2024-04-08 LAB — BAYER DCA HB A1C WAIVED: HB A1C (BAYER DCA - WAIVED): 5.4 % (ref 4.8–5.6)

## 2024-04-08 MED ORDER — TRAZODONE HCL 50 MG PO TABS
25.0000 mg | ORAL_TABLET | Freq: Every evening | ORAL | 3 refills | Status: AC | PRN
  Filled 2024-04-08: qty 90, 90d supply, fill #0

## 2024-04-08 NOTE — Patient Instructions (Addendum)

## 2024-04-08 NOTE — Progress Notes (Signed)
 "    Subjective:  Patient ID: Zachary [name], male    DOB: [birth year], 59 y.o.   MRN: [id number]  Patient Care Team: Severa Rock HERO, FNP as PCP - General (Family Medicine)   Chief Complaint:  Medical Management of Chronic Issues and trouble sleeping    HPI: Zachary [name] is a 59 y.o. male presenting on 04/08/2024 for Medical Management of Chronic Issues and trouble sleeping    Zachary [name] is a 59 year old male with diabetes who presents with sleep disturbances and diabetes management.  He experiences ongoing sleep disturbances and has tried various forms of melatonin, including 10 mg slow release and fast release, without success. He is concerned about potential heart disease risks associated with melatonin use. He has previously used Restoril  but has not tried Ambien, trazodone , or Atarax. He finds the side effects of some medications intolerable.  He has a history of diabetes and is currently taking Farxiga  and Mounjaro . There are no changes in urine output, blood pressure, headaches, chest pain, or vision changes. He needs to schedule a follow-up with his eye doctor for diabetic retinopathy. He does not monitor his blood pressure at home as he has not experienced symptoms warranting it. His weight is stable around 250 pounds, which he maintains is a good range for him.  He is dealing with significant stress related to his parents' health issues. His father has ventricular bigeminy, a non-perfusing PVC, a left atrial thrombus, and a bladder wall tumor. His father is awaiting a pacemaker consult and has recently switched from Coumadin to Eliquis after an INR of 4.5. 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 This situation is impacting his sleep and overall well-being.          Relevant past medical, surgical, family, and social history reviewed and updated as indicated.  Allergies and medications  reviewed and updated. Data reviewed: Chart in Epic.   Past Medical History:  Diagnosis Date   ADHD    Asthma    Binge eating disorder    Diabetes mellitus    GERD (gastroesophageal reflux disease)    Gout    Hyperlipidemia    Hypertension    Insomnia    Kidney stones    Low magnesium level    Vitamin D deficiency     Past Surgical History:  Procedure Laterality Date   WISDOM TOOTH EXTRACTION     WRIST SURGERY Right     Social History   Socioeconomic History   Marital status: Married    Spouse name: Not on file   Number of children: Not on file   Years of education: Not on file   Highest education level: Bachelor's degree (e.g., BA, AB, BS)  Occupational History   Not on file  Tobacco Use   Smoking status: Never   Smokeless tobacco: Never  Vaping Use   Vaping status: Never Used  Substance and Sexual Activity   Alcohol use: Yes    Comment: occ   Drug use: No   Sexual activity: Not Currently  Other Topics Concern   Not on file  Social History Narrative   Lives with spouse and kids   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         Social Drivers of Health   Tobacco Use: Low Risk (04/08/2024)   Patient History    Smoking Tobacco Use:  Never    Smokeless Tobacco Use: Never    Passive Exposure: Not on file  Financial Resource Strain: Low Risk (04/29/2023)   Overall Financial Resource Strain (CARDIA)    Difficulty of Paying Living Expenses: Not hard at all  Food Insecurity: No Food Insecurity (04/29/2023)   Hunger Vital Sign    Worried About Running Out of Food in the Last Year: Never true    Ran Out of Food in the Last Year: Never true  Transportation Needs: No Transportation Needs (04/29/2023)   PRAPARE - Administrator, Civil Service (Medical): No    Lack of Transportation (Non-Medical): No  Physical Activity: Insufficiently Active (04/29/2023)   Exercise Vital Sign    Days of Exercise per Week: 3 days    Minutes of Exercise per  Session: 40 min  Stress: Stress Concern Present (04/29/2023)   [employer] of Occupational Health - Occupational Stress Questionnaire    Feeling of Stress : To some extent  Social Connections: Socially Integrated (04/29/2023)   Social Connection and Isolation Panel    Frequency of Communication with Friends and Family: More than three times a week    Frequency of Social Gatherings with Friends and Family: Three times a week    Attends Religious Services: More than 4 times per year    Active Member of Clubs or Organizations: Yes    Attends Banker Meetings: More than 4 times per year    Marital Status: Married  Catering Manager Violence: Not At Risk (01/31/2022)   Received from [employer]   HITS    Over the last 12 months how often did your partner physically hurt you?: Never    Over the last 12 months how often did your partner insult you or talk down to you?: Never    Over the last 12 months how often did your partner threaten you with physical harm?: Never    Over the last 12 months how often did your partner scream or curse at you?: Never  Depression (PHQ2-9): High Risk (04/08/2024)   Depression (PHQ2-9)    PHQ-2 Score: 13  Alcohol Screen: Low Risk (04/29/2023)   Alcohol Screen    Last Alcohol Screening Score (AUDIT): 1  Housing: Low Risk (04/29/2023)   Housing Stability Vital Sign    Unable to Pay for Housing in the Last Year: No    Number of Times Moved in the Last Year: 0    Homeless in the Last Year: No  Utilities: Not on file  Health Literacy: Not on file    Outpatient Encounter Medications as of 04/08/2024  Medication Sig   AMBULATORY NON FORMULARY MEDICATION 0.2 mLs by Intracavernosal route as needed. Medication Name: Trimix  PGE 30mcg Pap 30mg  Phent 1mg    amLODipine  (NORVASC ) 5 MG tablet Take 1 tablet (5 mg total) by mouth daily.   aspirin 81 MG tablet Take 81 mg by mouth daily.    buPROPion  (WELLBUTRIN  XL) 300 MG 24 hr tablet Take 1 tablet (300 mg  total) by mouth daily.   Continuous Glucose Receiver (FREESTYLE LIBRE 3 READER) DEVI 1 each by Does not apply route in the morning, at noon, in the evening, and at bedtime.   Continuous Glucose Sensor (FREESTYLE LIBRE 3 SENSOR) MISC Place 1 sensor onto the skin every 14 days. Use to check glucose continuously   Continuous Glucose Sensor (FREESTYLE LIBRE 3 SENSOR) MISC Place 1 sensor on the skin every 14 days. Use to check glucose continuously  cyclobenzaprine  (FLEXERIL ) 10 MG tablet Take 1 tablet (10 mg total) by mouth 3 (three) times daily as needed for muscle spasms.   dapagliflozin  propanediol (FARXIGA ) 10 MG TABS tablet Take 1 tablet (10 mg total) by mouth daily.   famotidine  (PEPCID ) 20 MG tablet Take 1 tablet (20 mg total) by mouth 2 (two) times daily.   fluticasone  (FLONASE ) 50 MCG/ACT nasal spray Place 2 sprays into both nostrils daily.   methocarbamol  (ROBAXIN ) 500 MG tablet Take 1 tablet (500 mg total) by mouth 3 (three) times daily as needed.   ondansetron  (ZOFRAN -ODT) 4 MG disintegrating tablet Take 1 tablet (4 mg total) by mouth every 8 (eight) hours as needed for nausea or vomiting.   rosuvastatin  (CRESTOR ) 20 MG tablet Take 1 tablet (20 mg total) by mouth daily.   tadalafil  (CIALIS ) 20 MG tablet Take 1 tablet (20 mg total) by mouth every other day as needed for erectile dysfunction.   tirzepatide  (MOUNJARO ) 10 MG/0.5ML Pen Inject 10 mg into the skin once a week.   traZODone  (DESYREL ) 50 MG tablet Take 0.5-1 tablets (25-50 mg total) by mouth at bedtime as needed for sleep.   valsartan  (DIOVAN ) 320 MG tablet Take 1 tablet (320 mg total) by mouth daily.   [DISCONTINUED] predniSONE  (STERAPRED UNI-PAK 21 TAB) 10 MG (21) TBPK tablet Take by mouth daily. Take 6 tabs by mouth daily  for 2 days, then 5 tabs for 2 days, then 4 tabs for 2 days, then 3 tabs for 2 days, 2 tabs for 2 days, then 1 tab by mouth daily for 2 days   No facility-administered encounter medications on file as of  04/08/2024.    Allergies[1]  Pertinent ROS per HPI, otherwise unremarkable      Objective:  BP 128/77   Pulse 84   Temp 98 F (36.7 C)   Ht 6' 2 (1.88 m)   Wt 256 lb (116.1 kg)   SpO2 96%   BMI 32.87 kg/m    Wt Readings from Last 3 Encounters:  04/08/24 256 lb (116.1 kg)  01/16/24 250 lb (113.4 kg)  12/27/23 247 lb (112 kg)    Physical Exam Vitals and nursing note reviewed.  Constitutional:      General: He is not in acute distress.    Appearance: Normal appearance. He is well-developed and well-groomed. He is obese. He is not ill-appearing, toxic-appearing or diaphoretic.  HENT:     Head: Normocephalic and atraumatic.     Jaw: There is normal jaw occlusion.     Right Ear: Hearing normal.     Left Ear: Hearing normal.     Nose: Nose normal.     Mouth/Throat:     Lips: Pink.     Mouth: Mucous membranes are moist.     Pharynx: Uvula midline.  Eyes:     General: Lids are normal.     Pupils: Pupils are equal, round, and reactive to light.  Neck:     Trachea: Trachea and phonation normal.  Cardiovascular:     Rate and Rhythm: Normal rate and regular rhythm.     Chest Wall: PMI is not displaced.     Pulses: Normal pulses.     Heart sounds: Normal heart sounds. No murmur heard.    No friction rub. No gallop.  Pulmonary:     Effort: Pulmonary effort is normal. No respiratory distress.     Breath sounds: Normal breath sounds. No wheezing.  Musculoskeletal:        General:  Normal range of motion.     Cervical back: Neck supple.     Right lower leg: No edema.     Left lower leg: No edema.  Skin:    General: Skin is warm and dry.     Capillary Refill: Capillary refill takes less than 2 seconds.     Coloration: Skin is not cyanotic, jaundiced or pale.     Findings: No rash.  Neurological:     General: No focal deficit present.     Mental Status: He is alert and oriented to person, place, and time.     Sensory: Sensation is intact.     Motor: Motor function is  intact.     Coordination: Coordination is intact.     Gait: Gait is intact.     Deep Tendon Reflexes: Reflexes are normal and symmetric.  Psychiatric:        Attention and Perception: Attention and perception normal.        Mood and Affect: Mood and affect normal.        Speech: Speech normal.        Behavior: Behavior normal. Behavior is cooperative.        Thought Content: Thought content normal.        Cognition and Memory: Cognition and memory normal.        Judgment: Judgment normal.    Physical Exam   MEASUREMENTS: Weight- two fifties.        Results for orders placed or performed in visit on 01/16/24  Bayer DCA Hb A1c Waived   Collection Time: 01/16/24  9:04 AM  Result Value Ref Range   HB A1C (BAYER DCA - WAIVED) 5.1 4.8 - 5.6 %  Basic metabolic panel with GFR   Collection Time: 01/16/24  9:06 AM  Result Value Ref Range   Glucose 91 70 - 99 mg/dL   BUN 26 (H) 6 - 24 mg/dL   Creatinine, Ser 8.50 (H) 0.76 - 1.27 mg/dL   eGFR 55 (L) >40 fO/fpw/8.26   BUN/Creatinine Ratio 17 9 - 20   Sodium 138 134 - 144 mmol/L   Potassium 4.5 3.5 - 5.2 mmol/L   Chloride 102 96 - 106 mmol/L   CO2 22 20 - 29 mmol/L   Calcium  9.0 8.7 - 10.2 mg/dL       Pertinent labs & imaging results that were available during my care of the patient were reviewed by me and considered in my medical decision making.  Assessment & Plan:  Zachary [name] was seen today for medical management of chronic issues and trouble sleeping .  Diagnoses and all orders for this visit:  Type 2 diabetes mellitus with hyperglycemia, without long-term current use of insulin  (HCC) -     Bayer DCA Hb A1c Waived -     Basic metabolic panel with GFR  Hypertension associated with diabetes (HCC) -     Bayer DCA Hb A1c Waived -     Basic metabolic panel with GFR  CKD stage 3 due to type 2 diabetes mellitus (HCC) -     Bayer DCA Hb A1c Waived -     Basic metabolic panel with GFR  Hyperlipidemia associated with type 2  diabetes mellitus (HCC) -     Bayer DCA Hb A1c Waived -     Basic metabolic panel with GFR  Primary insomnia -     Basic metabolic panel with GFR -     traZODone  (DESYREL ) 50 MG tablet; Take  0.5-1 tablets (25-50 mg total) by mouth at bedtime as needed for sleep.  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 Concerns about melatonin's potential link to heart disease. Previous use of Restoril  and Ambien with undesirable side effects. Trazodone  and Atarax discussed as alternatives, with trazodone  being a suitable option. - Prescribed trazodone  50 mg, instructed to start with half a tablet and increase as needed up to 200 mg per night. - Sent prescription to [facility].  Type 2 diabetes mellitus with hyperglycemia, complicated by chronic kidney disease and stable proliferative diabetic retinopathy Type 2 diabetes is well-controlled with current medications, Farxiga  and Mounjaro . A1c has decreased from 5.6 to 5.4. No changes in urine output or vision. Proliferative diabetic retinopathy is stable, with follow-up with ophthalmologist planned. - Continue Farxiga  and Mounjaro . - Scheduled follow-up appointment with ophthalmologist [facility].  Hypertension associated with diabetes Blood pressure is well-controlled, consistently below 175 mmHg. No symptoms such as headaches or chest pain reported.          Continue all other maintenance medications.  Follow up plan: Return in about 3 months (around 07/07/2024), or if symptoms worsen or fail to improve, for HTN, DM.   Continue healthy lifestyle choices, including diet (rich in fruits, vegetables, and lean proteins, and low in salt and simple carbohydrates) and exercise (at least 30 minutes of moderate physical activity daily).  Educational handout given for  DM  The above assessment and management plan was discussed with the patient. The patient verbalized understanding of and has agreed to the management plan. Patient is aware to call the clinic if they develop any new symptoms or if symptoms persist or worsen. Patient is aware when to return to the clinic for a follow-up visit. Patient educated on when it is appropriate to go to the emergency department.   Rosaline Bruns, FNP-C [facility] [phone]     [1]  Allergies Allergen Reactions   Ace Inhibitors     REACTION: cough   Sulfa Antibiotics Other (See Comments)    unk   Sulfamethoxazole-Trimethoprim     REACTION: unspecified   "

## 2024-04-23 ENCOUNTER — Encounter: Payer: Self-pay | Admitting: Orthopaedic Surgery

## 2024-04-23 ENCOUNTER — Encounter: Payer: Self-pay | Admitting: Family Medicine

## 2024-04-23 ENCOUNTER — Ambulatory Visit: Admitting: Orthopaedic Surgery

## 2024-04-23 ENCOUNTER — Other Ambulatory Visit (INDEPENDENT_AMBULATORY_CARE_PROVIDER_SITE_OTHER): Payer: Self-pay

## 2024-04-23 DIAGNOSIS — M25531 Pain in right wrist: Secondary | ICD-10-CM

## 2024-04-23 NOTE — Progress Notes (Signed)
 Zachary [name] is well-known to me.  He has been dealing with right wrist pain for many years now.  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  We actually performed arthroscopic surgery remotely many years ago in his wrist and found arthritic changes and significant synovitis.  He says recently his wrist has become painful with range of motion.  He points medial and lateral as well as dorsal source of his pain.  On exam he does have pain over the TFCC of the right wrist.  There is pain over there is pain radially as well.  His palmar flexion and dorsiflexion are limited and there is significant pain on dorsiflexion of the right wrist.  X-rays of the right wrist today show cystic changes in the lunate but overall no malalignment the carpal bones but certainly worrisome findings.  This point a MRI arthrogram is warranted of the right wrist to rule out a TFCC tear and to assess the cartilage as well as the lunate.  We will then go from there in terms of treatment options and plans.  He understands this as well.  He knows we will be in touch.

## 2024-04-24 ENCOUNTER — Other Ambulatory Visit: Payer: Self-pay

## 2024-04-24 DIAGNOSIS — M25531 Pain in right wrist: Secondary | ICD-10-CM

## 2024-04-25 ENCOUNTER — Encounter: Payer: Self-pay | Admitting: Podiatry

## 2024-04-25 ENCOUNTER — Ambulatory Visit: Admitting: Podiatry

## 2024-04-25 DIAGNOSIS — B351 Tinea unguium: Secondary | ICD-10-CM | POA: Diagnosis not present

## 2024-04-25 DIAGNOSIS — E1165 Type 2 diabetes mellitus with hyperglycemia: Secondary | ICD-10-CM | POA: Diagnosis not present

## 2024-04-25 DIAGNOSIS — M79675 Pain in left toe(s): Secondary | ICD-10-CM

## 2024-04-25 DIAGNOSIS — M79674 Pain in right toe(s): Secondary | ICD-10-CM | POA: Diagnosis not present

## 2024-04-25 NOTE — Progress Notes (Signed)
"  °  Subjective:  Patient ID: Zachary [name], male    DOB: 1965-04-17,   MRN: [id number]  Chief Complaint  Patient presents with   Diabetes    She's going to take care of my toes.  I'm Diabetic.  Saw Severa Rock HERO, FNP - 04/08/2024    59 y.o. male presents for concern of thickened elongated and painful nails that are difficult to trim. Requesting to have them trimmed today. Denies  burning and tingling in their feet. Patient is diabetic and last A1c was  Lab Results  Component Value Date   HGBA1C 5.4 04/08/2024   .   PCP:  Severa Rock HERO, FNP    . Denies any other pedal complaints. Denies n/v/f/c.   Past Medical History:  Diagnosis Date   ADHD    Asthma    Binge eating disorder    Diabetes mellitus    GERD (gastroesophageal reflux disease)    Gout    Hyperlipidemia    Hypertension    Insomnia    Kidney stones    Low magnesium level    Vitamin D deficiency     Objective:  Physical Exam: Vascular: DP/PT pulses 2/4 bilateral. CFT <3 seconds. Absent hair growth on digits. Edema noted to bilateral lower extremities. Xerosis noted bilaterally.  Skin. No lacerations or abrasions bilateral feet. Nails 1-5 bilateral  are thickened discolored and elongated with subungual debris. Bilateral hallux nails healing well Musculoskeletal: MMT 5/5 bilateral lower extremities in DF, PF, Inversion and Eversion. Deceased ROM in DF of ankle joint.  Neurological: Sensation intact to light touch. Protective sensation slightly diminished bilateral.    Assessment:   1. Pain due to onychomycosis of toenails of both feet   2. Type 2 diabetes mellitus with hyperglycemia, without long-term current use of insulin  (HCC)      Plan:  Patient was evaluated and treated and all questions answered. -Discussed and educated patient on diabetic foot care, especially with  regards to the vascular, neurological and musculoskeletal systems.  -Stressed the importance of good glycemic control and the detriment  of not  controlling glucose levels in relation to the foot. -Discussed supportive shoes at all times and checking feet regularly.  -Mechanically debrided all nails 1-5 bilateral using sterile nail nipper and filed with dremel without incident  -Answered all patient questions -Patient to return  in 3 months for at risk foot care -Patient advised to call the office if any problems or questions arise in the meantime.   Asberry Failing, DPM    "

## 2024-05-14 ENCOUNTER — Other Ambulatory Visit

## 2024-05-16 ENCOUNTER — Ambulatory Visit: Admitting: Urology

## 2024-07-25 ENCOUNTER — Ambulatory Visit: Admitting: Podiatry

## 2024-07-25 ENCOUNTER — Ambulatory Visit: Admitting: Urology
